# Patient Record
Sex: Female | Born: 1995 | Race: Black or African American | Hispanic: No | Marital: Single | Smoking: Never smoker
Health system: Southern US, Community
[De-identification: ages and names within clinical notes are randomized; demographics above are authoritative.]

## PROBLEM LIST (undated history)

## (undated) ENCOUNTER — Inpatient Hospital Stay (HOSPITAL_COMMUNITY): Payer: Self-pay

## (undated) DIAGNOSIS — A749 Chlamydial infection, unspecified: Secondary | ICD-10-CM

## (undated) DIAGNOSIS — A599 Trichomoniasis, unspecified: Secondary | ICD-10-CM

## (undated) DIAGNOSIS — K661 Hemoperitoneum: Secondary | ICD-10-CM

## (undated) DIAGNOSIS — O1400 Mild to moderate pre-eclampsia, unspecified trimester: Secondary | ICD-10-CM

## (undated) DIAGNOSIS — O008 Other ectopic pregnancy without intrauterine pregnancy: Secondary | ICD-10-CM

## (undated) HISTORY — PX: SALPINGOOPHORECTOMY: SHX82

## (undated) HISTORY — DX: Mild to moderate pre-eclampsia, unspecified trimester: O14.00

## (undated) HISTORY — DX: Chlamydial infection, unspecified: A74.9

## (undated) HISTORY — DX: Other ectopic pregnancy without intrauterine pregnancy: O00.80

## (undated) HISTORY — DX: Trichomoniasis, unspecified: A59.9

---
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
## 2013-02-01 ENCOUNTER — Encounter (HOSPITAL_COMMUNITY): Payer: Self-pay | Admitting: Emergency Medicine

## 2013-02-01 ENCOUNTER — Emergency Department (HOSPITAL_COMMUNITY)
Admission: EM | Admit: 2013-02-01 | Discharge: 2013-02-01 | Disposition: A | Discharge status: Home / Self Care | Payer: Medicaid Other | Attending: Emergency Medicine | Admitting: Emergency Medicine

## 2013-02-01 ENCOUNTER — Emergency Department (HOSPITAL_COMMUNITY): Payer: Medicaid Other

## 2013-02-01 DIAGNOSIS — J069 Acute upper respiratory infection, unspecified: Secondary | ICD-10-CM

## 2013-02-01 DIAGNOSIS — R52 Pain, unspecified: Secondary | ICD-10-CM | POA: Insufficient documentation

## 2013-02-01 DIAGNOSIS — H9209 Otalgia, unspecified ear: Secondary | ICD-10-CM | POA: Insufficient documentation

## 2013-02-01 DIAGNOSIS — R000 Tachycardia, unspecified: Secondary | ICD-10-CM | POA: Insufficient documentation

## 2013-02-01 LAB — RAPID STREP SCREEN (MED CTR MEBANE ONLY): Streptococcus, Group A Screen (Direct): NEGATIVE

## 2013-02-01 MED ORDER — SODIUM CHLORIDE 0.9 % IV BOLUS (SEPSIS)
1000.0000 mL | Freq: Once | INTRAVENOUS | Status: AC
  Administered 2013-02-01: 1000 mL via INTRAVENOUS

## 2013-02-01 MED ORDER — IBUPROFEN 800 MG PO TABS
800.0000 mg | ORAL_TABLET | Freq: Once | ORAL | Status: AC
  Administered 2013-02-01: 800 mg via ORAL
  Filled 2013-02-01: qty 1

## 2013-02-01 NOTE — ED Provider Notes (Signed)
CSN: [id number]     Arrival date & time 02/01/13  1412 History   First MD Initiated Contact with Patient 02/01/13 1428     Chief Complaint  Patient presents with  . Sore Throat  . Otalgia  . Generalized Body Aches  . Chills   (Consider location/radiation/quality/duration/timing/severity/associated sxs/prior Treatment) Patient is a 17 y.o. female presenting with pharyngitis and ear pain. The history is provided by the patient and medical records.  Sore Throat Associated symptoms include chills, congestion, coughing, a fever and a sore throat.  Otalgia Associated symptoms: congestion, cough, fever and sore throat    This is an 17 year old female with no significant past medical history, presenting to the ED for her URI-like symptoms-- sore throat, productive cough, subjective fever, chills, generalized body aches, and bilateral otalgia for the past 3 days. Patient admits to recent sick contacts.  She denies any chest pain, shortness of breath, or palpitations. She denies any abdominal pain, nausea, or vomiting. No urinary symptoms.    Past Medical History  Diagnosis Date  . Pregnancy induced hypertension   . Mild preeclampsia 10/07/2012  . Insufficient prenatal care in third trimester 09/03/2012    Lapsed prenatal care. Records being transferred, got care at [facility], awaiting records Genetic Screen Will await records  Anatomic [place] Normal, HC<3rd%ile  Repeat:  Glucose Screen 79  GBS   Feeding Preference Bottle  Contraception Unsure  Circumcision      . NSVD (normal spontaneous vaginal delivery) 10/06/2012   History reviewed. No pertinent past surgical history. Family History  Problem Relation Age of Onset  . Cancer Maternal Grandmother     lung   History  Substance Use Topics  . Smoking status: Never Smoker   . Smokeless tobacco: Never Used  . Alcohol Use: No   OB History   Grav Para Term Preterm Abortions TAB SAB Ect Mult Living   2 1 1  0 1 0 1 0 0 1     Review of Systems   Constitutional: Positive for fever and chills.  HENT: Positive for congestion, ear pain and sore throat.   Respiratory: Positive for cough.   All other systems reviewed and are negative.    Allergies  Review of patient's allergies indicates no known allergies.  Home Medications  No current outpatient prescriptions on file. BP 123/79  Pulse 144  Temp(Src) 99.7 F (37.6 C) (Oral)  Resp 17  Ht 5\' 4"  (1.626 m)  Wt 90 lb (40.824 kg)  BMI 15.44 kg/m2  SpO2 100%  LMP 01/18/2013  Physical Exam  Nursing note and vitals reviewed. Constitutional: She is oriented to person, place, and time. She appears well-developed and well-nourished. No distress.  HENT:  Head: Normocephalic and atraumatic.  Right Ear: Tympanic membrane and ear canal normal.  Left Ear: Tympanic membrane and ear canal normal.  Nose: Mucosal edema present.  Mouth/Throat: Uvula is midline, oropharynx is clear and moist and mucous membranes are normal. No trismus in the jaw. No uvula swelling. No oropharyngeal exudate, posterior oropharyngeal edema, posterior oropharyngeal erythema or tonsillar abscesses.  Turbinates swollen and erythematous; tonsils 1+ bilaterally without exudate; uvula midline, no peritonsillar abscess; handling secretions appropriately, no difficulty swallowing  Eyes: Conjunctivae and EOM are normal. Pupils are equal, round, and reactive to light.  Neck: Normal range of motion. Neck supple.  Cardiovascular: Regular rhythm and normal heart sounds.  Tachycardia present.   Pulmonary/Chest: Effort normal and breath sounds normal. No respiratory distress. She has no wheezes.  Lungs CTAB  Abdominal: Soft. Bowel sounds are normal. There is no tenderness. There is no guarding.  Musculoskeletal: Normal range of motion.  Neurological: She is alert and oriented to person, place, and time.  Skin: Skin is warm and dry. She is not diaphoretic.  Psychiatric: She has a normal mood and affect.    ED Course   Procedures (including critical care time) Labs Review Labs Reviewed  RAPID STREP SCREEN   Imaging Review Dg Chest 2 View  02/01/2013   CLINICAL DATA:  Sore throat.  Ear pain.  Tachycardia.  EXAM: CHEST  2 VIEW  COMPARISON:  None.  FINDINGS: The heart size and mediastinal contours are within normal limits. The lungs are hyperinflated but clear. The visualized skeletal structures are unremarkable.  IMPRESSION: 1. No pneumonia. 2. Hyperinflation.   Electronically Signed   By: Signa Kell M.D.   On: 02/01/2013 16:25    EKG Interpretation   None       MDM   1. Viral URI with cough    Overall, pt non-toxic appearing.  Febrile and tachycardic on arrival, responsive to fluids and motrin.  Rapid strep negative.  CXR without acute disease.  Pts O2 sats have remained stable on room air.  Constellation of sx likely due to viral URI.  Instructed may use OTC cough suppressants as needed as well as chloraseptic spray for sore throat.  Drink plenty of fluids to keep self hydrated.  Return to the ED for new or worsening symptoms.  Garlon Hatchet, PA-C 02/01/13 1635

## 2013-02-01 NOTE — ED Provider Notes (Signed)
Medical screening examination/treatment/procedure(s) were performed by non-physician practitioner and as supervising physician I was immediately available for consultation/collaboration.  EKG Interpretation   None         Darlys Gales, MD 02/01/13 [phone]

## 2013-02-03 LAB — CULTURE, GROUP A STREP

## 2013-03-07 ENCOUNTER — Ambulatory Visit: Payer: Medicaid Other | Admitting: Pediatrics

## 2013-03-14 ENCOUNTER — Ambulatory Visit: Payer: Medicaid Other | Admitting: Pediatrics

## 2013-03-19 ENCOUNTER — Ambulatory Visit: Payer: Medicaid Other | Admitting: Pediatrics

## 2013-05-30 ENCOUNTER — Ambulatory Visit (INDEPENDENT_AMBULATORY_CARE_PROVIDER_SITE_OTHER): Payer: Medicaid Other | Admitting: Pediatrics

## 2013-05-30 ENCOUNTER — Encounter: Payer: Self-pay | Admitting: Pediatrics

## 2013-05-30 VITALS — BP 116/80 | HR 72 | Temp 98.7°F | Wt 86.4 lb

## 2013-05-30 DIAGNOSIS — A749 Chlamydial infection, unspecified: Secondary | ICD-10-CM

## 2013-05-30 DIAGNOSIS — Z113 Encounter for screening for infections with a predominantly sexual mode of transmission: Secondary | ICD-10-CM

## 2013-05-30 DIAGNOSIS — Z309 Encounter for contraceptive management, unspecified: Secondary | ICD-10-CM

## 2013-05-30 DIAGNOSIS — Z3202 Encounter for pregnancy test, result negative: Secondary | ICD-10-CM

## 2013-05-30 LAB — POCT URINE PREGNANCY: Preg Test, Ur: NEGATIVE

## 2013-05-30 MED ORDER — LEVONORGESTREL 0.75 MG PO TABS: ORAL_TABLET | ORAL | Status: DC

## 2013-05-30 NOTE — Progress Notes (Signed)
Subjective:     Patient ID: Brittney Holmes, female   DOB: May 13, 1995, 18 y.o.   MRN: [id number]  Possible Pregnancy This is a new problem. The current episode started in the past 7 days. Associated symptoms include a rash (arms, pubic area, and legs). Pertinent negatives include no abdominal pain, chills, coughing, fever or sore throat.   [name] [facility] She is worried that she may be pregnant and may have another STI.   Last sexual encounter was less than 1 week ago. Used protection, but condom broke. Not on birth control and has an infant. Does not want to be pregnant.   Has had gonorrhea and chlamydia in the last 6 months. Was treated in clinic. Did not have a test of cure.   Denies abnormal discharge and dyspareunia.   Last menses was 3-4 weeks ago.  Review of Systems  Constitutional: Negative for fever and chills.  HENT: Negative for sore throat.   Respiratory: Negative for cough.   Gastrointestinal: Negative for abdominal pain.  Genitourinary: Positive for genital sores. Negative for dysuria, frequency, hematuria, decreased urine volume, vaginal bleeding, vaginal discharge, difficulty urinating, vaginal pain, menstrual problem, pelvic pain and dyspareunia.  Skin: Positive for rash (arms, pubic area, and legs).  All other systems reviewed and are negative.       Objective:   Physical Exam  Nursing note and vitals reviewed. Constitutional: She is oriented to person, place, and time. She appears well-developed and well-nourished. No distress.  Exam performed with clinic volunteer as chaperone  HENT:  Head: Atraumatic.  Nose: Nose normal.  Mouth/Throat: No oropharyngeal exudate.  Eyes: Conjunctivae and EOM are normal.  Neck: Normal range of motion. Neck supple.  Cardiovascular: Normal rate and regular rhythm.   No murmur heard. Pulmonary/Chest: Effort normal and breath sounds normal. No respiratory distress.  Abdominal: Soft. Bowel sounds are  normal. She exhibits no distension.  Genitourinary: Vagina normal. No vaginal discharge found.  Shaved pubic hairs. Skin is dry. There are scattered flesh colored papules consistent with folliculitis, no obvious lesions, no odors  Musculoskeletal: Normal range of motion.  Lymphadenopathy:    She has no cervical adenopathy.  Neurological: She is alert and oriented to person, place, and time. No cranial nerve deficit. She exhibits normal muscle tone.  Skin: Skin is warm. Rash (there are few (<5) scattered papules on her thighs and several on her wrist) noted. No erythema.      Assessment and Plan:      1. Routine screening for STI (sexually transmitted infection) - HIV Antibody ( Reflex) - POCT urine pregnancy - negative - levonorgestrel (PLAN B) 0.75 MG tablet; Take two 0.75mg  tablets once after unprotected sex.  Dispense: 2 tablet; Refill: 1 - GC/chlamydia probe amp, urine - positive for chlamydia  2. Chlamydia infection - azithromycin 1g x 1 - please see telephone note for further details - [facility] and safer sex practices  3. Contraceptive management - scheduled follow up with Dr. Delorse LekMartha Perry for long-term birth control. Discussed options such as Nexplanon and provided with information - she would like Nexplanon.   Renne CriglerJalan W Jaemarie Hochberg MD, MPH, PGY-3

## 2013-05-30 NOTE — Patient Instructions (Signed)
You were seen for possible pregnancy. Your pregnancy test is negative. We are getting an HIV test and a urine gonorrhea and chlamydia test and will call you if your results are positive.   Please make sure to use a condom every time you have sex. I am prescribing Plan B - a pill that prevents pregnancy after you have sex. Take the pills as soon as possible after unprotected sex.   The small bumps you have in your pubic area are called folliculitis - it's best that folks with curly hair only trim but do not shave their pubic hair.   Folliculitis  Folliculitis is redness, soreness, and swelling (inflammation) of the hair follicles. This condition can occur anywhere on the body. People with weakened immune systems, diabetes, or obesity have a greater risk of getting folliculitis. CAUSES  Bacterial infection. This is the most common cause.  Fungal infection.  Viral infection.  Contact with certain chemicals, especially oils and tars. Long-term folliculitis can result from bacteria that live in the nostrils. The bacteria may trigger multiple outbreaks of folliculitis over time. SYMPTOMS Folliculitis most commonly occurs on the scalp, thighs, legs, back, buttocks, and areas where hair is shaved frequently. An early sign of folliculitis is a small, white or yellow, pus-filled, itchy lesion (pustule). These lesions appear on a red, inflamed follicle. They are usually less than 0.2 inches (5 mm) wide. When there is an infection of the follicle that goes deeper, it becomes a boil or furuncle. A group of closely packed boils creates a larger lesion (carbuncle). Carbuncles tend to occur in hairy, sweaty areas of the body. DIAGNOSIS  Your caregiver can usually tell what is wrong by doing a physical exam. A sample may be taken from one of the lesions and tested in a lab. This can help determine what is causing your folliculitis. TREATMENT  Treatment may include:  Applying warm compresses to the affected  areas.  Taking antibiotic medicines orally or applying them to the skin.  Draining the lesions if they contain a large amount of pus or fluid.  Laser hair removal for cases of long-lasting folliculitis. This helps to prevent regrowth of the hair. HOME CARE INSTRUCTIONS  Apply warm compresses to the affected areas as directed by your caregiver.  If antibiotics are prescribed, take them as directed. Finish them even if you start to feel better.  You may take over-the-counter medicines to relieve itching.  Do not shave irritated skin.  Follow up with your caregiver as directed. SEEK IMMEDIATE MEDICAL CARE IF:   You have increasing redness, swelling, or pain in the affected area.  You have a fever. MAKE SURE YOU:  Understand these instructions.  Will watch your condition.  Will get help right away if you are not doing well or get worse. Document Released: 04/25/2001 Document Revised: 08/16/2011 Document Reviewed: 05/17/2011 [facility] Patient Information 2014 [employer] [place].   Please see Dr. Delorse LekMartha Perry as soon as possible to have your Nexplanon inserted - it lasts for 3 years.

## 2013-05-31 ENCOUNTER — Encounter: Payer: Self-pay | Admitting: Pediatrics

## 2013-05-31 ENCOUNTER — Telehealth: Payer: Self-pay | Admitting: Pediatrics

## 2013-05-31 DIAGNOSIS — A749 Chlamydial infection, unspecified: Secondary | ICD-10-CM | POA: Insufficient documentation

## 2013-05-31 HISTORY — DX: Chlamydial infection, unspecified: A74.9

## 2013-05-31 LAB — GC/CHLAMYDIA PROBE AMP, URINE
CHLAMYDIA, SWAB/URINE, PCR: POSITIVE — AB
GC PROBE AMP, URINE: NEGATIVE

## 2013-05-31 MED ORDER — AZITHROMYCIN 200 MG/5ML PO SUSR: 1000.0000 mg | Freq: Once | ORAL | Status: DC

## 2013-05-31 MED ORDER — ONDANSETRON 8 MG PO TBDP: 8.0000 mg | ORAL_TABLET | Freq: Once | ORAL | Status: DC

## 2013-05-31 NOTE — Telephone Encounter (Signed)
I called Brittney Holmes about her positive chlamydia results. Her pregnancy test yesterday was negative.   She is feeling okay and opted to have home treatment. She states she cannot take pills and prefers oral solution.   1. Chlamydia - azithromycin (ZITHROMAX) 200 MG/5ML suspension; Take 25 mLs (1,000 mg total) by mouth once.  Dispense: 30 mL; Refill: 0 - ondansetron (ZOFRAN ODT) 8 MG disintegrating tablet; Take 1 tablet (8 mg total) by mouth once. Take prior to azithromycin.  Dispense: 1 tablet; Refill: 0 - sexual partner is 23yo. She will inform him of the need to be tested/ treated.  - no unprotected sex until partner is treated  She will practice taking pills. I encouraged her to start with M&Ms and icecream to help with pill-swallowing practice.   She has 4/24 follow up with Brittney Holmes.   Brittney CriglerJalan W Christohper Dube MD, MPH, PGY-3

## 2013-06-01 NOTE — Progress Notes (Signed)
I reviewed with the resident the medical history and the resident's findings on physical examination. I discussed with the resident the patient's diagnosis and agree with the treatment plan as documented in the resident's note.  Armanda Forand R, MD  

## 2013-06-21 ENCOUNTER — Encounter: Payer: Self-pay | Admitting: Pediatrics

## 2013-06-21 ENCOUNTER — Ambulatory Visit (INDEPENDENT_AMBULATORY_CARE_PROVIDER_SITE_OTHER): Payer: Medicaid Other | Admitting: Pediatrics

## 2013-06-21 VITALS — BP 110/78 | Ht 62.5 in | Wt 86.4 lb

## 2013-06-21 DIAGNOSIS — A749 Chlamydial infection, unspecified: Secondary | ICD-10-CM

## 2013-06-21 DIAGNOSIS — Z30017 Encounter for initial prescription of implantable subdermal contraceptive: Secondary | ICD-10-CM

## 2013-06-21 DIAGNOSIS — Z113 Encounter for screening for infections with a predominantly sexual mode of transmission: Secondary | ICD-10-CM

## 2013-06-21 NOTE — Patient Instructions (Signed)
Follow-up with Dr. Turon Kilmer in 1 month. Schedule this appointment before you leave clinic today.  Congratulations on getting your Nexplanon placement!  Below is some important information about Nexplanon.  First remember that Nexplanon does not prevent sexually transmitted infections.  Condoms will help prevent sexually transmitted infections. The Nexplanon starts working 7 days after it was inserted.  There is a risk of getting pregnant if you have unprotected sex in those first 7 days after placement of the Nexplanon.  The Nexplanon lasts for 3 years but can be removed at any time.  You can become pregnant as early as 1 week after removal.  You can have a new Nexplanon put in after the old one is removed if you like.  It is not known whether Nexplanon is as effective in women who are very overweight because the studies did not include many overweight women.  Nexplanon interacts with some medications, including barbiturates, bosentan, carbamazepine, felbamate, griseofulvin, oxcarbazepine, phenytoin, rifampin, St. John's wort, topiramate, HIV medicines.  Please alert your doctor if you are on any of these medicines.  Always tell other healthcare providers that you have a Nexplanon in your arm.  The Nexplanon was placed just under the skin.  Leave the outside bandage on for 24 hours.  Leave the smaller bandage on for 3-5 days or until it falls off on its own.  Keep the area clean and dry for 3-5 days. There is usually bruising or swelling at the insertion site for a few days to a week after placement.  If you see redness or pus draining from the insertion site, call us immediately.  Keep your user card with the date the implant was placed and the date the implant is to be removed.  The most common side effect is a change in your menstrual bleeding pattern.   This bleeding is generally not harmful to you but can be annoying.  Call or come in to see us if you have any concerns about the bleeding or if  you have any side effects or questions.    We will call you in 1 week to check in and we would like you to return to the clinic for a follow-up visit in 1 month.  You can call [facility] 24 hours a day with any questions or concerns.  There is always a nurse or doctor available to take your call.  Call 9-1-1 if you have a life-threatening emergency.  For anything else, please call us at [phone] before heading to the ER.  

## 2013-06-21 NOTE — Progress Notes (Signed)
Adolescent Medicine Consultation Follow-Up Visit Brittney Holmes  is a 18 y.o. female here today for follow-up of contraceptive management.   PCP Confirmed?  yes  Laurelle Skiver, [name], MD   History was provided by the patient.  Chart review:  Last seen by Dr. Marina GoodellPerry on 11/14/12.  Treatment plan at last visit included treatment for GC/CT, start nuvaring for birth control, f/u for clinic soon to encourage LARC use.   Patient's last menstrual period was 05/21/2013.  Last STI screen:  Component     Latest Ref Rng 05/30/2013  Chlamydia, Swab/Urine, PCR     NEGATIVE POSITIVE (A)  GC Probe Amp, Urine     NEGATIVE NEGATIVE   HPI:  Pt reports no concerns.  Reviewed positive chlamydia test 05/30/13, pt received treatment.  Pt denies any symptoms.  Pt interested in LARC placement today.  After discussion regarding options, pt will have Nexplanon placed.  Last unprotected sex:  1 month ago  ROS not indicated  Current Outpatient Prescriptions on File Prior to Visit  Medication Sig Dispense Refill  . azithromycin (ZITHROMAX) 200 MG/5ML suspension Take 25 mLs (1,000 mg total) by mouth once.  30 mL  0  . levonorgestrel (PLAN B) 0.75 MG tablet Take two 0.75mg  tablets once after unprotected sex.  2 tablet  1  . ondansetron (ZOFRAN ODT) 8 MG disintegrating tablet Take 1 tablet (8 mg total) by mouth once. Take prior to azithromycin.  1 tablet  0   No current facility-administered medications on file prior to visit.    No Known Allergies  Patient Active Problem List   Diagnosis Date Noted  . Chlamydia infection 05/31/2013  . Chlamydia 05/30/2013  . Contraceptive management 11/02/2012  . Anemia 10/08/2012   Physical Exam:  Filed Vitals:   06/21/13 1649  BP: 110/78  Height: 5' 2.5" (1.588 m)  Weight: 86 lb 6.4 oz (39.191 kg)   BP 110/78  Ht 5' 2.5" (1.588 m)  Wt 86 lb 6.4 oz (39.191 kg)  BMI 15.54 kg/m2  LMP 05/21/2013 Body mass index: body mass index is 15.54 kg/(m^2). 56.4% systolic and  91.1% diastolic of BP percentile by age, sex, and height. 126/81 is approximately the 95th BP percentile reading.  Nexplanon Insertion  No contraindications for placement.  No liver disease, no unexplained vaginal bleeding, no h/o breast cancer, no h/o blood clots.  Patient's last menstrual period was 05/21/2013.  UHCG: NEG  Risks & benefits of Nexplanon discussed The nexplanon device [facility]. Packaging instructions supplied to patient Consent form signed  The patient denies any allergies to anesthetics or antiseptics.  Procedure: Pt was placed in supine position. Left arm was flexed at the elbow and externally rotated so that her wrist was parallel to her ear The medial epicondyle of the left arm was identified The insertions site was marked 8 cm proximal to the medial epicondyle The insertion site was cleaned with Betadine The area surrounding the insertion site was covered with a sterile drape 1% lidocaine was injected just under the skin at the insertion site extending 4 cm proximally. The sterile preloaded disposable Nexaplanon applicator was removed from the sterile packaging The applicator needle was inserted at a 30 degree angle at 8 cm proximal to the medial epicondyle as marked The applicator was lowered to a horizontal position and advanced just under the skin for the full length of the needle The slider on the applicator was retracted fully while the applicator remained in the same position,  then the applicator was removed. The implant was confirmed via palpation as being in position The implant position was demonstrated to the patient Pressure dressing was applied to the patient.  The patient was instructed to removed the pressure dressing in 24 hrs.  The patient was advised to move slowly from a supine to an upright position  The patient denied any concerns or complaints  The patient was instructed to schedule a follow-up appt in 1  month and to call sooner if any concerns.  The patient acknowledged agreement and understanding of the plan.

## 2013-06-22 LAB — GC/CHLAMYDIA PROBE AMP, URINE
Chlamydia, Swab/Urine, PCR: POSITIVE — AB
GC Probe Amp, Urine: NEGATIVE

## 2013-06-24 ENCOUNTER — Telehealth: Payer: Self-pay | Admitting: Pediatrics

## 2013-06-24 NOTE — Telephone Encounter (Signed)
Sherri with [facility] need to speak to you or Dr.Perry regarding an order placed for this patient. She can be reached at [phone] option #2

## 2013-06-25 LAB — HIV ANTIBODY (ROUTINE TESTING W REFLEX)

## 2013-06-25 LAB — RPR

## 2013-06-25 NOTE — Telephone Encounter (Signed)
Patient did not go to the lab to have the HIV/RPR drawn but order was sent.  I advised them to cancel them and we will re-order upon re-check.

## 2013-06-28 ENCOUNTER — Ambulatory Visit: Payer: Medicaid Other | Admitting: Pediatrics

## 2013-07-09 NOTE — Progress Notes (Signed)
Quick Note:  Pt has not shown yet for treatment of positive STD. Please call and remind her of need to come for treatment. We can call the prescription in if she cannot come in. She must not have any sexual contact for 1 week after treatment to ensure she does not get re-infected and does not infect someone else. Her partner should be treated as well. We can add on medication for her partner to her prescription if needed. ______

## 2013-07-10 ENCOUNTER — Telehealth: Payer: Self-pay | Admitting: *Deleted

## 2013-07-10 NOTE — Telephone Encounter (Signed)
Message copied by Coralee RudKITTRELL, Kiran Lapine N on Wed Jul 10, 2013 12:06 PM ------      Message from: Delorse LekPERRY, MARTHA F      Created: Tue Jul 09, 2013  9:51 AM       Pt has not shown yet for treatment of positive STD.  Please call and remind her of need to come for treatment.  We can call the prescription in if she cannot come in.  She must not have any sexual contact for 1 week after treatment to ensure she does not get re-infected and does not infect someone else.  Her partner should be treated as well.  We can add on medication for her partner to her prescription if needed. ------

## 2013-07-10 NOTE — Telephone Encounter (Signed)
Left message for pt to call back to discuss treatment.

## 2013-07-16 NOTE — Progress Notes (Signed)
Pt. Has f/u appt scheduled for 07/23/13. Have called patient and LVM asking patient to return call to office.

## 2013-07-23 ENCOUNTER — Ambulatory Visit: Payer: Self-pay | Admitting: Pediatrics

## 2013-07-25 ENCOUNTER — Telehealth: Payer: Self-pay | Admitting: Pediatrics

## 2013-07-25 NOTE — Telephone Encounter (Signed)
Angie from the [facility] called yesterday and wanted to know the lab results of the sti, and also wanted to know if she was treated for the issue?? Can you please cakl her and let her know please... Thanks [phone]

## 2013-07-25 NOTE — Telephone Encounter (Signed)
Left message that patient has not been treated.  She has failed to show for 2 appointments made by her to obtain treatment and has not responded to any of our phone calls.   Advised to call back with further questions.

## 2013-08-17 NOTE — Telephone Encounter (Signed)
Please call patient and call health department to verify that patient has been treated.  We have not heard back any confirmation about treatment.

## 2013-08-19 NOTE — Telephone Encounter (Signed)
[facility] showed for treatment.

## 2013-10-16 ENCOUNTER — Ambulatory Visit (INDEPENDENT_AMBULATORY_CARE_PROVIDER_SITE_OTHER): Payer: Medicaid Other | Admitting: Pediatrics

## 2013-10-16 DIAGNOSIS — A749 Chlamydial infection, unspecified: Secondary | ICD-10-CM

## 2013-10-16 DIAGNOSIS — Z975 Presence of (intrauterine) contraceptive device: Secondary | ICD-10-CM

## 2013-10-16 MED ORDER — AZITHROMYCIN 250 MG PO TABS: 1000.0000 mg | ORAL_TABLET | Freq: Once | ORAL | Status: DC

## 2013-10-16 NOTE — Progress Notes (Signed)
Adolescent Medicine Consultation Follow-Up Visit Brittney Holmes  is a 18 y.o. female here today for follow-up of positive chlamydia test.   PCP Confirmed?  yes  Brittney Holmes, Brittney ClosMARTHA FAIRBANKS, MD   History was provided by the patient.  Chart review:  Last seen by Dr. Marina Holmes on 06/21/13.  Treatment plan at last visit included insertion of implant.  She had tested positive previously for chlamydia and had been treated.  She was retested for chlamydia on 06/21/13 and the retest was positive.  We were unable to contact the patient to get treated.  Today she presented for an appt for her child and was directed to us by Dr. Renae Holmes.  Last STI screen:  Component     Latest Ref Rng 06/21/2013  Chlamydia, Swab/Urine, PCR     NEGATIVE POSITIVE (A)  GC Probe Amp, Urine     NEGATIVE NEGATIVE   HPI:  Pt reports no concerns.  She reports she was unaware of the positive chlamydia test.  She denies dysuria or pelvic pain.  She reports last sexual contact was 1 month ago with a condom.  She does not have contact with that partner any more.  Discussed partner notification.  No LMP recorded.  ROS per HPI  The following portions of the patient's history were reviewed and updated as appropriate: allergies, current medications and problem list.  No Known Allergies  Physical Exam:  There were no vitals filed for this visit. There were no vitals taken for this visit. Body mass index: body mass index is unknown because there is no weight on file. No blood pressure reading on file for this encounter.  Physical Exam  Constitutional: No distress.  Abdominal: Soft. She exhibits no distension and no mass. There is no tenderness.  Musculoskeletal:       Arms:   Assessment/Plan: 1. Chlamydia infection Retreated today after positive test from several months ago.  Reviewed importance of more consistent medical care.  Pt has transportation issues.  Discussed Medicaid transportation options. - azithromycin (ZITHROMAX) tablet  1,000 mg; Take 4 tablets (1,000 mg total) by mouth once.  2. Presence of subdermal contraceptive implant No side effects.  Well healed.  Reviewed importance of condoms.   Follow-up:  3 months for retest of chlamydia  Medical decision-making:  > 15 minutes spent, more than 50% of appointment was spent discussing diagnosis and management of symptoms

## 2013-12-30 ENCOUNTER — Encounter: Payer: Self-pay | Admitting: Pediatrics

## 2014-01-14 ENCOUNTER — Encounter: Payer: Self-pay | Admitting: Pediatrics

## 2014-01-14 NOTE — Progress Notes (Signed)
Pre-Visit Planning  Previous Psych Screenings:  None Psych Screenings Due: None  Review of previous notes:  Last seen by Dr. Marina GoodellPerry on 10/16/13.  Treatment plan at last visit included continue nexplanon, retreatment of positive chlamydia test.   Last CPE: None  Last STI screen:  Component     Latest Ref Rng 06/21/2013  Chlamydia, Swab/Urine, PCR     NEGATIVE POSITIVE (A)  GC Probe Amp, Urine     NEGATIVE NEGATIVE   Pertinent Labs: None  Immunizations Due: FLU, HPV#1, VZV#2  To Do at visit:   - Schedule CPE - Check GC/CT - HIV, RPR - Assess nexplanon side effects

## 2014-01-15 ENCOUNTER — Ambulatory Visit: Payer: Medicaid Other | Admitting: Pediatrics

## 2014-02-11 ENCOUNTER — Ambulatory Visit (INDEPENDENT_AMBULATORY_CARE_PROVIDER_SITE_OTHER): Payer: Medicaid Other | Admitting: Pediatrics

## 2014-02-11 ENCOUNTER — Telehealth: Payer: Self-pay

## 2014-02-11 ENCOUNTER — Encounter: Payer: Self-pay | Admitting: Pediatrics

## 2014-02-11 VITALS — BP 96/68 | Wt 88.6 lb

## 2014-02-11 DIAGNOSIS — A749 Chlamydial infection, unspecified: Secondary | ICD-10-CM

## 2014-02-11 DIAGNOSIS — A599 Trichomoniasis, unspecified: Secondary | ICD-10-CM

## 2014-02-11 DIAGNOSIS — Z224 Carrier of infections with a predominantly sexual mode of transmission: Secondary | ICD-10-CM

## 2014-02-11 DIAGNOSIS — N898 Other specified noninflammatory disorders of vagina: Secondary | ICD-10-CM

## 2014-02-11 LAB — POCT URINE PREGNANCY: Preg Test, Ur: NEGATIVE

## 2014-02-11 MED ORDER — AZITHROMYCIN 250 MG PO TABS: 1000.0000 mg | ORAL_TABLET | Freq: Once | ORAL | Status: DC

## 2014-02-11 MED ORDER — AZITHROMYCIN 200 MG/5ML PO SUSR
1000.0000 mg | Freq: Once | ORAL | Status: AC
  Administered 2014-02-11: 1000 mg via ORAL

## 2014-02-11 MED ORDER — CEFTRIAXONE SODIUM 1 G IJ SOLR
250.0000 mg | Freq: Once | INTRAMUSCULAR | Status: AC
  Administered 2014-02-11: 250 mg via INTRAMUSCULAR

## 2014-02-11 NOTE — Progress Notes (Signed)
Patient ID: Brittney Holmes, female   DOB: 1995-06-10, 18 y.o.   MRN: [id number]  Subjective:    Brittney Holmes is a 18 y.o. old female with a history of previous chlamydia infection here for Vaginal Itching  HPI  Symptoms started period 2 days prior.  She began her menstrual cycle, wore a pad, started "irritating".  She noted a dry/red rash more toward the posterior side of the vaginal, associated with pruritis.  Green discharge started yesterday, smells bad.  She reports that these symptoms are similar to those that she had when she was diagnosed with chlamydia earlier this year.  No abdominal pain, fevers, dysuria, CVA pain.  No discomfort when wiping.  Applied vaseline, improved itching.  She is currently sexually active, uses nexplanon for birth control.   She was diagnosed with chlamydia by urine NAA in April 2015, was contacted but remained unaware of the positive test.  She was again seen in August 2015, at which time she again tested positive for chlamydia.  She was treated with 1 time dose of azithromycin 1000 mg.    Review of Systems  History and Problem List: Brittney Holmes has Anemia; Presence of subdermal contraceptive implant; and Chlamydia infection on her problem list.  Brittney Holmes  has a past medical history of Pregnancy induced hypertension; Mild preeclampsia (10/07/2012); Insufficient prenatal care in third trimester (09/03/2012); NSVD (normal spontaneous vaginal delivery) (10/06/2012); Vaginal discharge (11/02/2012); Maternal iron deficiency anemia complicating pregnancy in third trimester (10/05/2012); and Social problem (08/12/2012).   Meds: none  Allergies: none   Immunizations needed: none     Objective:     BP 96/68 mmHg  Wt 40.2 kg (88 lb 10 oz)  LMP 02/09/2014 Physical Exam GEN: awake and alert, no acute distress  HEENT:  Normocephalic, atraumatic. Sclera clear. PERRLA. EOMI. Nares clear. Oropharynx non erythematous without lesions or exudates. Moist mucous membranes.  SKIN: No rashes or  jaundice.  PULM:  Unlabored respirations.  Clear to auscultation bilaterally with no wheezes or crackles.  No accessory muscle use. CARDIO:  Regular rate and rhythm.  No murmurs.  2+ radial pulses GI:  Soft, non tender, non distended.  Normoactive bowel sounds.  No masses.  No hepatosplenomegaly.   GU: normal external genital exam, no frank discharge noted, no tenderness with vaginal swab EXT: Warm and well perfused. No cyanosis or edema.  NEURO: Alert and oriented. No obvious focal deficits.     Assessment and Plan:     Brittney Holmes was seen today for vaginal discharge and itching.  She has previously been diagnosed with chlamydia in April 2015.  Practitioners were unable to make contact with the patient again until August 2015, at which time she again tested positive for chlamydia and received treatment with Azithromycin.  She now presents with foul smelling vaginal discharge and itching, similar to her previous presentation with chlamydia.  She has no abdominal or pelvic pain. Given that she was previously lost to follow up for 4 months with untreated chlamydia, I will proceed with treating for gonorrhea and chlamydia in the clinic today.  Differential diagnosis also includes candidal infection, bacterial vaginosis, trichomonas.  Patient also needs screening for HIV and RPR (not obtained in the last year).  - Urine pregnancy, urine gonorrhea/chlamydia, wet prep genital swab - Serum RPR, HIV antibody - Will contact patient to arrange partner treatment once results are obtained  - Will transition to family practice, [facility] and placed her on waiting list for new patients  Eusebio MeSara Millisa Giarrusso, MD [facility], PGY-1  02/11/14        I saw and evaluated the patient, performing the key elements of the service. I developed the management plan that is described in the resident's note, and I agree with the content.   [facility] Center LLCNAGAPPAN,SURESH                  02/12/2014,  4:02 PM

## 2014-02-11 NOTE — Telephone Encounter (Signed)
A user error has taken place: encounter opened in error, closed for administrative reasons.

## 2014-02-11 NOTE — Patient Instructions (Signed)
We will contact you if any of our other testing is positive.  Your partners will need to be treated if testing for gonorrhea or chlamydia is positive.  Your partner can either go to the health department or we can call in treatment.    Please come back to clinic if you develop severe abdominal pain, fevers, back pain.   Chlamydia Chlamydia is an infection. It is spread through sexual contact. Chlamydia can be in different areas of the body. These areas include the cervix, urethra, throat, or rectum. You may not know you have chlamydia because many people never develop the symptoms. Chlamydia is not difficult to treat once you know you have it. However, if it is left untreated, chlamydia can lead to more serious health problems.  CAUSES  Chlamydia is caused by bacteria. It is a sexually transmitted disease. It is passed from an infected partner during intimate contact. This contact could be with the genitals, mouth, or rectal area. Chlamydia can also be passed from mothers to babies during birth. SIGNS AND SYMPTOMS  There may not be any symptoms. This is often the case early in the infection. If symptoms develop, they may include:  Mild pain and discomfort when urinating.  Redness, soreness, and swelling (inflammation) of the rectum.  Vaginal discharge.  Painful intercourse.  Abdominal pain.  Bleeding between menstrual periods. DIAGNOSIS  To diagnose this infection, your health care provider will do a pelvic exam. Cultures will be taken of the vagina, cervix, urine, and possibly the rectum to verify the diagnosis.  TREATMENT You will be given antibiotic medicines. If you are pregnant, certain types of antibiotics will need to be avoided. Any sexual partners should also be treated, even if they do not show symptoms.  HOME CARE INSTRUCTIONS   Take your antibiotic medicine as directed by your health care provider. Finish the antibiotic even if you start to feel better.  Take medicines only  as directed by your health care provider.  Inform any sexual partners about the infection. They should also be treated.  Do not have sexual contact until your health care provider tells you it is okay.  Get plenty of rest.  Eat a well-balanced diet.  Drink enough fluids to keep your urine clear or pale yellow.  Keep all follow-up visits as directed by your health care provider. SEEK MEDICAL CARE IF:  You have painful urination.  You have abdominal pain.  You have vaginal discharge.  You have painful sexual intercourse.  You have bleeding between periods and after sex.  You have a fever. SEEK IMMEDIATE MEDICAL CARE IF:   You experience nausea or vomiting.  You experience excessive sweating (diaphoresis).  You have difficulty swallowing. MAKE SURE YOU:   Understand these instructions.  Will watch your condition.  Will get help right away if you are not doing well or get worse. Document Released: 11/24/2004 Document Revised: 07/01/2013 Document Reviewed: 10/22/2012 [facility] Patient Information 2015 [place], [place]. This information is not intended to replace advice given to you by your health care provider. Make sure you discuss any questions you have with your health care provider.

## 2014-02-12 LAB — RPR

## 2014-02-12 LAB — HIV ANTIBODY (ROUTINE TESTING W REFLEX): HIV 1&2 Ab, 4th Generation: NONREACTIVE

## 2014-02-12 LAB — GC/CHLAMYDIA PROBE AMP, URINE
Chlamydia, Swab/Urine, PCR: POSITIVE — AB
GC Probe Amp, Urine: NEGATIVE

## 2014-02-13 ENCOUNTER — Telehealth: Payer: Self-pay | Admitting: Pediatrics

## 2014-02-13 LAB — WET PREP BY MOLECULAR PROBE
Candida species: NEGATIVE
Gardnerella vaginalis: NEGATIVE
Trichomonas vaginosis: POSITIVE — AB

## 2014-02-13 NOTE — Telephone Encounter (Signed)
Wet prep results show + trichomonas  Also, urine GC/chlamydia is positive for chlamydia  Patient was treated in clinic (directly observed) on 12/15 with 1 gram of azithromycin and 250 mg CTX which is sufficient for chlamydia/GC  She needs treatment for trichomonas. Attempted to call preferred home # with no answer. Will try to call again.

## 2014-02-14 ENCOUNTER — Encounter: Payer: Self-pay | Admitting: Pediatrics

## 2014-02-14 ENCOUNTER — Encounter: Payer: Self-pay | Admitting: Pediatric Endocrinology

## 2014-02-14 ENCOUNTER — Telehealth: Payer: Self-pay | Admitting: Pediatric Endocrinology

## 2014-02-14 DIAGNOSIS — A599 Trichomoniasis, unspecified: Secondary | ICD-10-CM | POA: Insufficient documentation

## 2014-02-14 HISTORY — DX: Trichomoniasis, unspecified: A59.9

## 2014-02-14 MED ORDER — METRONIDAZOLE 500 MG PO TABS: 2000.0000 mg | ORAL_TABLET | Freq: Once | ORAL | Status: DC

## 2014-02-14 NOTE — Addendum Note (Signed)
Addended byHenrietta Hoover: Shemiah Rosch on: 02/14/2014 03:00 PM   Modules accepted: Orders

## 2014-02-14 NOTE — Addendum Note (Signed)
Addended byHenrietta Hoover: Assad Harbeson on: 02/14/2014 02:11 PM   Modules accepted: Orders

## 2014-05-23 ENCOUNTER — Encounter (HOSPITAL_COMMUNITY): Payer: Self-pay | Admitting: *Deleted

## 2014-05-23 DIAGNOSIS — Z3202 Encounter for pregnancy test, result negative: Secondary | ICD-10-CM | POA: Diagnosis not present

## 2014-05-23 DIAGNOSIS — R1030 Lower abdominal pain, unspecified: Secondary | ICD-10-CM | POA: Diagnosis present

## 2014-05-23 DIAGNOSIS — N12 Tubulo-interstitial nephritis, not specified as acute or chronic: Secondary | ICD-10-CM | POA: Insufficient documentation

## 2014-05-23 DIAGNOSIS — Z8742 Personal history of other diseases of the female genital tract: Secondary | ICD-10-CM | POA: Diagnosis not present

## 2014-05-23 LAB — COMPREHENSIVE METABOLIC PANEL
ALK PHOS: 82 U/L (ref 39–117)
ALT: 9 U/L (ref 0–35)
AST: 21 U/L (ref 0–37)
Albumin: 4.5 g/dL (ref 3.5–5.2)
Anion gap: 8 (ref 5–15)
BILIRUBIN TOTAL: 2.2 mg/dL — AB (ref 0.3–1.2)
BUN: 9 mg/dL (ref 6–23)
CHLORIDE: 105 mmol/L (ref 96–112)
CO2: 24 mmol/L (ref 19–32)
Calcium: 10.2 mg/dL (ref 8.4–10.5)
Creatinine, Ser: 0.71 mg/dL (ref 0.50–1.10)
GFR calc Af Amer: >90 mL/min (ref >90)
GFR calc non Af Amer: >90 mL/min (ref >90)
Glucose, Bld: 94 mg/dL (ref 70–99)
Potassium: 3.9 mmol/L (ref 3.5–5.1)
SODIUM: 137 mmol/L (ref 135–145)
Total Protein: 8.4 g/dL — ABNORMAL HIGH (ref 6.0–8.3)

## 2014-05-23 LAB — CBC WITH DIFFERENTIAL/PLATELET
Basophils Absolute: 0.1 10*3/uL (ref 0.0–0.1)
Basophils Relative: 1 % (ref 0–1)
EOS ABS: 0.4 10*3/uL (ref 0.0–0.7)
EOS PCT: 3 % (ref 0–5)
HCT: 41.8 % (ref 36.0–46.0)
Hemoglobin: 13.6 g/dL (ref 12.0–15.0)
LYMPHS ABS: 2.1 10*3/uL (ref 0.7–4.0)
LYMPHS PCT: 15 % (ref 12–46)
MCH: 27.7 pg (ref 26.0–34.0)
MCHC: 32.5 g/dL (ref 30.0–36.0)
MCV: 85.1 fL (ref 78.0–100.0)
MONO ABS: 0.8 10*3/uL (ref 0.1–1.0)
Monocytes Relative: 6 % (ref 3–12)
NEUTROS PCT: 75 % (ref 43–77)
Neutro Abs: 10.4 10*3/uL — ABNORMAL HIGH (ref 1.7–7.7)
PLATELETS: 432 10*3/uL — AB (ref 150–400)
RBC: 4.91 MIL/uL (ref 3.87–5.11)
RDW: 12.6 % (ref 11.5–15.5)
WBC: 13.6 10*3/uL — ABNORMAL HIGH (ref 4.0–10.5)

## 2014-05-23 LAB — URINE MICROSCOPIC-ADD ON

## 2014-05-23 LAB — URINALYSIS, ROUTINE W REFLEX MICROSCOPIC
BILIRUBIN URINE: NEGATIVE
Glucose, UA: NEGATIVE mg/dL
Ketones, ur: 15 mg/dL — AB
NITRITE: POSITIVE — AB
Protein, ur: >300 mg/dL — AB
SPECIFIC GRAVITY, URINE: 1.023 (ref 1.005–1.030)
Urobilinogen, UA: 1 mg/dL (ref 0.0–1.0)
pH: 6 (ref 5.0–8.0)

## 2014-05-23 LAB — PREGNANCY, URINE: Preg Test, Ur: NEGATIVE

## 2014-05-23 LAB — LIPASE, BLOOD: LIPASE: 19 U/L (ref 11–59)

## 2014-05-23 NOTE — ED Notes (Signed)
The pt is c/o rt flank pain and lower abd pain for 2 days with painful urination no n v lmp none implant

## 2014-05-24 ENCOUNTER — Emergency Department (HOSPITAL_COMMUNITY)
Admission: EM | Admit: 2014-05-24 | Discharge: 2014-05-24 | Disposition: A | Discharge status: Home / Self Care | Payer: Medicaid Other | Attending: Emergency Medicine | Admitting: Emergency Medicine

## 2014-05-24 DIAGNOSIS — N12 Tubulo-interstitial nephritis, not specified as acute or chronic: Secondary | ICD-10-CM

## 2014-05-24 MED ORDER — CEFTRIAXONE SODIUM 1 G IJ SOLR
1.0000 g | Freq: Once | INTRAMUSCULAR | Status: AC
  Administered 2014-05-24: 1 g via INTRAVENOUS
  Filled 2014-05-24: qty 10

## 2014-05-24 MED ORDER — ONDANSETRON HCL 4 MG/2ML IJ SOLN
4.0000 mg | Freq: Once | INTRAMUSCULAR | Status: AC
  Administered 2014-05-24: 4 mg via INTRAVENOUS
  Filled 2014-05-24: qty 2

## 2014-05-24 MED ORDER — SODIUM CHLORIDE 0.9 % IV BOLUS (SEPSIS)
1000.0000 mL | Freq: Once | INTRAVENOUS | Status: AC
  Administered 2014-05-24: 1000 mL via INTRAVENOUS

## 2014-05-24 MED ORDER — MORPHINE SULFATE 4 MG/ML IJ SOLN
4.0000 mg | Freq: Once | INTRAMUSCULAR | Status: AC
  Administered 2014-05-24: 4 mg via INTRAVENOUS
  Filled 2014-05-24: qty 1

## 2014-05-24 MED ORDER — CEPHALEXIN 250 MG/5ML PO SUSR: 500.0000 mg | Freq: Two times a day (BID) | ORAL | Status: AC

## 2014-05-24 MED ORDER — ACETAMINOPHEN-CODEINE 120-12 MG/5ML PO SUSP: 10.0000 mL | Freq: Four times a day (QID) | ORAL | Status: DC | PRN

## 2014-05-24 MED ORDER — ONDANSETRON 4 MG PO TBDP: ORAL_TABLET | ORAL | Status: DC

## 2014-05-24 NOTE — ED Provider Notes (Signed)
CSN: [id number]     Arrival date & time 05/23/14  2143 History  This chart was scribed for Brittney Holmes Anvi Mangal, MD by Tanda RockersMargaux Venter, ED Scribe. This patient was seen in room B17C/B17C and the patient's care was started at 12:46 AM.    Chief Complaint  Patient presents with  . Abdominal Pain   The history is provided by the patient. No language interpreter was used.     HPI Comments: Brittney Holmes is a 19 y.o. female who presents to the Emergency Department complaining of gradual onset lower abdominal pain that began 2 days ago. Pt reports that the pain is radiating to the right flank. She also complains of hematuria that began 2 days ago as well. Pt also complains of dysuria and frequency. She denies nausea, vomiting, diarrhea, fever, chills, or any other symptoms. No vaginal symptoms   Past Medical History  Diagnosis Date  . Pregnancy induced hypertension   . Mild preeclampsia 10/07/2012  . Insufficient prenatal care in third trimester 09/03/2012    Lapsed prenatal care. Records being transferred, [facility], awaiting records Genetic Screen Will await records  Anatomic US Normal, HC<3rd%ile  Repeat:  Glucose Screen 79  GBS   Feeding Preference Bottle  Contraception Unsure  Circumcision      . NSVD (normal spontaneous vaginal delivery) 10/06/2012  . Vaginal discharge 11/02/2012  . Maternal iron deficiency anemia complicating pregnancy in third trimester 10/05/2012  . Social problem 08/12/2012    Homeless, she and mother temporarily living with FOB    History reviewed. No pertinent past surgical history. Family History  Problem Relation Age of Onset  . Cancer Maternal Grandmother     lung   History  Substance Use Topics  . Smoking status: Never Smoker   . Smokeless tobacco: Never Used  . Alcohol Use: No   OB History    Gravida Para Term Preterm AB TAB SAB Ectopic Multiple Living   2 1 1  0 1 0 1 0 0 1     Review of Systems  Constitutional: Negative for fever and chills.   Respiratory: Negative for cough and shortness of breath.   Cardiovascular: Negative for chest pain and leg swelling.  Gastrointestinal: Positive for abdominal pain. Negative for nausea, vomiting, diarrhea, constipation, blood in stool and anal bleeding.  Genitourinary: Positive for dysuria, frequency, hematuria and flank pain (Right flank). Negative for vaginal bleeding, vaginal discharge and pelvic pain.  Musculoskeletal: Positive for back pain. Negative for myalgias, neck pain and neck stiffness.  Skin: Negative for rash and wound.  Neurological: Negative for dizziness, weakness, light-headedness, numbness and headaches.  All other systems reviewed and are negative.     Allergies  Review of patient's allergies indicates no known allergies.  Home Medications   Prior to Admission medications   Medication Sig Start Date End Date Taking? Authorizing Provider  acetaminophen-codeine 120-12 MG/5ML suspension Take 10 mLs by mouth every 6 (six) hours as needed for pain. 05/24/14   Brittney Holmes Mikayla Chiusano, MD  cephALEXin (KEFLEX) 250 MG/5ML suspension Take 10 mLs (500 mg total) by mouth 2 (two) times daily. 05/24/14 06/10/14  Brittney Holmes Tahjae Durr, MD  metroNIDAZOLE (FLAGYL) 500 MG tablet Take 4 tablets (2,000 mg total) by mouth once. Patient not taking: Reported on 05/24/2014 02/14/14   Henrietta HooverSuresh Nagappan, MD  ondansetron [facility](ZOFRAN ODT) 4 MG disintegrating tablet 4mg  ODT q4 hours prn nausea/vomit 05/24/14   Brittney Holmes Amanada Philbrick, MD   Triage Vitals: BP 122/95 mmHg  Pulse 101  Temp(Src) 97.4 F (  36.3 C) (Oral)  Resp 18  Ht  (1.6 m)  Wt 83 lb 4.8 oz (37.785 kg)  BMI 14.76 kg/m2  SpO2 99%  LMP 05/23/2014   Physical Exam  Constitutional: She is oriented to person, place, and time. She appears well-developed and well-nourished. No distress.  HENT:  Head: Normocephalic and atraumatic.  Mouth/Throat: Oropharynx is clear and moist.  Eyes: EOM are normal. Pupils are equal, round, and reactive to light.  Neck:  Normal range of motion. Neck supple.  Cardiovascular: Normal rate and regular rhythm.   Pulmonary/Chest: Effort normal and breath sounds normal. No respiratory distress. She has no wheezes. She has no rales. She exhibits no tenderness.  Abdominal: Soft. Bowel sounds are normal. She exhibits no distension and no mass. There is tenderness (mild suprapubic tenderness with palpation.). There is no rebound and no guarding.  Musculoskeletal: Normal range of motion. She exhibits tenderness (right-sided CVA tenderness with percussion). She exhibits no edema.  Neurological: She is alert and oriented to person, place, and time.  Skin: Skin is warm and dry. No rash noted. No erythema.  Psychiatric: She has a normal mood and affect. Her behavior is normal.  Nursing note and vitals reviewed.   ED Course  Procedures (including critical care time)  DIAGNOSTIC STUDIES: Oxygen Saturation is 99% on RA, normal by my interpretation.    COORDINATION OF CARE: 12:49 AM-Discussed treatment plan which includes UA, CBC, CMP, Lipase, Urine pregnancy, UA, and prescription for antibiotics with pt at bedside and pt agreed to plan.   Labs Review Labs Reviewed  CBC WITH DIFFERENTIAL/PLATELET - Abnormal; Notable for the following:    WBC 13.6 (*)    Platelets 432 (*)    Neutro Abs 10.4 (*)    All other components within normal limits  COMPREHENSIVE METABOLIC PANEL - Abnormal; Notable for the following:    Total Protein 8.4 (*)    Total Bilirubin 2.2 (*)    All other components within normal limits  URINALYSIS, ROUTINE W REFLEX MICROSCOPIC - Abnormal; Notable for the following:    Color, Urine AMBER (*)    APPearance TURBID (*)    Hgb urine dipstick LARGE (*)    Ketones, ur 15 (*)    Protein, ur >300 (*)    Nitrite POSITIVE (*)    Leukocytes, UA MODERATE (*)    All other components within normal limits  URINE MICROSCOPIC-ADD ON - Abnormal; Notable for the following:    Bacteria, UA MANY (*)    All other  components within normal limits  LIPASE, BLOOD  PREGNANCY, URINE    Imaging Review No results found.   EKG Interpretation None      MDM   Final diagnoses:  Pyelonephritis    I personally performed the services described in this documentation, which was scribed in my presence. The recorded information has been reviewed and is accurate.  Currently the patient has a pyelonephritis. She is well-appearing with normal vital signs. We'll give IV Rocephin and IV fluids in the emergency department. Anticipate discharge home with PO antibiotics.     Brittney Racer, MD 05/24/14 [phone]

## 2014-05-24 NOTE — Discharge Instructions (Signed)
Pyelonephritis, Adult °Pyelonephritis is a kidney infection. In general, there are 2 main types of pyelonephritis: °· Infections that come on quickly without any warning (acute pyelonephritis). °· Infections that persist for a long period of time (chronic pyelonephritis). °CAUSES  °Two main causes of pyelonephritis are: °· Bacteria traveling from the bladder to the kidney. This is a problem especially in pregnant women. The urine in the bladder can become filled with bacteria from multiple causes, including: °¨ Inflammation of the prostate gland (prostatitis). °¨ Sexual intercourse in females. °¨ Bladder infection (cystitis). °· Bacteria traveling from the bloodstream to the tissue part of the kidney. °Problems that may increase your risk of getting a kidney infection include: °· Diabetes. °· Kidney stones or bladder stones. °· Cancer. °· Catheters placed in the bladder. °· Other abnormalities of the kidney or ureter. °SYMPTOMS  °· Abdominal pain. °· Pain in the side or flank area. °· Fever. °· Chills. °· Upset stomach. °· Blood in the urine (dark urine). °· Frequent urination. °· Strong or persistent urge to urinate. °· Burning or stinging when urinating. °DIAGNOSIS  °Your caregiver may diagnose your kidney infection based on your symptoms. A urine sample may also be taken. °TREATMENT  °In general, treatment depends on how severe the infection is.  °· If the infection is mild and caught early, your caregiver may treat you with oral antibiotics and send you home. °· If the infection is more severe, the bacteria may have gotten into the bloodstream. This will require intravenous (IV) antibiotics and a hospital stay. Symptoms may include: °¨ High fever. °¨ Severe flank pain. °¨ Shaking chills. °· Even after a hospital stay, your caregiver may require you to be on oral antibiotics for a period of time. °· Other treatments may be required depending upon the cause of the infection. °HOME CARE INSTRUCTIONS  °· Take your  antibiotics as directed. Finish them even if you start to feel better. °· Make an appointment to have your urine checked to make sure the infection is gone. °· Drink enough fluids to keep your urine clear or pale yellow. °· Take medicines for the bladder if you have urgency and frequency of urination as directed by your caregiver. °SEEK IMMEDIATE MEDICAL CARE IF:  °· You have a fever or persistent symptoms for more than 2-3 days. °· You have a fever and your symptoms suddenly get worse. °· You are unable to take your antibiotics or fluids. °· You develop shaking chills. °· You experience extreme weakness or fainting. °· There is no improvement after 2 days of treatment. °MAKE SURE YOU: °· Understand these instructions. °· Will watch your condition. °· Will get help right away if you are not doing well or get worse. °Document Released: 02/14/2005 Document Revised: 08/16/2011 Document Reviewed: 07/21/2010 °[employer]® Patient Information ©2015 [employer]. This information is not intended to replace advice given to you by your health care provider. Make sure you discuss any questions you have with your health care provider. ° °

## 2014-07-22 ENCOUNTER — Ambulatory Visit: Payer: Medicaid Other | Admitting: Pediatrics

## 2014-08-21 ENCOUNTER — Ambulatory Visit: Payer: Medicaid Other | Admitting: Pediatrics

## 2014-09-30 ENCOUNTER — Ambulatory Visit: Payer: Medicaid Other | Admitting: Pediatrics

## 2015-01-09 NOTE — Telephone Encounter (Signed)
Entered in error

## 2016-02-16 ENCOUNTER — Ambulatory Visit: Payer: Medicaid Other | Admitting: Obstetrics and Gynecology

## 2016-03-15 ENCOUNTER — Encounter: Payer: Self-pay | Admitting: Student in an Organized Health Care Education/Training Program

## 2016-03-15 ENCOUNTER — Other Ambulatory Visit (HOSPITAL_COMMUNITY)
Admission: RE | Admit: 2016-03-15 | Discharge: 2016-03-15 | Disposition: A | Discharge status: Home / Self Care | Payer: Medicaid Other | Source: Ambulatory Visit | Attending: Family Medicine | Admitting: Family Medicine

## 2016-03-15 ENCOUNTER — Ambulatory Visit (INDEPENDENT_AMBULATORY_CARE_PROVIDER_SITE_OTHER): Payer: Medicaid Other | Admitting: Student in an Organized Health Care Education/Training Program

## 2016-03-15 VITALS — BP 100/80 | HR 96 | Temp 98.2°F | Ht 63.0 in | Wt 93.0 lb

## 2016-03-15 DIAGNOSIS — B9689 Other specified bacterial agents as the cause of diseases classified elsewhere: Secondary | ICD-10-CM

## 2016-03-15 DIAGNOSIS — Z113 Encounter for screening for infections with a predominantly sexual mode of transmission: Secondary | ICD-10-CM | POA: Diagnosis not present

## 2016-03-15 DIAGNOSIS — Z124 Encounter for screening for malignant neoplasm of cervix: Secondary | ICD-10-CM | POA: Diagnosis not present

## 2016-03-15 DIAGNOSIS — Z0000 Encounter for general adult medical examination without abnormal findings: Secondary | ICD-10-CM

## 2016-03-15 DIAGNOSIS — N760 Acute vaginitis: Secondary | ICD-10-CM | POA: Diagnosis not present

## 2016-03-15 LAB — POCT WET PREP (WET MOUNT)
CLUE CELLS WET PREP WHIFF POC: NEGATIVE
TRICHOMONAS WET PREP HPF POC: ABSENT

## 2016-03-15 MED ORDER — METRONIDAZOLE 500 MG PO TABS: 500.0000 mg | ORAL_TABLET | Freq: Two times a day (BID) | ORAL | 0 refills | Status: DC

## 2016-03-15 MED ORDER — METRONIDAZOLE 0.75 % VA GEL
1.0000 | Freq: Two times a day (BID) | VAGINAL | 0 refills | Status: AC
Start: 2016-03-15 — End: 2016-03-20

## 2016-03-15 NOTE — Assessment & Plan Note (Signed)
-   Due for PAP, erformed today - Patient would like Nexplanon removed, will have her schedule for procedures clinic - Recommend inserting new nexplanon at that time - patient will discuss with her mom and decide.

## 2016-03-15 NOTE — Assessment & Plan Note (Signed)
Patient currently asymptomatic, sexually active with one female partner and does not use barrier protection - Will do HIV, RPR, wet prep, GC/Chla today - Contraception counseling, particular use of condoms discussed at today's visit

## 2016-03-15 NOTE — Patient Instructions (Addendum)
It was a pleasure meeting you today in our clinic. Today we discussed birth control and STI screening. Here is the treatment plan we have discussed and agreed upon together:  - Please schedule an appointment in our procedures clinic on your way out of the office today for Nexplanon removal.  I would recommend placing another nexplanon at the time that it is removed. - We will test for STI's today, and I will call you with the results of these tests.  Bacterial Vaginosis - Please take the antibiotic twice daily for the next seven days.  Our clinic's number is [phone]. Please call with questions or concerns about what we discussed today.  Be well, Dr. Mosetta PuttFeng

## 2016-03-15 NOTE — Progress Notes (Signed)
   CC: STD screening  HPI: Brittney Holmes is a 21 y.o. female who [facility], asking for STI screening and Nexplanon removal because it is 21 years old. She would like her nexplanon removed because it is 21 years old.  STD check - one female partner - nexplanon for birth control - does not use condoms - no pruritis, no burning, +foul smelling discharge - no dysuria, no hematuria, no changes in urination  Foul Smelling Vaginal Discharge - no associated pruritis or burning as noted above - noticed x1 week - no fevers, no N/V/D/C  Health maintenance: Due for PAP, flu vaccine  Review of Symptoms:  See HPI for ROS.    Social History: Lives with her mother and 23 year old daughter  CC, SH/smoking status, and VS noted.  Objective: BP 100/80 (BP Location: Right Arm, Patient Position: Sitting, Cuff Size: Small)   Pulse 96   Temp 98.2 F (36.8 C) (Oral)   Ht 5\' 3"  (1.6 m)   Wt 93 lb (42.2 kg)   LMP 01/14/2016 (Approximate)   SpO2 97%   BMI 16.47 kg/m  GEN: NAD, alert, cooperative, and pleasant. EYE: no conjunctival injection, pupils equally round and reactive to light ENMT: normal tympanic light reflex, no nasal polyps,no rhinorrhea, no pharyngeal erythema or exudates NECK: full ROM, no thyromegaly RESPIRATORY: clear to auscultation bilaterally with no wheezes, rhonchi or rales, good effort CV: RRR, no m/r/g, no peripheral edema GI: soft, non-tender, non-distended, normoactive bowel sounds, no hepatosplenomegaly GU: external vagina without rash or lesion, cervix normal-appearing with small amt of white discharge SKIN: numerous tattoos. warm and dry, no rashes or lesions NEURO: II-XII grossly intact PSYCH: AAOx3, appropriate affect  Flu Vaccine: Refuses  Wet prep: Moderate bacteria, few clue cells, no trich, neg whiff  Assessment and plan:  Routine screening for STI (sexually transmitted infection) Patient currently asymptomatic, sexually active with  one female partner and does not use barrier protection - Will do HIV, RPR, wet prep, GC/Chla today - Contraception counseling, particular use of condoms discussed at today's visit  Healthcare maintenance - Due for PAP, erformed today - Patient would like Nexplanon removed, will have her schedule for procedures clinic - Recommend inserting new nexplanon at that time - patient will discuss with her mom and decide.  BV (bacterial vaginosis) Diagnosed with wet prep in office.  - metronidazole intravaginal application BID for 5 days (patient refuses to take pills) - return precautions provided   Orders Placed This Encounter  Procedures  . HIV antibody  . RPR  . POCT Wet Prep [facility] [place])     Howard PouchLauren Marti Acebo, MD,MS,  PGY1 03/15/2016 5:17 PM

## 2016-03-15 NOTE — Assessment & Plan Note (Signed)
Diagnosed with wet prep in office.  - metronidazole intravaginal application BID for 5 days (patient refuses to take pills) - return precautions provided

## 2016-03-16 LAB — RPR

## 2016-03-16 LAB — HIV ANTIBODY (ROUTINE TESTING W REFLEX): HIV 1&2 Ab, 4th Generation: NONREACTIVE

## 2016-03-18 ENCOUNTER — Telehealth: Payer: Self-pay | Admitting: Student in an Organized Health Care Education/Training Program

## 2016-03-18 ENCOUNTER — Ambulatory Visit: Payer: Medicaid Other | Admitting: Student in an Organized Health Care Education/Training Program

## 2016-03-18 ENCOUNTER — Other Ambulatory Visit (HOSPITAL_COMMUNITY)
Admission: RE | Admit: 2016-03-18 | Discharge: 2016-03-18 | Disposition: A | Discharge status: Home / Self Care | Payer: Medicaid Other | Source: Ambulatory Visit | Attending: Family Medicine | Admitting: Family Medicine

## 2016-03-18 DIAGNOSIS — Z01419 Encounter for gynecological examination (general) (routine) without abnormal findings: Secondary | ICD-10-CM | POA: Insufficient documentation

## 2016-03-18 DIAGNOSIS — Z1151 Encounter for screening for human papillomavirus (HPV): Secondary | ICD-10-CM | POA: Diagnosis present

## 2016-03-18 NOTE — Addendum Note (Signed)
Addended by: Lamonte SakaiZIMMERMAN RUMPLE, Pema Thomure D on: 03/18/2016 04:33 PM   Modules accepted: Orders

## 2016-03-18 NOTE — Telephone Encounter (Signed)
Spoke with patient and rescheduled her appointment for procedures clinic.  Shanda BumpsJessica will cancel her appt for this morning.

## 2016-03-21 LAB — CERVICOVAGINAL ANCILLARY ONLY
Chlamydia: NEGATIVE
Neisseria Gonorrhea: NEGATIVE

## 2016-03-21 LAB — CYTOLOGY - PAP
Adequacy: ABSENT
Diagnosis: NEGATIVE
HPV (WINDOPATH): DETECTED — AB

## 2016-03-23 ENCOUNTER — Telehealth: Payer: Self-pay | Admitting: Student in an Organized Health Care Education/Training Program

## 2016-03-23 NOTE — Telephone Encounter (Signed)
Called and spoke with patient regarding STI screening results (all normal) and her Pap results.  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  Normally HPV would not be tested in her age group and the positive HPV result puts her outside the ACOG algorithm.  - I discussed this case with our attending, Dr. Jennette KettleNeal who suggested that we may continue routine screening given her age group and likelihood that she will clear the HPV virus normally, so we would do her next PAP in 3 years.   - I explained all of this to the patient and let her know we can either screen again in one year, or proceed with normal routine screening and do her next Pap in 3 years.  Patient is agreeable to doing her next Pap in 3 years.

## 2016-03-24 ENCOUNTER — Encounter (HOSPITAL_COMMUNITY): Payer: Self-pay

## 2016-03-24 ENCOUNTER — Emergency Department (HOSPITAL_COMMUNITY): Payer: Medicaid Other

## 2016-03-24 ENCOUNTER — Emergency Department (HOSPITAL_COMMUNITY)
Admission: EM | Admit: 2016-03-24 | Discharge: 2016-03-24 | Disposition: A | Discharge status: Home / Self Care | Payer: Medicaid Other | Attending: Emergency Medicine | Admitting: Emergency Medicine

## 2016-03-24 DIAGNOSIS — R0600 Dyspnea, unspecified: Secondary | ICD-10-CM

## 2016-03-24 DIAGNOSIS — R000 Tachycardia, unspecified: Secondary | ICD-10-CM | POA: Insufficient documentation

## 2016-03-24 DIAGNOSIS — R0602 Shortness of breath: Secondary | ICD-10-CM | POA: Insufficient documentation

## 2016-03-24 LAB — CBC
HEMATOCRIT: 35.7 % — AB (ref 36.0–46.0)
Hemoglobin: 11.8 g/dL — ABNORMAL LOW (ref 12.0–15.0)
MCH: 28.2 pg (ref 26.0–34.0)
MCHC: 33.1 g/dL (ref 30.0–36.0)
MCV: 85.4 fL (ref 78.0–100.0)
Platelets: 429 10*3/uL — ABNORMAL HIGH (ref 150–400)
RBC: 4.18 MIL/uL (ref 3.87–5.11)
RDW: 12.8 % (ref 11.5–15.5)
WBC: 11.1 10*3/uL — ABNORMAL HIGH (ref 4.0–10.5)

## 2016-03-24 LAB — BASIC METABOLIC PANEL
Anion gap: 12 (ref 5–15)
BUN: 6 mg/dL (ref 6–20)
CO2: 18 mmol/L — ABNORMAL LOW (ref 22–32)
CREATININE: 0.65 mg/dL (ref 0.44–1.00)
Calcium: 9.5 mg/dL (ref 8.9–10.3)
Chloride: 105 mmol/L (ref 101–111)
GFR calc Af Amer: >60 mL/min (ref >60)
GFR calc non Af Amer: >60 mL/min (ref >60)
GLUCOSE: 122 mg/dL — AB (ref 65–99)
POTASSIUM: 3.3 mmol/L — AB (ref 3.5–5.1)
Sodium: 135 mmol/L (ref 135–145)

## 2016-03-24 LAB — D-DIMER, QUANTITATIVE: D-Dimer, Quant: <0.27 ug/mL-FEU (ref 0.00–0.50)

## 2016-03-24 LAB — I-STAT TROPONIN, ED: Troponin i, poc: 0 ng/mL (ref 0.00–0.08)

## 2016-03-24 MED ORDER — METOPROLOL TARTRATE 5 MG/5ML IV SOLN
2.5000 mg | Freq: Once | INTRAVENOUS | Status: AC
  Administered 2016-03-24: 2.5 mg via INTRAVENOUS
  Filled 2016-03-24: qty 5

## 2016-03-24 MED ORDER — METOPROLOL TARTRATE 25 MG PO TABS: 25.0000 mg | ORAL_TABLET | Freq: Once | ORAL | Status: DC

## 2016-03-24 NOTE — ED Triage Notes (Signed)
Sob and resp distress per ems, sts had runs of  Rapid heart rate, anxiety noted.

## 2016-03-24 NOTE — Discharge Instructions (Signed)
Your evaluation did not show any problems with your lungs or heart. The exact cause for why you were feeling short of breath is not clear, but it is not anything serious.

## 2016-03-24 NOTE — ED Triage Notes (Signed)
Pt reporst stress and started smoking weed a week ago and then stopped 3 days ago. sts feeling dizzy when steanding up. Ems placed 18 g iv

## 2016-03-24 NOTE — ED Provider Notes (Signed)
[facility]-EM[facility] DEPT Provider Note   CSN: [id number] Arrival date & time: 03/24/16  0121     History   Chief Complaint Chief Complaint  Patient presents with  . Shortness of Breath    HPI Brittney Holmes is a 21 y.o. female.  She states that she has been using marijuana for the last week. During this time, she has had episodic difficulty breathing. She gets very anxious when this occurs. She denies chest pain, heaviness, tightness, pressure. There is no cough, fever, chills, sweats. She denies palpitations. Nothing makes symptoms better or worse She denies any recent surgery or travel, but she does have a contraceptive implant.   The history is provided by the patient.  Shortness of Breath     Past Medical History:  Diagnosis Date  . Chlamydia infection 05/31/2013  . Insufficient prenatal care in third trimester 09/03/2012   Lapsed prenatal care. Records being transferred, got care at [facility] country, awaiting records Genetic Screen Will await records  Anatomic [place] Normal, HC<3rd%ile  Repeat:  Glucose Screen 79  GBS   Feeding Preference Bottle  Contraception Unsure  Circumcision      . Maternal iron deficiency anemia complicating pregnancy in third trimester 10/05/2012  . Mild preeclampsia 10/07/2012  . NSVD (normal spontaneous vaginal delivery) 10/06/2012  . Pregnancy induced hypertension   . Social problem 08/12/2012   Homeless, she and mother temporarily living with FOB   . Trichomonas infection 02/14/2014  . Vaginal discharge 11/02/2012    Patient Active Problem List   Diagnosis Date Noted  . Routine screening for STI (sexually transmitted infection) 03/15/2016  . Healthcare maintenance 03/15/2016  . BV (bacterial vaginosis) 03/15/2016  . Presence of subdermal contraceptive implant 11/02/2012  . Anemia 10/08/2012    History reviewed. No pertinent surgical history.  OB History    Gravida Para Term Preterm AB Living   2 1 1  0 1 1   SAB TAB Ectopic Multiple Live Births   1 0 0  0 1       Home Medications    Prior to Admission medications   Not on File    Family History Family History  Problem Relation Age of Onset  . Cancer Maternal Grandmother     lung    Social History Social History  Substance Use Topics  . Smoking status: Never Smoker  . Smokeless tobacco: Never Used  . Alcohol use No     Allergies   Patient has no known allergies.   Review of Systems Review of Systems  Respiratory: Positive for shortness of breath.   All other systems reviewed and are negative.    Physical Exam Updated Vital Signs BP 109/83   Pulse 85   Temp 97.8 F (36.6 C) (Oral)   Resp 16   SpO2 99%   Physical Exam  Nursing note and vitals reviewed.  21 year old female, resting comfortably and in no acute distress. Vital signs are normal, but she is demonstrating significant tachycardia while I am in the room with her-heart rate up to 145. Oxygen saturation is 99%, which is normal. Head is normocephalic and atraumatic. PERRLA, EOMI. Oropharynx is clear. Neck is nontender and supple without adenopathy or JVD. Back is nontender and there is no CVA tenderness. Lungs are clear without rales, wheezes, or rhonchi. Chest is nontender. Heart is tachycardic without murmur. Abdomen is soft, flat, nontender without masses or hepatosplenomegaly and peristalsis is normoactive. Extremities have no cyanosis or edema, full range of motion is present.  Skin is warm and dry without rash. Neurologic: Mental status is normal, cranial nerves are intact, there are no motor or sensory deficits.  ED Treatments / Results  Labs (all labs ordered are listed, but only abnormal results are displayed) Labs Reviewed  BASIC METABOLIC PANEL - Abnormal; Notable for the following:       Result Value   Potassium 3.3 (*)    CO2 18 (*)    Glucose, Bld 122 (*)    All other components within normal limits  CBC - Abnormal; Notable for the following:    WBC 11.1 (*)    Hemoglobin 11.8  (*)    HCT 35.7 (*)    Platelets 429 (*)    All other components within normal limits  D-DIMER, QUANTITATIVE (NOT AT [facility])  Rosezena SensorI-STAT TROPOININ, ED    EKG Henderson BaltimoreLEWIS, Rashunda JX:[id number] 24-Mar-2016 01:27:12 [facility] ROUTINE RECORD Sinus tachycardia Possible Left atrial enlargement Septal infarct , age undetermined Abnormal ECG 2325mm/s 1210mm/mV 100Hz  9.0.4 12SL 241 HD CID: 46 Unconfirmed Vent. rate 109 BPM PR interval 140 ms QRS duration 78 ms QT/QTc 306/412 ms P-R-T axes 82 88 58 10-Feb-1996 (20 yr) Female Black Room: Loc:11 Technician: [id number]  Radiology Dg Chest 2 View  Result Date: 03/24/2016 CLINICAL DATA:  Initial evaluation for acute shortness of breath. EXAM: CHEST  2 VIEW COMPARISON:  Prior radiograph from 02/01/2013. FINDINGS: The cardiac and mediastinal silhouettes are stable in size and contour, and remain within normal limits. The lungs are mildly hyperinflated. No airspace consolidation, pleural effusion, or pulmonary edema is identified. There is no pneumothorax. No acute osseous abnormality identified. IMPRESSION: Mild hyperinflation. No other active cardiopulmonary disease identified. Electronically Signed   By: Rise MuBenjamin  McClintock M.D.   On: 03/24/2016 01:48    Procedures Procedures (including critical care time)  Medications Ordered in ED Medications  metoprolol (LOPRESSOR) injection 2.5 mg (2.5 mg Intravenous Given 03/24/16 0509)     Initial Impression / Assessment and Plan / ED Course  I have reviewed the triage vital signs and the nursing notes.  Pertinent labs & imaging results that were available during my care of the patient were reviewed by me and considered in my medical decision making (see chart for details).  Subjective dyspnea with normal respiratory exam, normal chest x-ray, normal pulse oximetry. Significant tachycardia-consider possibility of pulmonary embolism. Only risk factor she has is exogenous estrogen. Will screen with  d-dimer. Because of episodes of tachycardia, and she will be given a low dose of metoprolol. Old records are reviewed, and she has no relevant past visits.  D-dimer is normal. Heart rate has come down with low dose of metoprolol. She is feeling well. No indication for ongoing treatment with beta blockers. Patient is reassured of the negative workup and is advised to stop using marijuana. She is given outpatient mental health resources.  Final Clinical Impressions(s) / ED Diagnoses   Final diagnoses:  Dyspnea, unspecified type  Sinus tachycardia    New Prescriptions New Prescriptions   No medications on file     Dione Boozeavid Fabiha Rougeau, MD 03/24/16 [phone]

## 2016-03-30 ENCOUNTER — Ambulatory Visit (INDEPENDENT_AMBULATORY_CARE_PROVIDER_SITE_OTHER): Payer: Medicaid Other | Admitting: Family Medicine

## 2016-03-30 VITALS — BP 100/60 | HR 102 | Temp 98.2°F | Ht 63.0 in | Wt 90.0 lb

## 2016-03-30 DIAGNOSIS — Z3046 Encounter for surveillance of implantable subdermal contraceptive: Secondary | ICD-10-CM | POA: Diagnosis present

## 2016-03-30 NOTE — Progress Notes (Signed)
Patient ID: Brittney Holmes, female   DOB: [birth year], 21 y.o.   MRN: [id number] Progestin Implant Removal Note (PATIENT NAME) is here for removal of her etonogestrel rod implant (Implanon/Nexplanon). She would like it removed because of: it is pass due for removal.  An informed consent was taken prior to removal and is to be scanned into the Electronic Health Record.  Risks of the procedure include: bleeding, infection, difficulty with removal, scarring and nerve damage. There may be bruising at the site of incision and down the arm.  Procedure Note: Time out taken: 3:23pm  Team: Drs Lum BabeEniola and Mosetta PuttFeng  Response bolded in red below.  (PATIENT NAME) (PATIENT DOB) confirmed (YES/NO)  Procedure: Progestin Implant Removal  Procedure confirmed by patient and team (YES/NO)  Side: (RIGHT/LEFT)  Position correct for procedure (YES/NO)  Equipment for procedure available (YES/NO)  The patient is place in the supine position. Aseptic conditions are maintained. The rod is located by palpation. The area is cleaned with antiseptic. 4 cc of 2% lidocaine with epinephrine is injected just underneath the end of the implant closest to the elbow. After firmly pressing down on the end of the implant closer to the axilla a 2-3 mm incision is made with a scalpel. The rod is pushed to the incision site and grasped with a mosquito forceps and gently removed. Blunt dissection (WAS/WAS NOT) needed. The patient (DID/DID NOT) tolerate the procedure well. The rod was removed in its entirety. The incision was dressed with a small adhesive bandage closure and a pressure dressing was applied. An alternate plan for contraception was discussed. The patient would like to use Mirena for her contraception. [facility]. In the mean time she is instructed to use condom for contraception.  Brittney PaganKehinde Caldonia Leap, MD, MPH

## 2016-03-30 NOTE — Progress Notes (Signed)
   CC: nexplanon removal   HPI: Brittney Holmes is a 21 y.o. female who presents to procedures clinic for nexplanon removal and insertion of new nexplanon. She reports tolerating the nexplanon well previously.

## 2016-03-30 NOTE — Patient Instructions (Signed)
It was a pleasure seeing you today in our clinic. Today we removed the nexplanon from your arm. - Please keep the area clean and dry for the next few days - It is ok to shower, but if the bandage becomes wet you should change the bandage or put a new bandaid over the area if you do shower   Please schedule an appointment for gynecology procedures clinic as you leave today to have mirena placed.  (The intrauterine device for contraception).  Our clinic's number is [phone]. Please call with questions or concerns about what we discussed today.  Be well, Dr. Mosetta PuttFeng

## 2016-04-28 ENCOUNTER — Ambulatory Visit: Payer: Medicaid Other

## 2016-05-09 ENCOUNTER — Ambulatory Visit: Payer: Medicaid Other | Admitting: Family Medicine

## 2016-05-16 ENCOUNTER — Ambulatory Visit: Payer: Medicaid Other

## 2016-05-17 ENCOUNTER — Ambulatory Visit (INDEPENDENT_AMBULATORY_CARE_PROVIDER_SITE_OTHER): Payer: Medicaid Other | Admitting: Family Medicine

## 2016-05-17 ENCOUNTER — Encounter: Payer: Self-pay | Admitting: Family Medicine

## 2016-05-17 VITALS — BP 118/78 | HR 113 | Temp 97.5°F | Ht 63.0 in | Wt 91.0 lb

## 2016-05-17 DIAGNOSIS — Z30013 Encounter for initial prescription of injectable contraceptive: Secondary | ICD-10-CM | POA: Diagnosis not present

## 2016-05-17 LAB — POCT URINE PREGNANCY: PREG TEST UR: NEGATIVE

## 2016-05-17 MED ORDER — MEDROXYPROGESTERONE ACETATE 150 MG/ML IM SUSP
150.0000 mg | Freq: Once | INTRAMUSCULAR | Status: AC
  Administered 2016-05-17: 150 mg via INTRAMUSCULAR

## 2016-05-17 NOTE — Patient Instructions (Signed)
We will initiate Depo shots today. Please return in 3months for your next shot.  Dr. Caroleen Hammanumley

## 2016-05-18 NOTE — Progress Notes (Signed)
Subjective:     Patient ID: Brittney Holmes, female   DOB: 01-22-1996, 21 y.o.   MRN: [id number]  HPI Mrs. Melvyn NethLewis is a 21yo female presenting today to discuss birth control. Presenting today to initiated Depo. States she has been wanting to gain weight and is hopeful that Depo will not only provide birth control but may also help her to gain a few pounds. Denies personal or family history of blood clots. Denies history of migraines. History of Nexplanon removal in 02/2016. Unsure if pregnant. Has not restarted period following Nexplanon removal.  Review of Systems Per HPI    Objective:   Physical Exam  Constitutional: She appears well-developed and well-nourished. No distress.  HENT:  Head: Normocephalic and atraumatic.  Cardiovascular: Normal rate and regular rhythm.   No murmur heard. Pulmonary/Chest: Effort normal. No respiratory distress. She has no wheezes.      Assessment and Plan:     1. Encounter for initial prescription of injectable contraceptive Pregnancy test negative. Birth control methods discussed. Will initiate Depo Provera today. Return in 3months for next injection.

## 2016-06-02 ENCOUNTER — Encounter (HOSPITAL_COMMUNITY): Payer: Self-pay

## 2016-06-02 ENCOUNTER — Emergency Department (HOSPITAL_COMMUNITY): Payer: Medicaid Other

## 2016-06-02 ENCOUNTER — Emergency Department (HOSPITAL_COMMUNITY)
Admission: EM | Admit: 2016-06-02 | Discharge: 2016-06-02 | Disposition: A | Discharge status: Home / Self Care | Payer: Medicaid Other | Attending: Emergency Medicine | Admitting: Emergency Medicine

## 2016-06-02 DIAGNOSIS — Y999 Unspecified external cause status: Secondary | ICD-10-CM | POA: Insufficient documentation

## 2016-06-02 DIAGNOSIS — W228XXA Striking against or struck by other objects, initial encounter: Secondary | ICD-10-CM | POA: Insufficient documentation

## 2016-06-02 DIAGNOSIS — Y939 Activity, unspecified: Secondary | ICD-10-CM | POA: Insufficient documentation

## 2016-06-02 DIAGNOSIS — S0181XA Laceration without foreign body of other part of head, initial encounter: Secondary | ICD-10-CM

## 2016-06-02 DIAGNOSIS — Y929 Unspecified place or not applicable: Secondary | ICD-10-CM | POA: Diagnosis not present

## 2016-06-02 DIAGNOSIS — Z23 Encounter for immunization: Secondary | ICD-10-CM | POA: Diagnosis not present

## 2016-06-02 DIAGNOSIS — S01112A Laceration without foreign body of left eyelid and periocular area, initial encounter: Secondary | ICD-10-CM | POA: Diagnosis not present

## 2016-06-02 MED ORDER — HYDROCODONE-ACETAMINOPHEN 5-325 MG PO TABS: 1.0000 | ORAL_TABLET | Freq: Once | ORAL | Status: DC

## 2016-06-02 MED ORDER — TETANUS-DIPHTH-ACELL PERTUSSIS 5-2.5-18.5 LF-MCG/0.5 IM SUSP
0.5000 mL | Freq: Once | INTRAMUSCULAR | Status: AC
  Administered 2016-06-02: 0.5 mL via INTRAMUSCULAR
  Filled 2016-06-02: qty 0.5

## 2016-06-02 MED ORDER — OXYCODONE HCL 5 MG PO TABS
5.0000 mg | ORAL_TABLET | Freq: Once | ORAL | Status: AC
  Administered 2016-06-02: 5 mg via ORAL
  Filled 2016-06-02: qty 1

## 2016-06-02 MED ORDER — LIDOCAINE-EPINEPHRINE (PF) 2 %-1:200000 IJ SOLN
10.0000 mL | Freq: Once | INTRAMUSCULAR | Status: AC
  Administered 2016-06-02: 10 mL
  Filled 2016-06-02: qty 20

## 2016-06-02 MED ORDER — ACETAMINOPHEN 160 MG/5ML PO SOLN
650.0000 mg | Freq: Once | ORAL | Status: AC
  Administered 2016-06-02: 650 mg via ORAL
  Filled 2016-06-02: qty 20.3
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
## 2016-06-02 NOTE — ED Notes (Signed)
Bed: [facility] Expected date:  Expected time:  Means of arrival:  Comments: 

## 2016-06-02 NOTE — Discharge Instructions (Signed)

## 2016-06-18 ENCOUNTER — Inpatient Hospital Stay (HOSPITAL_COMMUNITY)
Admission: EM | Admit: 2016-06-18 | Discharge: 2016-06-20 | DRG: 777 | Disposition: A | Discharge status: Home / Self Care | Payer: Medicaid Other | Attending: Obstetrics and Gynecology | Admitting: Obstetrics and Gynecology

## 2016-06-18 ENCOUNTER — Encounter (HOSPITAL_COMMUNITY)
Admission: EM | Disposition: A | Discharge status: Home / Self Care | Payer: Self-pay | Source: Home / Self Care | Attending: Obstetrics and Gynecology

## 2016-06-18 ENCOUNTER — Encounter (HOSPITAL_COMMUNITY): Payer: Self-pay

## 2016-06-18 ENCOUNTER — Inpatient Hospital Stay (HOSPITAL_COMMUNITY): Payer: Medicaid Other | Admitting: Anesthesiology

## 2016-06-18 ENCOUNTER — Inpatient Hospital Stay (HOSPITAL_COMMUNITY): Payer: Medicaid Other

## 2016-06-18 DIAGNOSIS — I959 Hypotension, unspecified: Secondary | ICD-10-CM | POA: Diagnosis not present

## 2016-06-18 DIAGNOSIS — O008 Other ectopic pregnancy without intrauterine pregnancy: Secondary | ICD-10-CM | POA: Diagnosis present

## 2016-06-18 DIAGNOSIS — O00101 Right tubal pregnancy without intrauterine pregnancy: Secondary | ICD-10-CM | POA: Diagnosis not present

## 2016-06-18 DIAGNOSIS — D649 Anemia, unspecified: Secondary | ICD-10-CM | POA: Diagnosis not present

## 2016-06-18 DIAGNOSIS — O26891 Other specified pregnancy related conditions, first trimester: Secondary | ICD-10-CM

## 2016-06-18 DIAGNOSIS — K661 Hemoperitoneum: Secondary | ICD-10-CM

## 2016-06-18 DIAGNOSIS — R58 Hemorrhage, not elsewhere classified: Secondary | ICD-10-CM

## 2016-06-18 DIAGNOSIS — O009 Unspecified ectopic pregnancy without intrauterine pregnancy: Secondary | ICD-10-CM

## 2016-06-18 DIAGNOSIS — R109 Unspecified abdominal pain: Secondary | ICD-10-CM

## 2016-06-18 HISTORY — PX: LAPAROTOMY: SHX154

## 2016-06-18 HISTORY — DX: Other ectopic pregnancy without intrauterine pregnancy: O00.80

## 2016-06-18 HISTORY — DX: Hemoperitoneum: K66.1

## 2016-06-18 HISTORY — DX: Right tubal pregnancy without intrauterine pregnancy: O00.101

## 2016-06-18 LAB — CBC WITH DIFFERENTIAL/PLATELET
Basophils Absolute: 0.1 10*3/uL (ref 0.0–0.1)
Basophils Relative: 1 %
Eosinophils Absolute: 1 10*3/uL — ABNORMAL HIGH (ref 0.0–0.7)
Eosinophils Relative: 7 %
HCT: 23.2 % — ABNORMAL LOW (ref 36.0–46.0)
HEMOGLOBIN: 7.4 g/dL — AB (ref 12.0–15.0)
LYMPHS ABS: 5.7 10*3/uL — AB (ref 0.7–4.0)
LYMPHS PCT: 39 %
MCH: 27.6 pg (ref 26.0–34.0)
MCHC: 31.9 g/dL (ref 30.0–36.0)
MCV: 86.6 fL (ref 78.0–100.0)
Monocytes Absolute: 0.8 10*3/uL (ref 0.1–1.0)
Monocytes Relative: 6 %
NEUTROS PCT: 47 %
Neutro Abs: 7 10*3/uL (ref 1.7–7.7)
Platelets: 402 10*3/uL — ABNORMAL HIGH (ref 150–400)
RBC: 2.68 MIL/uL — AB (ref 3.87–5.11)
RDW: 12.9 % (ref 11.5–15.5)
WBC: 14.5 10*3/uL — AB (ref 4.0–10.5)

## 2016-06-18 LAB — COMPREHENSIVE METABOLIC PANEL
ALBUMIN: 2.9 g/dL — AB (ref 3.5–5.0)
ALK PHOS: 47 U/L (ref 38–126)
ALT: 8 U/L — ABNORMAL LOW (ref 14–54)
ANION GAP: 8 (ref 5–15)
AST: 21 U/L (ref 15–41)
BUN: 5 mg/dL — ABNORMAL LOW (ref 6–20)
CALCIUM: 7.8 mg/dL — AB (ref 8.9–10.3)
CO2: 19 mmol/L — AB (ref 22–32)
Chloride: 109 mmol/L (ref 101–111)
Creatinine, Ser: 0.76 mg/dL (ref 0.44–1.00)
GFR calc Af Amer: >60 mL/min (ref >60)
GFR calc non Af Amer: >60 mL/min (ref >60)
GLUCOSE: 136 mg/dL — AB (ref 65–99)
POTASSIUM: 2.8 mmol/L — AB (ref 3.5–5.1)
SODIUM: 136 mmol/L (ref 135–145)
Total Bilirubin: 0.5 mg/dL (ref 0.3–1.2)
Total Protein: 5.1 g/dL — ABNORMAL LOW (ref 6.5–8.1)

## 2016-06-18 LAB — PREPARE RBC (CROSSMATCH)

## 2016-06-18 LAB — I-STAT BETA HCG BLOOD, ED (MC, WL, AP ONLY): I-stat hCG, quantitative: >2000 m[IU]/mL — ABNORMAL HIGH (ref <5)

## 2016-06-18 LAB — ABO/RH: ABO/RH(D): O POS

## 2016-06-18 LAB — LIPASE, BLOOD: Lipase: 19 U/L (ref 11–51)

## 2016-06-18 SURGERY — LAPAROTOMY, EXPLORATORY
Anesthesia: General | Site: Abdomen

## 2016-06-18 MED ORDER — PROPOFOL 10 MG/ML IV BOLUS
INTRAVENOUS | Status: DC | PRN
  Administered 2016-06-18: 100 mg via INTRAVENOUS

## 2016-06-18 MED ORDER — SODIUM CHLORIDE 0.9 % IV BOLUS (SEPSIS)
1000.0000 mL | Freq: Once | INTRAVENOUS | Status: AC
  Administered 2016-06-18: 1000 mL via INTRAVENOUS

## 2016-06-18 MED ORDER — MIDAZOLAM HCL 2 MG/2ML IJ SOLN
INTRAMUSCULAR | Status: AC
  Filled 2016-06-18: qty 2

## 2016-06-18 MED ORDER — SODIUM CHLORIDE 0.9 % IV SOLN: INTRAVENOUS | Status: DC

## 2016-06-18 MED ORDER — FAMOTIDINE IN NACL 20-0.9 MG/50ML-% IV SOLN
INTRAVENOUS | Status: AC
  Filled 2016-06-18: qty 50

## 2016-06-18 MED ORDER — ONDANSETRON HCL 4 MG/2ML IJ SOLN
INTRAMUSCULAR | Status: DC | PRN
  Administered 2016-06-18: 4 mg via INTRAVENOUS

## 2016-06-18 MED ORDER — FENTANYL CITRATE (PF) 100 MCG/2ML IJ SOLN
INTRAMUSCULAR | Status: DC | PRN
  Administered 2016-06-18: 100 ug via INTRAVENOUS
  Administered 2016-06-18 (×2): 50 ug via INTRAVENOUS

## 2016-06-18 MED ORDER — ONDANSETRON HCL 4 MG/2ML IJ SOLN: 4.0000 mg | Freq: Once | INTRAMUSCULAR | Status: DC | PRN

## 2016-06-18 MED ORDER — FAMOTIDINE IN NACL 20-0.9 MG/50ML-% IV SOLN
20.0000 mg | Freq: Once | INTRAVENOUS | Status: DC
  Filled 2016-06-18: qty 50

## 2016-06-18 MED ORDER — MEPERIDINE HCL 25 MG/ML IJ SOLN: 6.2500 mg | INTRAMUSCULAR | Status: DC | PRN

## 2016-06-18 MED ORDER — SODIUM CHLORIDE 0.9 % IV SOLN: Freq: Once | INTRAVENOUS | Status: AC

## 2016-06-18 MED ORDER — FENTANYL CITRATE (PF) 250 MCG/5ML IJ SOLN
INTRAMUSCULAR | Status: AC
  Filled 2016-06-18: qty 5

## 2016-06-18 MED ORDER — LACTATED RINGERS IV SOLN
INTRAVENOUS | Status: DC | PRN
  Administered 2016-06-18: 23:00:00 via INTRAVENOUS

## 2016-06-18 MED ORDER — MIDAZOLAM HCL 2 MG/2ML IJ SOLN
INTRAMUSCULAR | Status: DC | PRN
  Administered 2016-06-18: 2 mg via INTRAVENOUS

## 2016-06-18 MED ORDER — 0.9 % SODIUM CHLORIDE (POUR BTL) OPTIME
TOPICAL | Status: DC | PRN
  Administered 2016-06-18: 1000 mL

## 2016-06-18 MED ORDER — BUPIVACAINE HCL (PF) 0.5 % IJ SOLN
INTRAMUSCULAR | Status: DC | PRN
  Administered 2016-06-18: 20 mL

## 2016-06-18 MED ORDER — SOD CITRATE-CITRIC ACID 500-334 MG/5ML PO SOLN
ORAL | Status: AC
  Administered 2016-06-18: 30 mL via ORAL
  Filled 2016-06-18: qty 15

## 2016-06-18 MED ORDER — SOD CITRATE-CITRIC ACID 500-334 MG/5ML PO SOLN
30.0000 mL | Freq: Once | ORAL | Status: AC
  Administered 2016-06-18: 30 mL via ORAL

## 2016-06-18 MED ORDER — CEFAZOLIN SODIUM-DEXTROSE 2-4 GM/100ML-% IV SOLN
2.0000 g | Freq: Once | INTRAVENOUS | Status: AC
  Administered 2016-06-18: 2 g via INTRAVENOUS
  Filled 2016-06-18 (×2): qty 100

## 2016-06-18 MED ORDER — FENTANYL CITRATE (PF) 100 MCG/2ML IJ SOLN
25.0000 ug | INTRAMUSCULAR | Status: DC | PRN
Start: 2016-06-18 — End: 2016-06-19
  Administered 2016-06-19: 50 ug via INTRAVENOUS

## 2016-06-18 MED ORDER — SODIUM CHLORIDE 0.9 % IV SOLN
INTRAVENOUS | Status: DC | PRN
  Administered 2016-06-18 (×3): via INTRAVENOUS

## 2016-06-18 MED ORDER — SODIUM CHLORIDE 0.9 % IV SOLN
Freq: Once | INTRAVENOUS | Status: AC
  Administered 2016-06-18: 22:00:00 via INTRAVENOUS

## 2016-06-18 MED ORDER — SUCCINYLCHOLINE CHLORIDE 20 MG/ML IJ SOLN
INTRAMUSCULAR | Status: DC | PRN
  Administered 2016-06-18: 100 mg via INTRAVENOUS

## 2016-06-18 SURGICAL SUPPLY — 42 items
ADH SKN CLS APL DERMABOND .7 (GAUZE/BANDAGES/DRESSINGS) ×2
BAG SPEC RTRVL LRG 6X4 10 (ENDOMECHANICALS)
BLADE 10 SAFETY STRL DISP (BLADE) ×6 IMPLANT
CABLE HIGH FREQUENCY MONO STRZ (ELECTRODE) IMPLANT
CATH ROBINSON RED A/P 16FR (CATHETERS) ×4 IMPLANT
CLOTH BEACON ORANGE TIMEOUT ST (SAFETY) ×4 IMPLANT
DERMABOND ADVANCED (GAUZE/BANDAGES/DRESSINGS) ×2
DERMABOND ADVANCED .7 DNX12 (GAUZE/BANDAGES/DRESSINGS) ×2 IMPLANT
DRAPE CESAREAN BIRTH W POUCH (DRAPES) ×3 IMPLANT
DURAPREP 26ML APPLICATOR (WOUND CARE) ×4 IMPLANT
GLOVE BIO SURGEON STRL SZ 6.5 (GLOVE) ×3 IMPLANT
GLOVE BIO SURGEONS STRL SZ 6.5 (GLOVE) ×1
GLOVE BIOGEL PI IND STRL 7.0 (GLOVE) ×6 IMPLANT
GLOVE BIOGEL PI INDICATOR 7.0 (GLOVE) ×6
GLOVE ECLIPSE 7.0 STRL STRAW (GLOVE) ×4 IMPLANT
GOWN STRL REUS W/TWL LRG LVL3 (GOWN DISPOSABLE) ×8 IMPLANT
NS IRRIG 1000ML POUR BTL (IV SOLUTION) ×4 IMPLANT
PACK LAPAROSCOPY BASIN (CUSTOM PROCEDURE TRAY) ×4 IMPLANT
PACK TRENDGUARD 450 HYBRID PRO (MISCELLANEOUS) IMPLANT
PACK TRENDGUARD 600 HYBRD PROC (MISCELLANEOUS) IMPLANT
POUCH SPECIMEN RETRIEVAL 10MM (ENDOMECHANICALS) IMPLANT
PROTECTOR NERVE ULNAR (MISCELLANEOUS) ×8 IMPLANT
SET IRRIG TUBING LAPAROSCOPIC (IRRIGATION / IRRIGATOR) IMPLANT
SHEARS HARMONIC ACE PLUS 36CM (ENDOMECHANICALS) IMPLANT
SLEEVE XCEL OPT CAN 5 100 (ENDOMECHANICALS) ×4 IMPLANT
SPONGE LAP 18X18 X RAY DECT (DISPOSABLE) ×16 IMPLANT
SUT MNCRL AB 4-0 PS2 18 (SUTURE) ×4 IMPLANT
SUT VIC AB 0 CT1 27 (SUTURE) ×24
SUT VIC AB 0 CT1 27XBRD ANBCTR (SUTURE) ×6 IMPLANT
SUT VIC AB 2-0 CT1 27 (SUTURE) ×4
SUT VIC AB 2-0 CT1 TAPERPNT 27 (SUTURE) ×2 IMPLANT
SUT VIC AB 4-0 SH 27 (SUTURE) ×4
SUT VIC AB 4-0 SH 27XANBCTRL (SUTURE) ×1 IMPLANT
SUT VICRYL 0 UR6 27IN ABS (SUTURE) IMPLANT
TOWEL OR 17X24 6PK STRL BLUE (TOWEL DISPOSABLE) ×8 IMPLANT
TRAY FOLEY CATH SILVER 14FR (SET/KITS/TRAYS/PACK) IMPLANT
TRENDGUARD 450 HYBRID PRO PACK (MISCELLANEOUS)
TRENDGUARD 600 HYBRID PROC PK (MISCELLANEOUS)
TROCAR XCEL NON-BLD 11X100MML (ENDOMECHANICALS) IMPLANT
TROCAR XCEL NON-BLD 5MMX100MML (ENDOMECHANICALS) ×4 IMPLANT
TUBING SUCTION BULK 100 FT (MISCELLANEOUS) ×3 IMPLANT
YANKAUER SUCT BULB TIP NO VENT (SUCTIONS) ×3 IMPLANT

## 2016-06-18 NOTE — ED Notes (Signed)
Dr Dalene Seltzer requested that a second unit of blood be administered to the pt before she transfers to womens.

## 2016-06-18 NOTE — ED Notes (Signed)
This RN called [facility] to give an update on pts status.

## 2016-06-18 NOTE — ED Provider Notes (Signed)
[facility]-EMER[facility]ENCY DEPT Provider Note   CSN: [id number] Arrival date & time: 06/18/16  1926     History   Chief Complaint Chief Complaint  Patient presents with  . Hypotension  . Abdominal Pain    HPI Brittney Holmes is a 21 y.o. female.  HPI   21 year old female presents with concern for sudden onset of lower abdominal cramping beginning at 5 PM while she was walking around the store today. Reports she was in normal state of health prior to this. Reports she's had no fevers, no nausea vomiting, no diarrhea, no black or bloody stools. Reports that she's been on Depakote, and her LMP is unclear. She reports she had a pregnancy test prior to getting her depo shot about 1 month ago which was negative. Suddenly, she developed severe abdominal cramping, located below the umbilicus, with associated lightheadedness and near-syncope. On EMS arrival, her blood pressures were in the 60s systolic, and she received 400 mL of normal saline. Patient reports that she has had unprotected intercourse, however because she is on Depo has low suspicion for pregnancy. Denies any vaginal discharge.  Past Medical History:  Diagnosis Date  . Chlamydia infection 05/31/2013  . Insufficient prenatal care in third trimester 09/03/2012   Lapsed prenatal care. Records being transferred, got care at [facility], awaiting records Genetic Screen Will await records  Anatomic [place] Normal, HC<3rd%ile  Repeat:  Glucose Screen 79  GBS   Feeding Preference Bottle  Contraception Unsure  Circumcision      . Maternal iron deficiency anemia complicating pregnancy in third trimester 10/05/2012  . Mild preeclampsia 10/07/2012  . NSVD (normal spontaneous vaginal delivery) 10/06/2012  . Pregnancy induced hypertension   . Social problem 08/12/2012   Homeless, she and mother temporarily living with FOB   . Trichomonas infection 02/14/2014  . Vaginal discharge 11/02/2012    Patient Active Problem List   Diagnosis Date Noted  . Routine  screening for STI (sexually transmitted infection) 03/15/2016  . Healthcare maintenance 03/15/2016  . BV (bacterial vaginosis) 03/15/2016  . Presence of subdermal contraceptive implant 11/02/2012  . Anemia 10/08/2012    History reviewed. No pertinent surgical history.  OB History    Gravida Para Term Preterm AB Living   0 1 1   SAB TAB Ectopic Multiple Live Births   1 0 0 0 1       Home Medications    Prior to Admission medications   Not on File    Family History Family History  Problem Relation Age of Onset  . Cancer Maternal Grandmother     lung    Social History Social History  Substance Use Topics  . Smoking status: Never Smoker  . Smokeless tobacco: Never Used  . Alcohol use No     Allergies   Patient has no known allergies.   Review of Systems Review of Systems  Constitutional: Positive for fatigue. Negative for appetite change and fever.  HENT: Negative for sore throat.   Eyes: Negative for visual disturbance.  Respiratory: Negative for cough and shortness of breath.   Cardiovascular: Negative for chest pain.  Gastrointestinal: Positive for abdominal pain. Negative for constipation, diarrhea, nausea and vomiting.  Genitourinary: Negative for difficulty urinating, dysuria, vaginal bleeding and vaginal discharge.  Musculoskeletal: Negative for back pain and neck pain.  Skin: Negative for rash.  Neurological: Positive for light-headedness. Negative for syncope and headaches.     Physical Exam Updated Vital Signs BP (!) 89/49   Pulse Marland Kitchen)  115   Temp (!) 95.6 F (35.3 C)   Resp (!) 23   Wt 93 lb (42.2 kg)   SpO2 100%   BMI 16.47 kg/m   Physical Exam  Constitutional: She is oriented to person, place, and time. She appears well-developed and well-nourished. She appears toxic. She has a sickly appearance. She appears ill.  HENT:  Head: Normocephalic and atraumatic.  Eyes: Conjunctivae and EOM are normal.  Neck: Normal range of motion.    Cardiovascular: Normal rate, regular rhythm, normal heart sounds and intact distal pulses.  Exam reveals no gallop and no friction rub.   No murmur heard. Pulmonary/Chest: Effort normal and breath sounds normal. No respiratory distress. She has no wheezes. She has no rales.  Abdominal: Soft. She exhibits no distension. There is generalized tenderness. There is rigidity and guarding.  Musculoskeletal: She exhibits no edema or tenderness.  Neurological: She is alert and oriented to person, place, and time.  Skin: Skin is warm and dry. No rash noted. She is not diaphoretic. No erythema. There is pallor.  Nursing note and vitals reviewed.    ED Treatments / Results  Labs (all labs ordered are listed, but only abnormal results are displayed) Labs Reviewed  COMPREHENSIVE METABOLIC PANEL - Abnormal; Notable for the following:       Result Value   Potassium 2.8 (*)    CO2 19 (*)    Glucose, Bld 136 (*)    BUN 5 (*)    Calcium 7.8 (*)    Total Protein 5.1 (*)    Albumin 2.9 (*)    ALT 8 (*)    All other components within normal limits  CBC WITH DIFFERENTIAL/PLATELET - Abnormal; Notable for the following:    WBC 14.5 (*)    RBC 2.68 (*)    Hemoglobin 7.4 (*)    HCT 23.2 (*)    Platelets 402 (*)    Lymphs Abs 5.7 (*)    Eosinophils Absolute 1.0 (*)    All other components within normal limits  I-STAT BETA HCG BLOOD, ED ([facility], [facility], [facility] ONLY) - Abnormal; Notable for the following:    I-stat hCG, quantitative >2,000.0 (*)    All other components within normal limits  CULTURE, BLOOD (ROUTINE X 2)  CULTURE, BLOOD (ROUTINE X 2)  LIPASE, BLOOD  URINALYSIS, ROUTINE W REFLEX MICROSCOPIC  I-STAT CG4 LACTIC ACID, ED  TYPE AND SCREEN  ABO/RH  PREPARE RBC (CROSSMATCH)    EKG  EKG Interpretation None       Radiology No results found.  Procedures Procedures (including critical care time)  Medications Ordered in ED Medications  0.9 %  sodium chloride infusion (not administered)   sodium chloride 0.9 % bolus 1,000 mL (1,000 mLs Intravenous New Bag/Given 06/18/16 2000)  sodium chloride 0.9 % bolus 1,000 mL (0 mLs Intravenous Stopped 06/18/16 2042)    CRITICAL CARE: concern for ruptured ectopic pregnancy  Performed by: Rhae Lerner   Total critical care time: 45 minutes  Critical care time was exclusive of separately billable procedures and treating other patients.  Critical care was necessary to treat or prevent imminent or life-threatening deterioration.  Critical care was time spent personally by me on the following activities: development of treatment plan with patient and/or surrogate as well as nursing, discussions with consultants, evaluation of patient's response to treatment, examination of patient, obtaining history from patient or surrogate, ordering and performing treatments and interventions, ordering and review of laboratory studies, ordering and review of radiographic  studies, pulse oximetry and re-evaluation of patient's condition.  Initial Impression / Assessment and Plan / ED Course  I have reviewed the triage vital signs and the nursing notes.  Pertinent labs & imaging results that were available during my care of the patient were reviewed by me and considered in my medical decision making (see chart for details).    21 year old female with prior history of chlamydia, preeclampsia, presents with concern for sudden onset of abdominal pain and hypotension. Patient with blood pressure in the 70s on arrival to the emergency department. 2 large-bore IVs were established, normal saline was given with improvement in blood pressures. Differential diagnosis initially included ectopic pregnancy with rupture, other perforated viscus or sepsis.  Bedside ultrasound shows free fluid, anechoic material in the uterus, without clear signs of an intrauterine pregnancy. Pregnancy test returned positive with an hCG of greater than 2000. Patient also with areas  cystic abnormalities on ultrasound. High suspicion at this time is for a ruptured ectopic pregnancy. I have lower suspicion for pregnancy plus other etiologies (ruptured appy/sepsis) given sudden onset of pain in a patient who is otherwise asymptomatic.  Do not feel patient is stable enough to undergo formal ultrasound, and given high clinical suspicion for ruptured ectopic pregnancy given symptoms, positive hcg, concern on bedside US, discussed with Dr. Jolayne Panther OBGYN who accepts patient for transfer.  Blood pressure is initially improved to 100 systolic, and subsequently dropped to 80s. Hemoglobin returned at 7.4 down from 11.8 previously. Given concern for ongoing bleeding, patient's hypoension, ordered 2 units of emergency release blood to be administered to patient in transport.  Updated Dr. Jolayne Panther regarding transfusion. Patient with normal mental status, BP improved with blood and saline running, now 90s, stable for emergent transfer.    Final Clinical Impressions(s) / ED Diagnoses   Final diagnoses:  Ruptured ectopic pregnancy, concern for  Hypotension, unspecified hypotension type  Hemorrhage    New Prescriptions New Prescriptions   No medications on file     Alvira Monday, MD 06/18/16 2119

## 2016-06-18 NOTE — Op Note (Signed)
PROCEDURE DATE:06/18/2016  PREOPERATIVE DIAGNOSIS:  Ruptured tubal ectopic pregnancy causing hemoperitoneum, hemodynamically unstable POSTOPERATIVE DIAGNOSIS:  Ruptured right cornual ectopic pregnancy causing hemoperitoneum, hemodynamically unstable  SURGEONS:   Jaynie Collins, MD and Catalina Antigua, MD ANESTHESIOLOGIST: Dr. Kyung Rudd Oddono OPERATION:  Exploratory laparotomy,  right cornual wedge resection and salpingectomy ANESTHESIA:  General endotracheal.  INDICATIONS: The patient is a 21 y.o. G4P0030 with  ruptured tubal ectopic pregnancy causing hemoperitoneum and hemodynamic instability who needed urgent definitive surgical management. The risks of surgery were discussed with the patient including but not limited to: bleeding which may require transfusion or reoperation; infection which may require antibiotics; injury to bowel, bladder, ureters or other surrounding organs; need for additional procedures; thromboembolic phenomenon, incisional problems and other postoperative/anesthesia complications. Written informed consent was obtained.    OPERATIVE FINDINGS:  Large amount of hemoperitoneum estimated to be about 2 L of blood and clots.  Ruptured right cornual region noted with active bleeding.  Small uterus with normal left fallopian tube and normal bilateral ovaries.   No other anomalies noted in pelvis.  ESTIMATED BLOOD LOSS: Minimal intraoperative blood loss; 2 L hemoperitoneum FLUIDS:  2 L of Lactated Ringers and 2 units of pRBCs URINE OUTPUT:  150 ml of clear yellow urine. SPECIMENS:  Right cornual section and  fallopian tube sent to pathology COMPLICATIONS:  None immediate.  DESCRIPTION OF PROCEDURE:  The patient received intravenous antibiotics and had sequential compression devices applied to her lower extremities while in MAU.   She was taken to the operating room and placed under general anesthesia without difficulty.The abdomen and perineum were prepped and draped in a sterile  manner, and she was placed in a dorsal supine position.  A Foley catheter was inserted into the bladder and attached to constant drainage. After an adequate timeout was performed, a small Pfannensteil skin incision was made with the scalpel.  This incision was taken down to the fascia and the fascia was incised in the midline and the fascial incision was then extended bilaterally without difficulty. The fascia was then dissected off the underlying rectus muscles using blunt and sharp dissection. The rectus muscles were split bluntly in the midline and the peritoneum entered bluntly without complication. This peritoneal incision was then extended superiorly and inferiorly with care given to prevent bowel or bladder injury.  There was a large amount of hemoperitoneum, a pool suction instrument was used to clear most of it for visualization.  The bowel was then packed away with moist laparotomy sponges.  The ruptured cornual section was noted and clamps were placed around this area; it was then wedge resected.  The pedicles were doubly suture ligated with 0 Vicryl. The right fallopian tube was clamped, cut and excised and the pedicle was secured in a similar fashion.   The pelvis was irrigated and hemostasis was reconfirmed.   All laparotomy sponges and instruments were removed from the abdomen. The peritoneum was reapproximated with 2-0 Vicryl running stitch. The fascia was also closed in a running fashion with 0 Vicryl suture.  The skin was closed with a 4-0 Vicryl subcuticular stitch. Sponge, lap, needle, and instrument counts were correct times two. The patient was taken to the recovery area awake, extubated and in stable condition.   Jaynie Collins, MD, FACOG Attending Obstetrician & Gynecologist Faculty Practice, [facility]

## 2016-06-18 NOTE — Anesthesia Procedure Notes (Signed)
Procedure Name: Intubation Date/Time: 06/18/2016 10:47 PM Performed by: Bethena Midget Pre-anesthesia Checklist: Timeout performed and Patient identified Patient Re-evaluated:Patient Re-evaluated prior to inductionPreoxygenation: Pre-oxygenation with 100% oxygen Intubation Type: IV induction, Rapid sequence and Cricoid Pressure applied Laryngoscope Size: Hyacinth Meeker and 3

## 2016-06-18 NOTE — ED Triage Notes (Signed)
Pt from home with ems c/o syncopal episode, pt fell while out in the yard. Pt also c/o generalized abd pain all over 10/10. Pt has had diarrhea for the past 2 days with no vomiting. Pt pale and hypotensive. 63/35 with EMS. 18G in LAC with 400cc saline administered. Other VSS. Pt anxious upon arrival.

## 2016-06-18 NOTE — H&P (Signed)
Faculty Practice Attending History and Physical  Brittney Holmes is a 21 y.o. G2P1011 transferred from [facility] ER with presumed ruptured ectopic pregnancy and hypotension; needing urgent surgical management. She was hemodynamically unstable there with marked hypotension and arrived here with 2 units of pRBCs concurrently transfusing. On arrival, her condition was noted to be slightly improved but still concerning. She reported a lot of abdominal pain and nausea.  Holmes other preoperative concerns.  Proposed surgery: Laparoscopy, possible laparotomy, unilateral salpingectomy  Past Medical History:  Diagnosis Date  . Chlamydia infection 05/31/2013  . Mild preeclampsia 10/07/2012  . Trichomonas infection 02/14/2014   History reviewed. Holmes pertinent surgical history.   OB History  Gravida Para Term Preterm AB Living  0 1 1  SAB TAB Ectopic Multiple Live Births  1 0 0 0 1    # Outcome Date GA Lbr Len/2nd Weight Sex Delivery Anes PTL Lv  2 Term 10/06/12 [redacted]w[redacted]d 03:42 / 01:18 4 lb 15.8 oz (2.262 kg) F Vag-Spont EPI  LIV  1 SAB             Patient denies any other pertinent gynecologic issues.   Holmes current facility-administered medications on file prior to encounter.    Holmes current outpatient prescriptions on file prior to encounter.   Holmes Known Allergies  Social History:   reports that she has never smoked. She has never used smokeless tobacco. She reports that she does not drink alcohol or use drugs.  Family History  Problem Relation Age of Onset  . Cancer Maternal Grandmother     lung    Review of Systems: Noncontributory  PHYSICAL EXAM: Blood pressure (!) 90/50, pulse (!) 121, temperature (!) 95.6 F (35.3 C), resp. rate 14, weight 93 lb (42.2 kg), SpO2 100 %. CONSTITUTIONAL: Well-developed, well-nourished female in some distress due to pain.  HENT:  Normocephalic, atraumatic, External right and left ear normal. Oropharynx is clear and dry. EYES: Conjunctivae and EOM are normal. Pupils  are equal, round, and reactive to light. Holmes scleral icterus.  NECK: Normal range of motion, supple, Holmes masses SKIN: Skin is warm and dry. Holmes rash noted. Not diaphoretic. Holmes erythema. Some pallor noted. NEUROLOGIC: Alert and oriented to person, place, and time. Normal reflexes, muscle tone coordination. Holmes cranial nerve deficit noted. PSYCHIATRIC: Normal mood and affect. Normal behavior. Normal judgment and thought content. CARDIOVASCULAR: Elevated heart rate noted, regular rhythm RESPIRATORY: Effort and breath sounds normal, Holmes problems with respiration noted ABDOMEN: Soft, very distended, very tender, +rebound, +guarding PELVIC: Deferred MUSCULOSKELETAL: Normal range of motion. Holmes edema and Holmes tenderness. 2+ distal pulses.  Labs: Results for orders placed or performed during the hospital encounter of 06/18/16 (from the past 336 hour(s))  Lipase, blood   Collection Time: 06/18/16  7:52 PM  Result Value Ref Range   Lipase 19 11 - 51 U/L  Comprehensive metabolic panel   Collection Time: 06/18/16  7:52 PM  Result Value Ref Range   Sodium 136 135 - 145 mmol/L   Potassium 2.8 (L) 3.5 - 5.1 mmol/L   Chloride 109 101 - 111 mmol/L   CO2 19 (L) 22 - 32 mmol/L   Glucose, Bld 136 (H) 65 - 99 mg/dL   BUN 5 (L) 6 - 20 mg/dL   Creatinine, Ser 7.82 0.44 - 1.00 mg/dL   Calcium 7.8 (L) 8.9 - 10.3 mg/dL   Total Protein 5.1 (L) 6.5 - 8.1 g/dL   Albumin 2.9 (L) 3.5 - 5.0 g/dL  AST 21 15 - 41 U/L   ALT 8 (L) 14 - 54 U/L   Alkaline Phosphatase 47 38 - 126 U/L   Total Bilirubin 0.5 0.3 - 1.2 mg/dL   GFR calc non Af Amer >60 >60 mL/min   GFR calc Af Amer >60 >60 mL/min   Anion gap 8 5 - 15  CBC with Differential   Collection Time: 06/18/16  7:52 PM  Result Value Ref Range   WBC 14.5 (H) 4.0 - 10.5 K/uL   RBC 2.68 (L) 3.87 - 5.11 MIL/uL   Hemoglobin 7.4 (L) 12.0 - 15.0 g/dL   HCT 95.6 (L) 21.3 - 08.6 %   MCV 86.6 78.0 - 100.0 fL   MCH 27.6 26.0 - 34.0 pg   MCHC 31.9 30.0 - 36.0 g/dL   RDW 57.8  46.9 - 62.9 %   Platelets 402 (H) 150 - 400 K/uL   Neutrophils Relative % 47 %   Neutro Abs 7.0 1.7 - 7.7 K/uL   Lymphocytes Relative 39 %   Lymphs Abs 5.7 (H) 0.7 - 4.0 K/uL   Monocytes Relative 6 %   Monocytes Absolute 0.8 0.1 - 1.0 K/uL   Eosinophils Relative 7 %   Eosinophils Absolute 1.0 (H) 0.0 - 0.7 K/uL   Basophils Relative 1 %   Basophils Absolute 0.1 0.0 - 0.1 K/uL  I-Stat Beta hCG blood, ED ([facility], [facility], [facility] only)   Collection Time: 06/18/16  7:59 PM  Result Value Ref Range   I-stat hCG, quantitative >2,000.0 (H) <5 mIU/mL   Comment 3          Type and screen [facility]   Collection Time: 06/18/16  8:09 PM  Result Value Ref Range   ABO/RH(D) O POS    Antibody Screen PENDING    Sample Expiration 06/21/2016    Unit Number [id number]    Blood Component Type RED CELLS,LR    Unit division 00    Status of Unit ISSUED    Unit tag comment VERBAL ORDERS PER DR [facility]    Transfusion Status OK TO TRANSFUSE    Crossmatch Result PENDING    Unit Number [id number]    Blood Component Type RED CELLS,LR    Unit division 00    Status of Unit ISSUED    Unit tag comment VERBAL ORDERS PER DR [facility]    Transfusion Status OK TO TRANSFUSE    Crossmatch Result PENDING   ABO/Rh   Collection Time: 06/18/16  8:09 PM  Result Value Ref Range   ABO/RH(D) O POS   BPAM RBC   Collection Time: 06/18/16  8:09 PM  Result Value Ref Range   ISSUE DATE / TIME [id number]    Blood Product Unit Number [id number]    PRODUCT CODE E0336V00    Unit Type and Rh 9500    Blood Product Expiration Date [id number]    ISSUE DATE / TIME [id number]    Blood Product Unit Number [id number]    PRODUCT CODE H6073X10    Unit Type and Rh 9500    Blood Product Expiration Date [id number]     Imaging Studies: 06/18/16  Ultrasound preliminary read:  Large amount of hemoperitoneum. Unable to visualize ectopic pregnancy or any lesion due to amount of blood.  MDM:  Around 2202,  patient became very hypotensive after initial counseling for surgery, BP 41/15. Taken urgently to OR for laparotomy and salpingectomy.   Patient Vitals for the past 24 hrs:  BP Temp Temp src  Pulse Resp SpO2 Weight  06/18/16 2208 - - - (!) 111 - 98 % -  06/18/16 2207 - - - (!) 137 - 100 % -  06/18/16 2203 - - - (!) 118 - - -  06/18/16 2202 (!) 41/15 - - (!) 42 - 100 % -  06/18/16 2201 - - - (!) 115 - 99 % -  06/18/16 2200 (!) 84/71 - - (!) 114 - 100 % -  06/18/16 2159 - - - (!) 110 - - -  06/18/16 2155 - - - (!) 127 - 100 % -  06/18/16 2154 - - - (!) 130 - 100 % -  06/18/16 2153 - - - (!) 117 - 100 % -  06/18/16 2148 - - - (!) 121 - 100 % -  06/18/16 2144 (!) 90/50 - - (!) 115 - - -  06/18/16 2143 (!) 90/50 - - (!) 115 14 - -  06/18/16 2115 (!) 89/49 (!) 95.6 F (35.3 C) - (!) 115 (!) 23 100 % -  06/18/16 2101 (!) 93/48 - - (!) 106 (!) 30 100 % -  06/18/16 2045 (!) 82/49 - - (!) 102 (!) 30 100 % -  06/18/16 2030 (!) 72/32 - - 96 (!) 47 100 % -  06/18/16 2000 91/79 - - - (!) 22 - -  06/18/16 1958 (!) 79/45 97.8 F (36.6 C) Rectal (!) 110 (!) 22 98 % -  06/18/16 1933 - - - - - - 93 lb (42.2 kg)  06/18/16 1926 - - - - - 98 % -    Assessment: Patient Active Problem List   Diagnosis Date Noted  . Hemoperitoneum due to rupture of presumed tubal ectopic pregnancy 06/18/2016    Plan: Patient will undergo surgical management with laparotomy, unilateral salpingectomy. She is not hemodynamically stable and is likely still having ongoing bleeding so this surgery is urgent.  Risks of surgery including bleeding which may require transfusion or reoperation, infection, injury to bowel or other surrounding organs, need for additional procedures, thromboembolic phenomenon, surgical site problems and other postoperative/anesthesia complications. Likelihood of success in alleviating the patient's condition was discussed. Routine postoperative instructions will be reviewed with the patient and her  family in detail after surgery.  The patient concurred with the proposed plan, giving informed written consent for the surgery.  Anesthesia and OR aware.  Preoperative prophylactic antibiotics and SCDs ordered on call to the OR.  To OR now  for this urgent surgery.   Jaynie Collins, M.D. 06/18/2016 10:06 PM

## 2016-06-18 NOTE — Anesthesia Preprocedure Evaluation (Addendum)
Anesthesia Evaluation  Patient identified by MRN, date of birth, ID band Patient awake    Reviewed: Allergy & Precautions, H&P , NPO status , Patient's Chart, lab work & pertinent test results  Airway Mallampati: I  TM Distance: >3 FB Neck ROM: full    Dental no notable dental hx. (+) Teeth Intact   Pulmonary neg pulmonary ROS,    Pulmonary exam normal breath sounds clear to auscultation       Cardiovascular hypertension, Normal cardiovascular exam Rhythm:Regular Rate:Normal     Neuro/Psych negative neurological ROS  negative psych ROS   GI/Hepatic negative GI ROS, Neg liver ROS,   Endo/Other  negative endocrine ROS  Renal/GU negative Renal ROS  negative genitourinary   Musculoskeletal negative musculoskeletal ROS (+)   Abdominal   Peds negative pediatric ROS (+)  Hematology  (+) anemia ,   Anesthesia Other Findings   Reproductive/Obstetrics (+) Pregnancy ectopic                            Anesthesia Physical Anesthesia Plan  ASA: II and emergent  Anesthesia Plan: General ETT   Post-op Pain Management:    Induction: Intravenous, Rapid sequence and Cricoid pressure planned  Airway Management Planned: Oral ETT  Additional Equipment:   Intra-op Plan:   Post-operative Plan: Extubation in OR  Informed Consent: I have reviewed the patients History and Physical, chart, labs and discussed the procedure including the risks, benefits and alternatives for the proposed anesthesia with the patient or authorized representative who has indicated his/her understanding and acceptance.   Dental advisory given  Plan Discussed with: Anesthesiologist and CRNA  Anesthesia Plan Comments:        Anesthesia Quick Evaluation

## 2016-06-18 NOTE — ED Notes (Signed)
ED Provider at bedside to update family 

## 2016-06-18 NOTE — ED Notes (Signed)
Pt rectal temperature 95.6 prior to transfer to [facility]. Warm blankets provided to the pt after placing pt on stretcher.

## 2016-06-18 NOTE — Progress Notes (Signed)
Notified Dr Macon Large of BP of 41/15. Patient in and out of consciousness. Dr Macon Large changed urgent case to STAT. Transferred to OR with patient.

## 2016-06-19 DIAGNOSIS — I959 Hypotension, unspecified: Secondary | ICD-10-CM | POA: Diagnosis present

## 2016-06-19 DIAGNOSIS — K661 Hemoperitoneum: Secondary | ICD-10-CM | POA: Diagnosis present

## 2016-06-19 DIAGNOSIS — O00101 Right tubal pregnancy without intrauterine pregnancy: Secondary | ICD-10-CM | POA: Diagnosis present

## 2016-06-19 DIAGNOSIS — D649 Anemia, unspecified: Secondary | ICD-10-CM | POA: Diagnosis not present

## 2016-06-19 LAB — BPAM RBC
BLOOD PRODUCT EXPIRATION DATE: 201805102359
Blood Product Expiration Date: 201805102359
ISSUE DATE / TIME: 201804212304
ISSUE DATE / TIME: 201804212304
UNIT TYPE AND RH: 5100
Unit Type and Rh: 5100

## 2016-06-19 LAB — CBC
HCT: 29.7 % — ABNORMAL LOW (ref 36.0–46.0)
HEMOGLOBIN: 10.9 g/dL — AB (ref 12.0–15.0)
MCH: 30.6 pg (ref 26.0–34.0)
MCHC: 36.7 g/dL — ABNORMAL HIGH (ref 30.0–36.0)
MCV: 83.4 fL (ref 78.0–100.0)
PLATELETS: 107 10*3/uL — AB (ref 150–400)
RBC: 3.56 MIL/uL — AB (ref 3.87–5.11)
RDW: 13.8 % (ref 11.5–15.5)
WBC: 15.3 10*3/uL — AB (ref 4.0–10.5)

## 2016-06-19 LAB — TYPE AND SCREEN
ABO/RH(D): O POS
ANTIBODY SCREEN: NEGATIVE
Unit division: 0
Unit division: 0

## 2016-06-19 MED ORDER — SUCCINYLCHOLINE CHLORIDE 200 MG/10ML IV SOSY
PREFILLED_SYRINGE | INTRAVENOUS | Status: AC
  Filled 2016-06-19: qty 10

## 2016-06-19 MED ORDER — ONDANSETRON HCL 4 MG/2ML IJ SOLN
INTRAMUSCULAR | Status: AC
  Filled 2016-06-19: qty 2

## 2016-06-19 MED ORDER — CEFAZOLIN SODIUM-DEXTROSE 2-4 GM/100ML-% IV SOLN
INTRAVENOUS | Status: AC
  Filled 2016-06-19: qty 100

## 2016-06-19 MED ORDER — DEXTROSE IN LACTATED RINGERS 5 % IV SOLN
INTRAVENOUS | Status: DC
Start: 2016-06-19 — End: 2016-06-19
  Administered 2016-06-19: 02:00:00 via INTRAVENOUS

## 2016-06-19 MED ORDER — HYDROMORPHONE HCL 1 MG/ML IJ SOLN
1.0000 mg | INTRAMUSCULAR | Status: DC | PRN
  Administered 2016-06-19: 1 mg via INTRAVENOUS
  Filled 2016-06-19: qty 1

## 2016-06-19 MED ORDER — ACETAMINOPHEN 160 MG/5ML PO SOLN
325.0000 mg | ORAL | Status: DC | PRN
  Administered 2016-06-20: 325 mg via ORAL
  Filled 2016-06-19: qty 20.3

## 2016-06-19 MED ORDER — FENTANYL CITRATE (PF) 100 MCG/2ML IJ SOLN
INTRAMUSCULAR | Status: AC
  Filled 2016-06-19: qty 2

## 2016-06-19 MED ORDER — OXYCODONE-ACETAMINOPHEN 5-325 MG PO TABS
1.0000 | ORAL_TABLET | Freq: Four times a day (QID) | ORAL | Status: DC | PRN
  Filled 2016-06-19: qty 1

## 2016-06-19 MED ORDER — SODIUM CHLORIDE 0.9% FLUSH: 3.0000 mL | INTRAVENOUS | Status: DC | PRN

## 2016-06-19 MED ORDER — OXYCODONE HCL 5 MG/5ML PO SOLN
10.0000 mg | ORAL | Status: DC | PRN
  Administered 2016-06-20: 10 mg via ORAL
  Filled 2016-06-19: qty 10

## 2016-06-19 MED ORDER — OXYCODONE HCL 5 MG/5ML PO SOLN
5.0000 mg | ORAL | Status: DC | PRN
  Administered 2016-06-19 (×3): 5 mg via ORAL
  Filled 2016-06-19 (×3): qty 5

## 2016-06-19 MED ORDER — PROPOFOL 10 MG/ML IV BOLUS
INTRAVENOUS | Status: AC
  Filled 2016-06-19: qty 20

## 2016-06-19 MED ORDER — ACETAMINOPHEN 160 MG/5ML PO SOLN
325.0000 mg | ORAL | Status: DC | PRN
  Administered 2016-06-19 (×2): 325 mg via ORAL
  Filled 2016-06-19 (×3): qty 20.3

## 2016-06-19 NOTE — Anesthesia Postprocedure Evaluation (Signed)
Anesthesia Post Note  Patient: [name]  Procedure(s) Performed: Procedure(s): EXPLORATORY LAPAROTOMY, RIGHT CORNEAL WEDGE RESECTION,RIGHT SALPINGECTOMY  Patient location during evaluation: PACU Anesthesia Type: General Level of consciousness: awake and alert and oriented Pain management: pain level controlled Vital Signs Assessment: post-procedure vital signs reviewed and stable Respiratory status: spontaneous breathing, nonlabored ventilation, respiratory function stable and patient connected to nasal cannula oxygen Cardiovascular status: blood pressure returned to baseline and stable Postop Assessment: no signs of nausea or vomiting Anesthetic complications: no        Last Vitals:  Vitals:   06/19/16 0310 06/19/16 0640  BP: 101/73 106/76  Pulse: (!) 104 96  Resp:  17  Temp: 36.9 C 36.4 C    Last Pain:  Vitals:   06/19/16 0640  TempSrc: Oral  PainSc:    Pain Goal: Patients Stated Pain Goal: 2 (06/19/16 0258)               Brodee Mauritz

## 2016-06-19 NOTE — Transfer of Care (Signed)
Immediate Anesthesia Transfer of Care Note  Patient: Brittney Holmes  Procedure(s) Performed: Procedure(s): EXPLORATORY LAPAROTOMY, RIGHT CORNEAL WEDGE RESECTION,RIGHT SALPINGECTOMY  Patient Location: PACU  Anesthesia Type:General  Level of Consciousness: awake  Airway & Oxygen Therapy: Patient connected to nasal cannula oxygen  Post-op Assessment: BP 96/72  Post vital signs: Reviewed and stable  Last Vitals:  Vitals:   06/18/16 2208 06/18/16 2345  BP:    Pulse: (!) 111 97  Resp:  (!) 22  Temp:  36.7 C    Last Pain:  Vitals:   06/18/16 2143  TempSrc:   PainSc: 10-Worst pain ever         Complications: No apparent anesthesia complications

## 2016-06-19 NOTE — Addendum Note (Signed)
Addendum  created 06/19/16 0837 by Angela Adam, CRNA   Sign clinical note

## 2016-06-19 NOTE — Progress Notes (Signed)
Honey dressing changed per MD order.

## 2016-06-19 NOTE — Progress Notes (Signed)
Faculty Practice OB/GYN Attending Note  Talked to patient and her family about surgical details given the ruptured cornual ectopic pregnancy.  Talked about the wedge resection, and need for cesarean sections for subsequent deliveries that will be performed at 36-37 weeks. This is because of increased risk of uterine rupture during labor after the cornual resection.  They verbalized understanding of recommendations.  Patient is doing well.  She is being transitioned to oral analgesia and encouraged to ambulate. Probable discharge to home tomorrow.   Bricelyn Freestone, MD, FAJaynie Collinsg Obstetrician & Gynecologist Faculty Practice, [facility]

## 2016-06-19 NOTE — Anesthesia Postprocedure Evaluation (Signed)
Anesthesia Post Note  Patient: Brittney Holmes  Procedure(s) Performed: Procedure(s): EXPLORATORY LAPAROTOMY, RIGHT CORNEAL WEDGE RESECTION,RIGHT SALPINGECTOMY  Patient location during evaluation: [facility] Anesthesia Type: General Level of consciousness: awake and alert Pain management: pain level controlled Vital Signs Assessment: post-procedure vital signs reviewed and stable Respiratory status: spontaneous breathing Postop Assessment: no headache and no signs of nausea or vomiting Anesthetic complications: no Comments: Pt smiling and pleasant throughout interview.  Reports pain score of 6.  Encouraged to call bedside RN and request pain meds.        Last Vitals:  Vitals:   06/19/16 0310 06/19/16 0640  BP: 101/73 106/76  Pulse: (!) 104 96  Resp:  17  Temp: 36.9 C 36.4 C    Last Pain:  Vitals:   06/19/16 0640  TempSrc: Oral  PainSc:    Pain Goal: Patients Stated Pain Goal: 2 (06/19/16 0258)               Merrilyn Puma

## 2016-06-19 NOTE — Progress Notes (Signed)
1 Day Post-Op Procedure(s): EXPLORATORY LAPAROTOMY, RIGHT CORNEAL WEDGE RESECTION,RIGHT SALPINGECTOMY  Subjective: Patient reports abdominal pain well controlled with IV dilaudid. She tolerated a clear liquid diet and denies nausea and emesis. She desires to eat a regular diet.     Objective: I have reviewed patient's vital signs and intake and output.  General: alert, cooperative and no distress Resp: clear to auscultation bilaterally Cardio: regular rate and rhythm GI: soft, appropriately tender, non distended. Honey comb dressing saturated Extremities: extremities normal, atraumatic, no cyanosis or edema and Homans sign is negative, no sign of DVT  Assessment: s/p Procedure(s): EXPLORATORY LAPAROTOMY, RIGHT CORNEAL WEDGE RESECTION,RIGHT SALPINGECTOMY: stable and progressing well  Plan: Advance diet Encourage ambulation  Will discontinue foley  LOS: 0 days    Brittney Holmes 06/19/2016, 7:36 AM

## 2016-06-20 ENCOUNTER — Encounter (HOSPITAL_COMMUNITY): Payer: Self-pay | Admitting: *Deleted

## 2016-06-20 LAB — BPAM RBC
BLOOD PRODUCT EXPIRATION DATE: 201804282359
BLOOD PRODUCT EXPIRATION DATE: 201804282359
BLOOD PRODUCT EXPIRATION DATE: 201805182359
ISSUE DATE / TIME: 201804212053
ISSUE DATE / TIME: 201804212053
UNIT TYPE AND RH: 9500
UNIT TYPE AND RH: 9500
Unit Type and Rh: 5100

## 2016-06-20 LAB — TYPE AND SCREEN
ABO/RH(D): O POS
ANTIBODY SCREEN: NEGATIVE
UNIT DIVISION: 0
Unit division: 0
Unit division: 0

## 2016-06-20 LAB — CBC
HCT: 24.4 % — ABNORMAL LOW (ref 36.0–46.0)
Hemoglobin: 8.9 g/dL — ABNORMAL LOW (ref 12.0–15.0)
MCH: 30.3 pg (ref 26.0–34.0)
MCHC: 36.5 g/dL — AB (ref 30.0–36.0)
MCV: 83 fL (ref 78.0–100.0)
PLATELETS: 128 10*3/uL — AB (ref 150–400)
RBC: 2.94 MIL/uL — ABNORMAL LOW (ref 3.87–5.11)
RDW: 14.3 % (ref 11.5–15.5)
WBC: 9.6 10*3/uL (ref 4.0–10.5)

## 2016-06-20 MED ORDER — DOCUSATE SODIUM 50 MG/5ML PO LIQD: 100.0000 mg | Freq: Two times a day (BID) | ORAL | 2 refills | Status: DC | PRN

## 2016-06-20 MED ORDER — OXYCODONE HCL 5 MG/5ML PO SOLN: 10.0000 mg | Freq: Four times a day (QID) | ORAL | 0 refills | Status: DC | PRN

## 2016-06-20 MED ORDER — FERROUS SULFATE 300 (60 FE) MG/5ML PO SYRP: 300.0000 mg | ORAL_SOLUTION | Freq: Two times a day (BID) | ORAL | 3 refills | Status: DC

## 2016-06-20 MED ORDER — IBUPROFEN 100 MG/5ML PO SUSP: 600.0000 mg | Freq: Four times a day (QID) | ORAL | 3 refills | Status: DC | PRN

## 2016-06-20 NOTE — Discharge Summary (Signed)
Gynecology Physician Postoperative Discharge Summary  Patient ID: Brittney Holmes MRN: [id number] DOB/AGE: May 15, 1995 20 y.o.  Admit Date: 06/18/2016 Discharge Date: 06/20/2016  Preoperative Diagnoses: Hemoperitoneum due to ruptured ectopic pregnancy, hemodynamic instability  Procedures: Procedure(s): EXPLORATORY LAPAROTOMY, RIGHT CORNUAL WEDGE RESECTION,RIGHT SALPINGECTOMY  Hospital Course:  Brittney Holmes is a 21 y.o. Brittney Holmes transferred from [facility] ER for urgent surgical management of hemoperitoneum due to ruptured ectopic pregnancy, hemodynamic instability.  She underwent the procedures as mentioned above, her operation was remarkable for the discovery of a ruptured right cornual ectopic and over 2 L of hemoperitoneum. For further details about surgery, please refer to the operative report. Patient received a total of 4 units of pRBCs.   She had an uncomplicated postoperative course. By time of discharge on POD#2, her pain was controlled on oral pain medications; she was ambulating, voiding without difficulty, tolerating regular diet and passing flatus. She was started on oral iron therapy for postoperative anemia. She was deemed stable for discharge to home.   Significant Labs: CBC Latest Ref Rng & Units 06/20/2016 06/19/2016 06/18/2016  WBC 4.0 - 10.5 K/uL 9.6 15.3(H) 14.5(H)  Hemoglobin 12.0 - 15.0 g/dL 1.4(N) 10.9(L) 7.4(L)  Hematocrit 36.0 - 46.0 % 24.4(L) 29.7(L) 23.2(L)  Platelets 150 - 400 K/uL 128(L) 107(L) 402(H)    Discharge Exam: Blood pressure (!) 105/56, pulse (!) 106, temperature 98.4 F (36.9 C), temperature source Oral, resp. rate 18, height  (1.626 m), weight 93 lb (42.2 kg), last menstrual period 04/17/2016, SpO2 100 %. General appearance: alert and no distress  Resp: clear to auscultation bilaterally  Cardio: regular rate and rhythm  GI: soft, non-tender; bowel sounds normal; no masses, no organomegaly.  Incision: C/D/I, no erythema, old bloody drainage  noted Pelvic: scant blood on pad  Extremities: extremities normal, atraumatic, no cyanosis or edema and Homans sign is negative, no sign of DVT  Discharged Condition: Stable  Disposition: 01-Home or Self Care   Allergies as of 06/20/2016   No Known Allergies     Medication List    TAKE these medications   docusate 50 MG/5ML liquid Commonly known as:  COLACE Take 10 mLs (100 mg total) by mouth 2 (two) times daily as needed for mild constipation.   ferrous sulfate 300 (60 Fe) MG/5ML syrup Take 5 mLs (300 mg total) by mouth 2 (two) times daily with a meal.   ibuprofen 100 MG/5ML suspension Commonly known as:  ADVIL,MOTRIN Take 30 mLs (600 mg total) by mouth every 6 (six) hours as needed.   oxyCODONE 5 MG/5ML solution Commonly known as:  ROXICODONE Take 10 mLs (10 mg total) by mouth every 6 (six) hours as needed for severe pain.      Follow-up Information    [facility] Follow up on 06/27/2016.   Why:  9am  Lab draw: BHCG check only Contact information: [address] [place] [address] [phone]       Jaynie Collins, MD Follow up on 07/06/2016.   Specialty:  Obstetrics and Gynecology Why:  10:40 am for Postoperative appointment and repeat HCG check Contact information: [address] [place] [address] [phone]           Signed:  Jaynie Collins, MD, FACOG Attending Obstetrician & Gynecologist Faculty Practice, [facility]

## 2016-06-20 NOTE — Progress Notes (Signed)
Discharge teaching complete. Pt understood all information and did not have any questions. Pt pushed via wheelchair and discharged home to family. 

## 2016-06-20 NOTE — Discharge Instructions (Signed)
Ruptured Ectopic Pregnancy An ectopic pregnancy is when the fertilized egg attaches (implants) outside the uterus. Most ectopic pregnancies occur in the fallopian tube. Rarely do ectopic pregnancies occur on the ovary, intestine, pelvis, or cervix. An ectopic pregnancy does not have the ability to develop into a normal, healthy baby. A ruptured ectopic pregnancy is one in which the fallopian tube gets torn or bursts and results in internal bleeding. Often there is intense abdominal pain, and sometimes, vaginal bleeding. Having an ectopic pregnancy can be a life-threatening experience. If left untreated, this dangerous condition can lead to a blood transfusion, abdominal surgery, or even death. What are the causes? Damage to the fallopian tubes is the suspected cause in most ectopic pregnancies. What increases the risk? Depending on your circumstances, the amount of risk of having an ectopic pregnancy will vary. There are 3 categories that may help you identify whether you are potentially at risk. High Risk  You have gone through infertility treatment.  You have had a previous ectopic pregnancy.  You have had previous tubal surgery.  You have had previous surgery to have the fallopian tubes tied (tubal ligation).  You have tubal problems or diseases.  You have been exposed to DES. DES is a medicine that was used until 1971 and had effects on babies whose mothers took the medicine.  You become pregnant while using an intrauterine device (IUD) for birth control. Moderate Risk  You have a history of infertility.  You have a history of a sexually transmitted infection (STI).  You have a history of pelvic inflammatory disease (PID).  You have scarring from endometriosis.  You have multiple sexual partners.  You smoke. Low Risk  You have had previous pelvic surgery.  You use vaginal douching.  You became sexually active before 23 years of age. What are the signs or symptoms? An  ectopic pregnancy should be suspected in anyone who has missed a period and has abdominal pain or bleeding.  You may experience normal pregnancy symptoms, such as:  Nausea.  Tiredness.  Breast tenderness.  Symptoms that are not normal include:  Pain with intercourse.  Irregular vaginal bleeding or spotting.  Cramping or pain on one side, or in the lower abdomen.  Fast heartbeat.  Passing out while having a bowel movement.  Symptoms of a ruptured ectopic pregnancy and internal bleeding may include:  Sudden, severe pain in the abdomen and pelvis.  Dizziness or fainting.  Pain in the shoulder area. How is this diagnosed? Tests that may be performed include:  A pregnancy test.  An ultrasound.  Testing the specific level of pregnancy hormone in the bloodstream.  Taking a sample of uterus tissue (dilation and curettage, D&C).  Surgery to perform a visual exam of the inside of the abdomen using a lighted tube (laparoscopy). How is this treated? Laparoscopic surgery or abdominal surgery is recommended for a ruptured ectopic pregnancy.  The whole fallopian tube may need to be removed (salpingectomy).  If the tube is not too damaged, the tube may be saved, and the pregnancy will be surgically removed. Intime, the tube may still function.  If you have lost a lot of blood, you may need a blood transfusion.  You may receive a Rho (D) immune globulin shot if you are Rh negative and the father is Rh positive, or if you do not know the Rh type of the father. This is to prevent problems with any future pregnancy. Get help right away if: You have any symptoms  In time, the tube may still function.  · If you have lost a lot of blood, you may need a blood transfusion.  · You may receive a Rho (D) immune globulin shot if you are Rh negative and the father is Rh positive, or if you do not know the Rh type of the father. This is to prevent problems with any future pregnancy.    Get help right away if:  You have any symptoms of an ectopic or ruptured ectopic pregnancy. This is a medical emergency.  This information is not intended to replace advice given to you by your health care provider. Make sure you discuss any questions you have with your health care provider.  Document Released: 02/12/2000 Document Revised: 09/04/2015  Document Reviewed: 11/26/2012  Elsevier Interactive Patient Education © 2017 Elsevier Inc.

## 2016-06-20 NOTE — Addendum Note (Signed)
Addendum  created 06/20/16 1305 by Algis Greenhouse, CRNA   Charge Capture section accepted, Visit diagnoses modified

## 2016-06-21 ENCOUNTER — Encounter (HOSPITAL_COMMUNITY): Payer: Self-pay | Admitting: Obstetrics & Gynecology

## 2016-06-22 ENCOUNTER — Encounter (HOSPITAL_COMMUNITY): Payer: Self-pay

## 2016-06-22 ENCOUNTER — Inpatient Hospital Stay (HOSPITAL_COMMUNITY)
Admission: AD | Admit: 2016-06-22 | Discharge: 2016-06-22 | Disposition: A | Discharge status: Home / Self Care | Payer: Medicaid Other | Source: Ambulatory Visit | Attending: Family Medicine | Admitting: Family Medicine

## 2016-06-22 DIAGNOSIS — Z87898 Personal history of other specified conditions: Secondary | ICD-10-CM | POA: Insufficient documentation

## 2016-06-22 DIAGNOSIS — O00101 Right tubal pregnancy without intrauterine pregnancy: Secondary | ICD-10-CM

## 2016-06-22 DIAGNOSIS — O008 Other ectopic pregnancy without intrauterine pregnancy: Secondary | ICD-10-CM

## 2016-06-22 DIAGNOSIS — Z9889 Other specified postprocedural states: Secondary | ICD-10-CM | POA: Diagnosis not present

## 2016-06-22 DIAGNOSIS — N99820 Postprocedural hemorrhage and hematoma of a genitourinary system organ or structure following a genitourinary system procedure: Secondary | ICD-10-CM

## 2016-06-22 DIAGNOSIS — K661 Hemoperitoneum: Secondary | ICD-10-CM

## 2016-06-22 DIAGNOSIS — N939 Abnormal uterine and vaginal bleeding, unspecified: Secondary | ICD-10-CM | POA: Insufficient documentation

## 2016-06-22 LAB — CBC
HEMATOCRIT: 26.3 % — AB (ref 36.0–46.0)
Hemoglobin: 9.5 g/dL — ABNORMAL LOW (ref 12.0–15.0)
MCH: 30.4 pg (ref 26.0–34.0)
MCHC: 36.1 g/dL — AB (ref 30.0–36.0)
MCV: 84.3 fL (ref 78.0–100.0)
PLATELETS: 189 10*3/uL (ref 150–400)
RBC: 3.12 MIL/uL — ABNORMAL LOW (ref 3.87–5.11)
RDW: 13.8 % (ref 11.5–15.5)
WBC: 9.8 10*3/uL (ref 4.0–10.5)

## 2016-06-22 LAB — TYPE AND SCREEN
ABO/RH(D): O POS
Antibody Screen: NEGATIVE

## 2016-06-22 NOTE — MAU Note (Signed)
Pt here with post op vaginal bleeding; has had some small clots, lightheadedness when standing.

## 2016-06-22 NOTE — MAU Provider Note (Signed)
Chief Complaint: Vaginal Bleeding   First Provider Initiated Contact with Patient 06/22/16 0414     SUBJECTIVE HPI: Brittney Holmes is a 21 y.o. G3P1011 at 4 days post-op Ex-lap for ruptured cornual ectopic in 06/18/16 who presents to Maternity Admissions reporting increased vaginal bleeding while walking around [employer].   Severity: As heavy as a period Duration: 1 hour Context: post-op Associated signs and symptoms: Neg for abd pain, fever, chills, dizziness.  Past Medical History:  Diagnosis Date  . Chlamydia infection 05/31/2013  . Mild preeclampsia 10/07/2012  . Trichomonas infection 02/14/2014   OB History  Gravida Para Term Preterm AB Living  0 1 1  SAB TAB Ectopic Multiple Live Births  1 0 0 0 1    # Outcome Date GA Lbr Len/2nd Weight Sex Delivery Anes PTL Lv  3 Gravida           2 Term 10/06/12 [redacted]w[redacted]d 03:42 / 01:18 4 lb 15.8 oz (2.262 kg) F Vag-Spont EPI  LIV  1 SAB              Past Surgical History:  Procedure Laterality Date  . LAPAROTOMY  06/18/2016   Procedure: EXPLORATORY LAPAROTOMY, RIGHT CORNEAL WEDGE RESECTION,RIGHT SALPINGECTOMY;  Surgeon: Tereso Newcomer, MD;  Location: [facility];  Service: Gynecology;;   Social History   Social History  . Marital status: Single    Spouse name: N/A  . Number of children: N/A  . Years of education: N/A   Occupational History  . Not on file.   Social History Main Topics  . Smoking status: Never Smoker  . Smokeless tobacco: Never Used  . Alcohol use No  . Drug use: No  . Sexual activity: No    Birth control/ protection: None   Other Topics Concern  . Not on file   Social History Narrative  . No narrative on file   Family History  Problem Relation Age of Onset  . Cancer Maternal Grandmother     lung   No current facility-administered medications on file prior to encounter.    Current Outpatient Prescriptions on File Prior to Encounter  Medication Sig Dispense Refill  . oxyCODONE (ROXICODONE) 5  MG/5ML solution Take 10 mLs (10 mg total) by mouth every 6 (six) hours as needed for severe pain. 300 mL 0  . docusate (COLACE) 50 MG/5ML liquid Take 10 mLs (100 mg total) by mouth 2 (two) times daily as needed for mild constipation. 100 mL 2  . ferrous sulfate 300 (60 Fe) MG/5ML syrup Take 5 mLs (300 mg total) by mouth 2 (two) times daily with a meal. 150 mL 3  . ibuprofen (ADVIL,MOTRIN) 100 MG/5ML suspension Take 30 mLs (600 mg total) by mouth every 6 (six) hours as needed. 473 mL 3   No Known Allergies  I have reviewed patient's Past Medical Hx, Surgical Hx, Family Hx, Social Hx, medications and allergies.   Review of Systems  Constitutional: Negative for chills and fever.  Gastrointestinal: Negative for abdominal distention and abdominal pain.  Genitourinary: Positive for vaginal bleeding. Negative for vaginal discharge and vaginal pain.  Skin: Negative for pallor.  Neurological: Negative for dizziness.    OBJECTIVE Patient Vitals for the past 24 hrs:  BP Temp Temp src Pulse Resp SpO2 Height Weight  06/22/16 0353 128/90 98.1 F (36.7 C) Oral 79 18 99 %  (1.626 m) 99 lb (44.9 kg)   Constitutional: Well-developed, well-nourished female in no acute distress.  Skin:  No pallor Cardiovascular: normal rate Respiratory: normal rate and effort.  GI: Abd soft, non-tender, Honeycomb dressing 1/2 saturated w/ old blood and falling off. Removed--incision dry, intact, steri-strips in place. No erythema or swelling.  MS: Extremities nontender, no edema, normal ROM Neurologic: Alert and oriented x 4.  GU:  SPECULUM EXAM: NEFG, small amount of bright red blood noted, cervix clean, closed  LAB RESULTS Results for orders placed or performed during the hospital encounter of 06/22/16 (from the past 24 hour(s))  CBC     Status: Abnormal   Collection Time: 06/22/16  4:35 AM  Result Value Ref Range   WBC 9.8 4.0 - 10.5 K/uL   RBC 3.12 (L) 3.87 - 5.11 MIL/uL   Hemoglobin 9.5 (L) 12.0 - 15.0  g/dL   HCT 16.1 (L) 09.6 - 04.5 %   MCV 84.3 78.0 - 100.0 fL   MCH 30.4 26.0 - 34.0 pg   MCHC 36.1 (H) 30.0 - 36.0 g/dL   RDW 40.9 81.1 - 91.4 %   Platelets 189 150 - 400 K/uL    IMAGING NA MAU COURSE Orders Placed This Encounter  Procedures  . CBC  . Type and screen   No orders of the defined types were placed in this encounter.   MDM - Increased vaginal bleeding likely due to overexertion. Small amount of active bleeding, hemodynamically stable. No evidence of emergent condition.   ASSESSMENT  PLAN Discharge home in stable condition. Bleeding precautions   Follow-up Information    [facility] Follow up on 06/27/2016.   Specialty:  Obstetrics and Gynecology Why:  bloodwork Contact information: [address] [place] [address] [phone]       [facility] [place] [facility]ERN[facility] ADMISSIONS Follow up.   Why:  in Ob/Gyn emergencies Contact information: [address] [id number] mc [place] [address] [phone]         Allergies as of 06/22/2016   No Known Allergies     Medication List    TAKE these medications   docusate 50 MG/5ML liquid Commonly known as:  COLACE Take 10 mLs (100 mg total) by mouth 2 (two) times daily as needed for mild constipation.   ferrous sulfate 300 (60 Fe) MG/5ML syrup Take 5 mLs (300 mg total) by mouth 2 (two) times daily with a meal.   ibuprofen 100 MG/5ML suspension Commonly known as:  ADVIL,MOTRIN Take 30 mLs (600 mg total) by mouth every 6 (six) hours as needed.   oxyCODONE 5 MG/5ML solution Commonly known as:  ROXICODONE Take 10 mLs (10 mg total) by mouth every 6 (six) hours as needed for severe pain.      Dorathy Kinsman, CNM 06/22/2016  5:00 AM

## 2016-06-22 NOTE — Discharge Instructions (Signed)
Outpatient Surgery, Adult, Care After These instructions provide you with information about caring for yourself after your procedure. Your health care provider may also give you more specific instructions. Your treatment has been planned according to current medical practices, but problems sometimes occur. Call your health care provider if you have any problems or questions after your procedure. What can I expect after the procedure? After the procedure, it is common to have:  Tenderness and numbness at the surgical site.  Swelling and bruising around the surgical site.  Nausea. Follow these instructions at home: For at least 24 hours after the procedure:    Do not:  Participate in activities where you could fall or become injured.  Drive.  Use heavy machinery.  Drink alcohol.  Take sleeping pills or medicines that cause drowsiness.  Make important decisions or sign legal documents.  Take care of children on your own.  Rest. Activity   Return to your normal activities as told by your health care provider. Ask your health care provider what activities are safe for you.  Do not lift anything that is heavier than 10 lb (4.5 kg), or the limit that your health care provider tells you, until your health care provider says it is okay. Do not play contact sports until your health care provider says it is okay. (6 weeks)  Incision care   Follow instructions from your health care provider about how to take care of an incision, if you have one. Make sure you:  Wash your hands with soap and water before you change your bandage (dressing). If soap and water are not available, use hand sanitizer.  Change your dressing as told by your health care provider.  Leave stitches (sutures), skin glue, or adhesive strips in place. These skin closures may need to stay in place for 1 week or longer. If adhesive strip edges start to loosen and curl up, you may trim the loose edges. Do not remove  adhesive strips completely unless your health care provider tells you to do that (1 week).  Check your incision area every day for signs of infection. Check for:  More redness, swelling, or pain.  More fluid or blood.  Warmth.  Pus or a bad smell. Medicines   Take over-the-counter and prescription medicines only as told by your health care provider.  Do not drive or use heavy machinery while taking prescription pain medicines. Eating and drinking   If you vomit:  Drink water, juice, or soup when you can drink without vomiting.  Make sure you have little or no nausea before eating solid foods.  Follow the diet recommended by your health care provider. General instructions    Do not use any tobacco products, such as cigarettes, chewing tobacco, and e-cigarettes, for as long as possible.  If you smoke, do not smoke without supervision.  Keep all follow-up visits as told by your health care provider. This is important. Contact a health care provider if:  You have more redness, swelling, or pain around your incision.  You have more fluid or blood coming from your incision.  Your incision feels warm to the touch.  You have pus or a bad smell coming from your incision.  You have a fever.  You feel light-headed or you faint.  You develop a rash.  You keep feeling nauseous or keep vomiting.  You have very bad pain, even after taking the medicines your health care provider has prescribed or recommended.  You have constipation. Get  help right away if:  You have trouble breathing. This information is not intended to replace advice given to you by your health care provider. Make sure you discuss any questions you have with your health care provider. Document Released: 06/07/2015 Document Revised: 10/07/2015 Document Reviewed: 06/07/2015 Elsevier Interactive Patient Education  2017 [employer].

## 2016-06-23 LAB — CULTURE, BLOOD (ROUTINE X 2)
CULTURE: NO GROWTH
SPECIAL REQUESTS: ADEQUATE

## 2016-06-24 ENCOUNTER — Inpatient Hospital Stay (HOSPITAL_COMMUNITY)
Admission: AD | Admit: 2016-06-24 | Discharge: 2016-06-24 | Disposition: A | Discharge status: Home / Self Care | Payer: Medicaid Other | Source: Ambulatory Visit | Attending: Family Medicine | Admitting: Family Medicine

## 2016-06-24 ENCOUNTER — Encounter (HOSPITAL_COMMUNITY): Payer: Self-pay | Admitting: *Deleted

## 2016-06-24 DIAGNOSIS — Z9889 Other specified postprocedural states: Secondary | ICD-10-CM | POA: Diagnosis not present

## 2016-06-24 DIAGNOSIS — N939 Abnormal uterine and vaginal bleeding, unspecified: Secondary | ICD-10-CM | POA: Diagnosis not present

## 2016-06-24 DIAGNOSIS — Z9079 Acquired absence of other genital organ(s): Secondary | ICD-10-CM | POA: Insufficient documentation

## 2016-06-24 LAB — URINALYSIS, ROUTINE W REFLEX MICROSCOPIC
BILIRUBIN URINE: NEGATIVE
Bacteria, UA: NONE SEEN
GLUCOSE, UA: NEGATIVE mg/dL
KETONES UR: NEGATIVE mg/dL
Nitrite: NEGATIVE
PROTEIN: NEGATIVE mg/dL
Specific Gravity, Urine: 1.015 (ref 1.005–1.030)
pH: 8 (ref 5.0–8.0)

## 2016-06-24 LAB — CBC
HCT: 27.5 % — ABNORMAL LOW (ref 36.0–46.0)
HEMOGLOBIN: 9.7 g/dL — AB (ref 12.0–15.0)
MCH: 30.1 pg (ref 26.0–34.0)
MCHC: 35.3 g/dL (ref 30.0–36.0)
MCV: 85.4 fL (ref 78.0–100.0)
PLATELETS: 308 10*3/uL (ref 150–400)
RBC: 3.22 MIL/uL — ABNORMAL LOW (ref 3.87–5.11)
RDW: 13.6 % (ref 11.5–15.5)
WBC: 10.2 10*3/uL (ref 4.0–10.5)

## 2016-06-24 MED ORDER — ACETAMINOPHEN 160 MG/5ML PO SOLN
650.0000 mg | Freq: Once | ORAL | Status: AC
  Administered 2016-06-24: 650 mg via ORAL
  Filled 2016-06-24: qty 20.3

## 2016-06-24 MED ORDER — OXYCODONE HCL 5 MG/5ML PO SOLN
5.0000 mg | ORAL | Status: DC | PRN
  Administered 2016-06-24: 5 mg via ORAL
  Filled 2016-06-24 (×2): qty 5

## 2016-06-24 NOTE — MAU Provider Note (Signed)
History     CSN: [id number]  Arrival date and time: 06/24/16 4098   First Provider Initiated Contact with Patient 06/24/16 0158      Chief Complaint  Patient presents with  . Vaginal Bleeding    passing clots after ectopic pregnancy removal    Brittney Holmes is a 21 y.o. [id number] at who is S/P laparotomy for right corunal ectopic on 06/18/16. This her second MAU visit since being DC home. She reports that she has passed 2 blood clots today. She reports that one of the clots was about the size of a 50 cent piece and one was about the size of a quarter. Patient expresses concern that these blood clots could indicate that she had a blood clot in her bloodstream. She also was not expecting any vaginal bleeding post operatively.    Vaginal Bleeding  The patient's primary symptoms include pelvic pain and vaginal bleeding. This is a new problem. The current episode started yesterday. The problem occurs intermittently. The problem has been unchanged. Pain severity now: 10/10  Pregnant now: S/P ectopic pregnancy removal.  Associated symptoms include dysuria ("my stiches feel funny when I have to pee"). Pertinent negatives include no chills, fever, frequency, nausea, urgency or vomiting. The vaginal discharge was bloody. The vaginal bleeding is typical of menses. She has been passing clots. She has not been passing tissue. Nothing aggravates the symptoms. She has tried oral narcotics for the symptoms. The treatment provided significant relief.   Past Medical History:  Diagnosis Date  . Chlamydia infection 05/31/2013  . Mild preeclampsia 10/07/2012  . Trichomonas infection 02/14/2014    Past Surgical History:  Procedure Laterality Date  . LAPAROTOMY  06/18/2016   Procedure: EXPLORATORY LAPAROTOMY, RIGHT CORNEAL WEDGE RESECTION,RIGHT SALPINGECTOMY;  Surgeon: Brittney Newcomer, MD;  Location: [facility];  Service: Gynecology;;    Family History  Problem Relation Age of Onset  . Cancer Maternal Grandmother      lung    Social History  Substance Use Topics  . Smoking status: Never Smoker  . Smokeless tobacco: Never Used  . Alcohol use No    Allergies: No Known Allergies  Prescriptions Prior to Admission  Medication Sig Dispense Refill Last Dose  . docusate (COLACE) 50 MG/5ML liquid Take 10 mLs (100 mg total) by mouth 2 (two) times daily as needed for mild constipation. 100 mL 2 Past Week at Unknown time  . ibuprofen (ADVIL,MOTRIN) 100 MG/5ML suspension Take 30 mLs (600 mg total) by mouth every 6 (six) hours as needed. 473 mL 3 Past Week at Unknown time  . oxyCODONE (ROXICODONE) 5 MG/5ML solution Take 10 mLs (10 mg total) by mouth every 6 (six) hours as needed for severe pain. 300 mL 0 06/24/2016 at Unknown time  . ferrous sulfate 300 (60 Fe) MG/5ML syrup Take 5 mLs (300 mg total) by mouth 2 (two) times daily with a meal. 150 mL 3     Review of Systems  Constitutional: Negative for chills and fever.  Gastrointestinal: Negative for nausea and vomiting.  Genitourinary: Positive for dysuria ("my stiches feel funny when I have to pee"), pelvic pain and vaginal bleeding. Negative for frequency and urgency.   Physical Exam   Blood pressure (!) 117/91, pulse 91, temperature 98.1 F (36.7 C), temperature source Oral, resp. rate 17, SpO2 100 %, unknown if currently breastfeeding.  Physical Exam  Nursing note and vitals reviewed. Constitutional: She is oriented to person, place, and time. She appears well-developed and well-nourished. No distress.  HENT:  Head: Normocephalic.  Cardiovascular: Normal rate.   Respiratory: Effort normal.  GI: Soft. There is no tenderness. There is no rebound.  Steri-strips still covering incision. One was peeling off so this was removed, and revealed a well healing incision.   Neurological: She is alert and oriented to person, place, and time.  Skin: Skin is warm and dry.  Psychiatric: She has a normal mood and affect.   Results for orders placed or performed  during the hospital encounter of 06/24/16 (from the past 24 hour(s))  Urinalysis, Routine w reflex microscopic     Status: Abnormal   Collection Time: 06/24/16  1:42 AM  Result Value Ref Range   Color, Urine YELLOW YELLOW   APPearance CLEAR CLEAR   Specific Gravity, Urine 1.015 1.005 - 1.030   pH 8.0 5.0 - 8.0   Glucose, UA NEGATIVE NEGATIVE mg/dL   Hgb urine dipstick MODERATE (A) NEGATIVE   Bilirubin Urine NEGATIVE NEGATIVE   Ketones, ur NEGATIVE NEGATIVE mg/dL   Protein, ur NEGATIVE NEGATIVE mg/dL   Nitrite NEGATIVE NEGATIVE   Leukocytes, UA MODERATE (A) NEGATIVE   RBC / HPF 6-30 0 - 5 RBC/hpf   WBC, UA TOO NUMEROUS TO COUNT 0 - 5 WBC/hpf   Bacteria, UA NONE SEEN NONE SEEN   Squamous Epithelial / LPF 0-5 (A) NONE SEEN   Mucous PRESENT   CBC     Status: Abnormal   Collection Time: 06/24/16  2:20 AM  Result Value Ref Range   WBC 10.2 4.0 - 10.5 K/uL   RBC 3.22 (L) 3.87 - 5.11 MIL/uL   Hemoglobin 9.7 (L) 12.0 - 15.0 g/dL   HCT 16.1 (L) 09.6 - 04.5 %   MCV 85.4 78.0 - 100.0 fL   MCH 30.1 26.0 - 34.0 pg   MCHC 35.3 30.0 - 36.0 g/dL   RDW 40.9 81.1 - 91.4 %   Platelets 308 150 - 400 K/uL    MAU Course  Procedures  MDM  D/W the patient and family at length today that blood clots in vaginal bleeding are not a sign of blood clots in the blood stream. They feel very reassured by this. In addition, patient was advised that she would now have a period since the pregnancy had ended, and the lining of the uterus needed to be shed. She was again reassured as she had expected no vaginal bleeding after surgery.  Assessment and Plan   1. Vaginal bleeding    DC home Comfort measures reviewed   Bleeding precautions RX: none, continue current meds. Please pick up iron from pharmacy  Return to MAU as needed FU with OB as planned  Follow-up Information    [facility] Follow up.   Specialty:  Obstetrics and Gynecology Contact information: [address] [place] [address] [phone]           Brittney Holmes 06/24/2016, 1:59 AM

## 2016-06-24 NOTE — MAU Note (Signed)
Pt presents to MAU via EMS today stating she is passing blood clots post surgical procedure to remove a ectopic pregnancy. Pts surgery was on 06/18/16 and started bleeding on 06/22/16. Pt states the clots are bigger than a 50 cent piece (pt has pictures available). Pt states her lower back is hurting on a scale of 0-10 she states its a 10 hurts to stand. As well as pt c/o pain under her right breast at a scale 5 out of a 10, pt states its hard to take a deep breath without hurting in her chest.

## 2016-06-24 NOTE — Progress Notes (Signed)
Pt voiced that she feels as if she should be treated for depression after this incident. Pt is not in danger of hurting herself or others. Provider notified about pt request and will inform pt how to f/u after leaving MAU.

## 2016-06-27 ENCOUNTER — Other Ambulatory Visit: Payer: Medicaid Other

## 2016-06-27 DIAGNOSIS — O008 Other ectopic pregnancy without intrauterine pregnancy: Secondary | ICD-10-CM

## 2016-06-28 LAB — BETA HCG QUANT (REF LAB): hCG Quant: 805 m[IU]/mL

## 2016-07-06 ENCOUNTER — Encounter: Payer: Medicaid Other | Admitting: Obstetrics and Gynecology

## 2016-07-07 ENCOUNTER — Other Ambulatory Visit: Payer: Self-pay | Admitting: Obstetrics & Gynecology

## 2016-07-07 ENCOUNTER — Ambulatory Visit (INDEPENDENT_AMBULATORY_CARE_PROVIDER_SITE_OTHER): Payer: Medicaid Other | Admitting: Obstetrics & Gynecology

## 2016-07-07 ENCOUNTER — Other Ambulatory Visit (HOSPITAL_COMMUNITY)
Admission: RE | Admit: 2016-07-07 | Discharge: 2016-07-07 | Disposition: A | Discharge status: Home / Self Care | Payer: Medicaid Other | Source: Ambulatory Visit | Attending: Obstetrics & Gynecology | Admitting: Obstetrics & Gynecology

## 2016-07-07 ENCOUNTER — Encounter: Payer: Self-pay | Admitting: Obstetrics & Gynecology

## 2016-07-07 VITALS — BP 100/72 | HR 79 | Ht 65.0 in | Wt 88.4 lb

## 2016-07-07 DIAGNOSIS — B9689 Other specified bacterial agents as the cause of diseases classified elsewhere: Secondary | ICD-10-CM

## 2016-07-07 DIAGNOSIS — N898 Other specified noninflammatory disorders of vagina: Secondary | ICD-10-CM

## 2016-07-07 DIAGNOSIS — O008 Other ectopic pregnancy without intrauterine pregnancy: Secondary | ICD-10-CM

## 2016-07-07 DIAGNOSIS — N760 Acute vaginitis: Secondary | ICD-10-CM | POA: Diagnosis not present

## 2016-07-07 DIAGNOSIS — Z113 Encounter for screening for infections with a predominantly sexual mode of transmission: Secondary | ICD-10-CM

## 2016-07-07 DIAGNOSIS — Z09 Encounter for follow-up examination after completed treatment for conditions other than malignant neoplasm: Secondary | ICD-10-CM | POA: Diagnosis not present

## 2016-07-07 MED ORDER — METRONIDAZOLE 0.75 % VA GEL: 1.0000 | Freq: Every day | VAGINAL | 1 refills | Status: DC

## 2016-07-07 NOTE — Patient Instructions (Addendum)
Thank you for enrolling in MyChart. Please follow the instructions below to securely access your online medical record. MyChart allows you to send messages to your doctor, view your test results, manage appointments, and more.   How Do I Sign Up? 1. In your Internet browser, go to [employer] and enter [email or web address]. 2. Click on the Sign Up Now link in the Sign In box. You will see the New Member Sign Up page. 3. Enter your MyChart Access Code exactly as it appears below. You will not need to use this code after you've completed the sign-up process. If you do not sign up before the expiration date, you must request a new code.  MyChart Access Code: [email or web address] Expires: 07/16/2016  4:05 PM  4. Enter your Social Security Number ([email or web address]) and Date of Birth (mm/dd/yyyy) as indicated and click Submit. You will be taken to the next sign-up page. 5. Create a MyChart ID. This will be your MyChart login ID and cannot be changed, so think of one that is secure and easy to remember. 6. Create a MyChart password. You can change your password at any time. 7. Enter your Password Reset Question and Answer. This can be used at a later time if you forget your password.  8. Enter your e-mail address. You will receive e-mail notification when new information is available in MyChart. 9. Click Sign Up. You can now view your medical record.   Additional Information Remember, MyChart is NOT to be used for urgent needs. For medical emergencies, dial 911.      Intrauterine Device Information An intrauterine device (IUD) is inserted into your uterus to prevent pregnancy. There are two types of IUDs available:  Copper IUD-This type of IUD is wrapped in copper wire and is placed inside the uterus. Copper makes the uterus and fallopian tubes produce a fluid that kills sperm. The copper IUD can stay in place for 10 years.  Hormone IUD-This type of IUD contains the hormone progestin  (synthetic progesterone). The hormone thickens the cervical mucus and prevents sperm from entering the uterus. It also thins the uterine lining to prevent implantation of a fertilized egg. The hormone can weaken or kill the sperm that get into the uterus. One type of hormone IUD can stay in place for 5 years. Your health care provider will make sure you are a good candidate for a contraceptive IUD. Discuss with your health care provider the possible side effects. Advantages of an intrauterine device  IUDs are highly effective, reversible, long acting, and low maintenance.  There are no estrogen-related side effects.  An IUD can be used when breastfeeding.  IUDs are not associated with weight gain.  The copper IUD works immediately after insertion.  The hormone IUD works right away if inserted within 7 days of your period starting. You will need to use a backup method of birth control for 7 days if the hormone IUD is inserted at any other time in your cycle.  The copper IUD does not interfere with your female hormones.  The hormone IUD can make heavy menstrual periods lighter and decrease cramping.  The hormone IUD can be used for 3 or 5 years.  The copper IUD can be used for 10 years. Disadvantages of an intrauterine device  The hormone IUD can be associated with irregular bleeding patterns.  The copper IUD can make your menstrual flow heavier and more painful.  You may experience cramping and vaginal bleeding after insertion. This information is not  intended to replace advice given to you by your health care provider. Make sure you discuss any questions you have with your health care provider. Document Released: 01/19/2004 Document Revised: 07/23/2015 Document Reviewed: 08/05/2012 Elsevier Interactive Patient Education  2017 [employer]

## 2016-07-07 NOTE — Progress Notes (Signed)
Subjective:    Brittney Holmes is a 21 y.o. [id number] female who presents to the clinic 4 weeks status post Exploratory laparotomy, right cornual wedge resection and salpingectomy for ruptured right cornual ectopic pregnancy.  Eating a regular diet with difficulty. Bowel movements are normal. The patient is not having any pain.   Reported having malodorous, yellow, vaginal discharge for a few days.  The following portions of the patient's history were reviewed and updated as appropriate: allergies, current medications, past family history, past medical history, past social history, past surgical history and problem list.  Normal pap on 03/18/2016.  Review of Systems Pertinent items noted in HPI and remainder of comprehensive ROS otherwise negative.    Objective:    BP 100/72   Pulse 79   Ht 5\' 5"  (1.651 m)   Wt 88 lb 6.4 oz (40.1 kg)   LMP  (LMP Unknown)   Breastfeeding? No   BMI 14.71 kg/m  General:  alert and no distress  Abdomen: soft, bowel sounds active, non-tender  Incision:   healing well, no drainage, no erythema, no hernia, no seroma, no swelling, no dehiscence, incision well approximated  Pelvic: yellow discharge seen, no erythema.Testing sample obtained.    06/18/16 Surgical Pathology  CHORIONIC VILLI ARE PRESENT CONSISTENT WITH RUPTURED CORNUAL ECTOPIC PREGNANCY REACTIVE FALLOPIAN TUBE  Assessment:   Doing well postoperatively. Operative findings again reviewed. Pathology report discussed.   Vaginal discharge, likely BV   Plan:   1. Continue any current medications. 2. Wound care discussed. 3. Activity restrictions: none 4. Anticipated return to work: now. 5. Discussed need for cesarean delivery with any subsequent pregnancies given wedge resection.  6. Will obtain HCG today for surveillance and make sure there is no residual pregnancy tissue. 7. Metronidazole prescribed for presumptive BV; will follow up wet prep 8. Follow up as needed.   Jaynie CollinsUGONNA  Jocelyn Lowery, MD,  FACOG Attending Obstetrician & Gynecologist, [facility], [facility]

## 2016-07-08 LAB — CERVICOVAGINAL ANCILLARY ONLY
Bacterial vaginitis: NEGATIVE
CANDIDA VAGINITIS: NEGATIVE
Chlamydia: NEGATIVE
Neisseria Gonorrhea: NEGATIVE
Trichomonas: POSITIVE — AB

## 2016-07-08 LAB — BETA HCG QUANT (REF LAB): hCG Quant: 50 m[IU]/mL

## 2016-07-09 MED ORDER — METRONIDAZOLE 50 MG/ML ORAL SUSPENSION: 500.0000 mg | Freq: Two times a day (BID) | ORAL | 1 refills | Status: DC

## 2016-07-09 NOTE — Addendum Note (Signed)
Addended by: Jaynie CollinsANYANWU, UGONNA A on: 07/09/2016 05:49 PM   Modules accepted: Orders

## 2016-07-09 NOTE — Progress Notes (Addendum)
Results Note  Results for orders placed or performed in visit on 07/07/16 (from the past 168 hour(s))  Beta HCG, Quant   Collection Time: 07/07/16  1:46 PM  Result Value Ref Range   hCG Quant 50 mIU/mL  Results for orders placed or performed in visit on 07/07/16 (from the past 168 hour(s))  Cervicovaginal ancillary only   Collection Time: 07/07/16 12:00 AM  Result Value Ref Range   Chlamydia Negative    Neisseria gonorrhea Negative    Bacterial vaginitis Negative for Bacterial Vaginitis Microorganisms    Candida vaginitis Negative for Candida species    Trichomonas **POSITIVE** (A)    Patient has Trichomonas infection.  She was called and told about this diagnosis; recommended testing for other STIs, also needs to let partner(s) know so the partner(s) can get testing and treatment. Patient and sex partner(s) should abstain from unprotected sexual activity for seven days after everyone receives appropriate treatment.  Oral Metronidazole was prescribed for patient; she was told that vaginal Metronidazole was not considered effective therapy for trichomonas infection. Patient cannot take pills, refused to crush tablets, had to call several pharmacies but they did not have the oral suspension.  Was finally able to talk to [facility]; they will compound this medication and it will be ready on Monday, 07/12/15. Patient is agreeable to this plan.  Medication was called in to this pharmacy.  Patient needs to have weekly HCG checks given her cornual pregnancy.  She is planning to come in on 5/17 or 5/18 for repeat HCG.  When she arrives, she will also get HIV, Hep B, Hep C and RPR screening done. A message was sent to the [facility] and Clinical pools informing them of the need for this lab draw appointment.  Jaynie CollinsUGONNA  [name], MD, FACOG Attending Obstetrician & Gynecologist, [facility], [facility]

## 2016-07-11 ENCOUNTER — Telehealth: Payer: Self-pay

## 2016-07-11 NOTE — Telephone Encounter (Signed)
-----   Message from Tereso NewcomerUgonna A [name], MD sent at 07/09/2016 10:47 AM EDT ----- Pateint needs to return weekly until HCG is less than 5.  Please call to inform patient of results and recommendations.

## 2016-07-11 NOTE — Telephone Encounter (Signed)
Patient informed of lab results and the need to have weekly quant check until her level is less than 5. Patient voice understanding and will follow up on May 17.

## 2016-07-14 ENCOUNTER — Other Ambulatory Visit: Payer: Medicaid Other

## 2016-07-17 ENCOUNTER — Encounter: Payer: Self-pay | Admitting: Obstetrics & Gynecology

## 2016-07-21 ENCOUNTER — Encounter: Payer: Medicaid Other | Admitting: Obstetrics and Gynecology

## 2016-08-11 ENCOUNTER — Ambulatory Visit: Payer: Medicaid Other | Admitting: Obstetrics & Gynecology

## 2016-08-11 ENCOUNTER — Encounter: Payer: Self-pay | Admitting: Obstetrics & Gynecology

## 2016-08-11 ENCOUNTER — Telehealth: Payer: Self-pay | Admitting: *Deleted

## 2016-08-11 NOTE — Telephone Encounter (Signed)
Patient did not keep appt today for iud insertion. Per Dr. Macon LargeAnyanwu pt should avoid getting pregnant for at least 6 months. Need to call her and make sure that she is on some form of birth control, or to reschedule her if she would like to. Called patient and left message to return my call.

## 2016-08-17 NOTE — Telephone Encounter (Signed)
Called patient- she stated her cell phone was broken but she plans to make an appointment in the next couple weeks.

## 2016-08-19 ENCOUNTER — Emergency Department (HOSPITAL_COMMUNITY): Payer: Medicaid Other

## 2016-08-19 ENCOUNTER — Encounter (HOSPITAL_COMMUNITY): Payer: Self-pay

## 2016-08-19 DIAGNOSIS — R1013 Epigastric pain: Secondary | ICD-10-CM | POA: Diagnosis not present

## 2016-08-19 DIAGNOSIS — R079 Chest pain, unspecified: Secondary | ICD-10-CM | POA: Diagnosis present

## 2016-08-19 DIAGNOSIS — K30 Functional dyspepsia: Secondary | ICD-10-CM | POA: Insufficient documentation

## 2016-08-19 LAB — CBC
HCT: 34 % — ABNORMAL LOW (ref 36.0–46.0)
Hemoglobin: 10.8 g/dL — ABNORMAL LOW (ref 12.0–15.0)
MCH: 26.6 pg (ref 26.0–34.0)
MCHC: 31.8 g/dL (ref 30.0–36.0)
MCV: 83.7 fL (ref 78.0–100.0)
PLATELETS: 398 10*3/uL (ref 150–400)
RBC: 4.06 MIL/uL (ref 3.87–5.11)
RDW: 12.8 % (ref 11.5–15.5)
WBC: 8.8 10*3/uL (ref 4.0–10.5)

## 2016-08-19 LAB — BASIC METABOLIC PANEL
ANION GAP: 6 (ref 5–15)
BUN: 5 mg/dL — ABNORMAL LOW (ref 6–20)
CALCIUM: 9.5 mg/dL (ref 8.9–10.3)
CO2: 23 mmol/L (ref 22–32)
CREATININE: 0.72 mg/dL (ref 0.44–1.00)
Chloride: 108 mmol/L (ref 101–111)
Glucose, Bld: 96 mg/dL (ref 65–99)
Potassium: 3.4 mmol/L — ABNORMAL LOW (ref 3.5–5.1)
SODIUM: 137 mmol/L (ref 135–145)

## 2016-08-19 LAB — I-STAT TROPONIN, ED: TROPONIN I, POC: 0 ng/mL (ref 0.00–0.08)

## 2016-08-19 NOTE — ED Triage Notes (Signed)
Pt complaining of L sided chest pain x 1 hour. Pt states pain began after eating chinese food. Pt states some SOB. Denies any cough, N/V/D.

## 2016-08-20 ENCOUNTER — Emergency Department (HOSPITAL_COMMUNITY)
Admission: EM | Admit: 2016-08-20 | Discharge: 2016-08-20 | Disposition: A | Discharge status: Home / Self Care | Payer: Medicaid Other | Attending: Emergency Medicine | Admitting: Emergency Medicine

## 2016-08-20 DIAGNOSIS — R1013 Epigastric pain: Secondary | ICD-10-CM

## 2016-08-20 MED ORDER — GI COCKTAIL ~~LOC~~
30.0000 mL | Freq: Once | ORAL | Status: AC
  Administered 2016-08-20: 30 mL via ORAL
  Filled 2016-08-20: qty 30

## 2016-08-20 NOTE — ED Notes (Signed)
Pt ambulatory to restroom with steady gait.

## 2016-08-20 NOTE — ED Notes (Signed)
EKG not exported correctly from triage machine. Repeat EKG done in the hallway, waiting for EKG to export. Paper copy of EKG available.

## 2016-08-20 NOTE — ED Notes (Signed)
Pt only able to tolerate approx 20-25 ml of ordered GI cocktail. Pt states "this is so nasty, I can't do it."

## 2016-08-28 NOTE — ED Provider Notes (Signed)
[facility]-EMERGENCY DEPT Provider Note   CSN: [id number] Arrival date & time: 08/19/16  2054     History   Chief Complaint Chief Complaint  Patient presents with  . Chest Pain  . Shortness of Breath    HPI Brittney Holmes is a 21 y.o. female.  Patient to the ED with complaint of chest pain that started about 1/2 hour after eating a meal of [place] food. Nausea with vomiting. No SOB, pain with respirations, fever, cough. She denies history of stomach problems. No abdominal pain, specifically no RUQ abdominal pain. She states the chest pain radiates to her back.    The history is provided by the patient. No language interpreter was used.  Chest Pain   Associated symptoms include nausea. Pertinent negatives include no abdominal pain, no diaphoresis, no fever, no shortness of breath and no vomiting.  Shortness of Breath  Associated symptoms include chest pain. Pertinent negatives include no fever, no vomiting and no abdominal pain.    Past Medical History:  Diagnosis Date  . Chlamydia infection 05/31/2013  . 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 Mild preeclampsia 10/07/2012  . Trichomonas infection 02/14/2014    Patient Active Problem List   Diagnosis Date Noted  . Hemoperitoneum due to rupture of right cornual ectopic pregnancy 06/18/2016  . 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 LAPAROTOMY  06/18/2016   Procedure: EXPLORATORY LAPAROTOMY, RIGHT CORNEAL WEDGE RESECTION,RIGHT SALPINGECTOMY;  Surgeon: Tereso NewcomerUgonna A [name], MD;  Location: [facility];  Service: Gynecology;;    OB History    Gravida Para Term Preterm AB Living   3 1 1  0 2 1   SAB TAB Ectopic Multiple Live Births   1 0 1 0 1       Home Medications    Prior to Admission medications   Medication Sig Start Date End Date Taking? Authorizing Provider  docusate (COLACE) 50 MG/5ML liquid Take 10 mLs (100 mg total) by mouth 2 (two) times daily as  needed for mild constipation. Patient not taking: Reported on 07/07/2016 06/20/16   [name], Jethro BastosUgonna A, MD  ferrous sulfate 300 (60 Fe) MG/5ML syrup Take 5 mLs (300 mg total) by mouth 2 (two) times daily with a meal. Patient not taking: Reported on 07/07/2016 06/20/16   [name], Jethro BastosUgonna A, MD  ibuprofen (ADVIL,MOTRIN) 100 MG/5ML suspension Take 30 mLs (600 mg total) by mouth every 6 (six) hours as needed. Patient not taking: Reported on 07/07/2016 06/20/16   [name], Jethro BastosUgonna A, MD  metroNIDAZOLE (FLAGYL) 50 mg/ml oral suspension Take 10 mLs (500 mg total) by mouth 2 (two) times daily. For seven days 07/09/16   [name], Jethro BastosUgonna A, MD  metroNIDAZOLE (METROGEL) 0.75 % vaginal gel Place 1 Applicatorful vaginally at bedtime. Apply one applicatorful to vagina at bedtime for 5 days 07/07/16   [name], Jethro BastosUgonna A, MD  oxyCODONE (ROXICODONE) 5 MG/5ML solution Take 10 mLs (10 mg total) by mouth every 6 (six) hours as needed for severe pain. Patient not taking: Reported on 07/07/2016 06/20/16   Tereso NewcomerAnyanwu, Ugonna A, MD    Family History Family History  Problem Relation Age of Onset  . Cancer Maternal Grandmother        lung    Social History Social History  Substance Use Topics  . Smoking status: Never Smoker  . Smokeless tobacco: Never Used  . Alcohol use No     Allergies   Patient has no known allergies.  Review of Systems Review of Systems  Constitutional: Negative for chills, diaphoresis and fever.  Respiratory: Negative.  Negative for shortness of breath.   Cardiovascular: Positive for chest pain.  Gastrointestinal: Positive for nausea. Negative for abdominal pain and vomiting.  Musculoskeletal: Negative.   Skin: Negative.   Neurological: Negative.  Negative for light-headedness.     Physical Exam Updated Vital Signs BP 111/62 (BP Location: Left Arm)   Pulse 82   Temp 98 F (36.7 C) (Oral)   Resp 14   SpO2 100%   Physical Exam  Constitutional: She appears well-developed and  well-nourished.  HENT:  Head: Normocephalic.  Neck: Normal range of motion. Neck supple.  Cardiovascular: Normal rate and regular rhythm.   No murmur heard. Pulmonary/Chest: Effort normal and breath sounds normal. She has no wheezes. She has no rales. She exhibits no tenderness.  Abdominal: Soft. Bowel sounds are normal. There is no tenderness. There is no rebound and no guarding.  Musculoskeletal: Normal range of motion.  Neurological: She is alert.  Skin: Skin is warm and dry. No rash noted.  Psychiatric: She has a normal mood and affect.     ED Treatments / Results  Labs (all labs ordered are listed, but only abnormal results are displayed) Labs Reviewed  BASIC METABOLIC PANEL - Abnormal; Notable for the following:       Result Value   Potassium 3.4 (*)    BUN 5 (*)    All other components within normal limits  CBC - Abnormal; Notable for the following:    Hemoglobin 10.8 (*)    HCT 34.0 (*)    All other components within normal limits  I-STAT TROPOININ, ED    EKG  EKG Interpretation  Date/Time:  Saturday August 20 2016 00:17:32 EDT Ventricular Rate:  78 PR Interval:  118 QRS Duration: 76 QT Interval:  372 QTC Calculation: 424 R Axis:   92 Text Interpretation:  Normal sinus rhythm with sinus arrhythmia Rightward axis Septal infarct , age undetermined Abnormal ECG No significant change since last tracing Confirmed by Ross Marcus ([id number]) on 08/20/2016 3:12:48 PM       Radiology No results found.  Procedures Procedures (including critical care time)  Medications Ordered in ED Medications  gi cocktail (Maalox,Lidocaine,Donnatal) (30 mLs Oral Given 08/20/16 0206)     Initial Impression / Assessment and Plan / ED Course  I have reviewed the triage vital signs and the nursing notes.  Pertinent labs & imaging results that were available during my care of the patient were reviewed by me and considered in my medical decision making (see chart for details).       Patient with chest pain after eating [place] food. She is very well appearing, in NAD, VSS. No EKG changes, labs reassuring. She reports pain is improving over time and is resolved with GI cocktail.   Doubt ACS. Pain likely related to dyspepsia. Will recommend prilosec and PCP follow up.  Final Clinical Impressions(s) / ED Diagnoses   Final diagnoses:  Dyspepsia    New Prescriptions Discharge Medication List as of 08/20/2016  2:37 AM       Elpidio Anis, PA-C 08/28/16 0605    Ward, Layla Maw, DO 08/28/16 4782

## 2016-09-14 ENCOUNTER — Emergency Department (HOSPITAL_COMMUNITY)
Admission: EM | Admit: 2016-09-14 | Discharge: 2016-09-14 | Disposition: A | Discharge status: Home / Self Care | Payer: Medicaid Other | Attending: Emergency Medicine | Admitting: Emergency Medicine

## 2016-09-14 ENCOUNTER — Encounter (HOSPITAL_COMMUNITY): Payer: Self-pay | Admitting: Emergency Medicine

## 2016-09-14 DIAGNOSIS — R1013 Epigastric pain: Secondary | ICD-10-CM | POA: Diagnosis not present

## 2016-09-14 LAB — CBC
HCT: 37.3 % (ref 36.0–46.0)
Hemoglobin: 12 g/dL (ref 12.0–15.0)
MCH: 26.3 pg (ref 26.0–34.0)
MCHC: 32.2 g/dL (ref 30.0–36.0)
MCV: 81.6 fL (ref 78.0–100.0)
PLATELETS: 239 10*3/uL (ref 150–400)
RBC: 4.57 MIL/uL (ref 3.87–5.11)
RDW: 13.7 % (ref 11.5–15.5)
WBC: 9.7 10*3/uL (ref 4.0–10.5)

## 2016-09-14 LAB — COMPREHENSIVE METABOLIC PANEL
ALBUMIN: 4.5 g/dL (ref 3.5–5.0)
ALK PHOS: 54 U/L (ref 38–126)
ALT: 9 U/L — ABNORMAL LOW (ref 14–54)
ANION GAP: 10 (ref 5–15)
AST: 26 U/L (ref 15–41)
BILIRUBIN TOTAL: 1.6 mg/dL — AB (ref 0.3–1.2)
BUN: 6 mg/dL (ref 6–20)
CALCIUM: 9.6 mg/dL (ref 8.9–10.3)
CO2: 20 mmol/L — ABNORMAL LOW (ref 22–32)
CREATININE: 0.61 mg/dL (ref 0.44–1.00)
Chloride: 107 mmol/L (ref 101–111)
GFR calc Af Amer: >60 mL/min (ref >60)
GFR calc non Af Amer: >60 mL/min (ref >60)
GLUCOSE: 82 mg/dL (ref 65–99)
Potassium: 4.4 mmol/L (ref 3.5–5.1)
Sodium: 137 mmol/L (ref 135–145)
TOTAL PROTEIN: 8.1 g/dL (ref 6.5–8.1)

## 2016-09-14 LAB — URINALYSIS, ROUTINE W REFLEX MICROSCOPIC
Bacteria, UA: NONE SEEN
Bilirubin Urine: NEGATIVE
GLUCOSE, UA: NEGATIVE mg/dL
HGB URINE DIPSTICK: NEGATIVE
Ketones, ur: 80 mg/dL — AB
NITRITE: NEGATIVE
Protein, ur: NEGATIVE mg/dL
SPECIFIC GRAVITY, URINE: 1.028 (ref 1.005–1.030)
pH: 5 (ref 5.0–8.0)

## 2016-09-14 LAB — I-STAT BETA HCG BLOOD, ED (MC, WL, AP ONLY)

## 2016-09-14 LAB — LIPASE, BLOOD: Lipase: 15 U/L (ref 11–51)

## 2016-09-14 MED ORDER — OMEPRAZOLE 20 MG PO CPDR: DELAYED_RELEASE_CAPSULE | ORAL | 0 refills | Status: DC

## 2016-09-14 NOTE — ED Provider Notes (Signed)
[facility]-EMERGENCY DEPT Provider Note   CSN: [id number] Arrival date & time: 09/14/16  1758     History   Chief Complaint Chief Complaint  Patient presents with  . Abdominal Pain    HPI Brittney Holmes is a 21 y.o. female.  Patient here for evaluation of epigastric abdominal pain that started this morning around 10:00 am after eating a fatty meal. She described pain as sharp and radiating to the back. No fever. No vomiting, diarrhea. The pain lasted most of today but resolved without intervention. She remains asymptomatic currently. She reports similar symptoms in the past.    The history is provided by the patient. No language interpreter was used.  Abdominal Pain   Pertinent negatives include fever, nausea and vomiting.    Past Medical History:  Diagnosis Date  . Chlamydia infection 05/31/2013  . 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 Mild preeclampsia 10/07/2012  . Trichomonas infection 02/14/2014    Patient Active Problem List   Diagnosis Date Noted  . Hemoperitoneum due to rupture of right cornual ectopic pregnancy 06/18/2016  . 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 LAPAROTOMY  06/18/2016   Procedure: EXPLORATORY LAPAROTOMY, RIGHT CORNEAL WEDGE RESECTION,RIGHT SALPINGECTOMY;  Surgeon: Tereso NewcomerUgonna A [name], MD;  Location: [facility];  Service: Gynecology;;    OB History    Gravida Para Term Preterm AB Living   3 1 1  0 2 1   SAB TAB Ectopic Multiple Live Births   1 0 1 0 1       Home Medications    Prior to Admission medications   Medication Sig Start Date End Date Taking? Authorizing Provider  docusate (COLACE) 50 MG/5ML liquid Take 10 mLs (100 mg total) by mouth 2 (two) times daily as needed for mild constipation. Patient not taking: Reported on 07/07/2016 06/20/16   [name], Jethro BastosUgonna A, MD  ferrous sulfate 300 (60 Fe) MG/5ML syrup Take 5 mLs (300 mg total) by mouth 2 (two) times daily with a  meal. Patient not taking: Reported on 07/07/2016 06/20/16   [name], Jethro BastosUgonna A, MD  ibuprofen (ADVIL,MOTRIN) 100 MG/5ML suspension Take 30 mLs (600 mg total) by mouth every 6 (six) hours as needed. Patient not taking: Reported on 07/07/2016 06/20/16   [name], Jethro BastosUgonna A, MD  metroNIDAZOLE (FLAGYL) 50 mg/ml oral suspension Take 10 mLs (500 mg total) by mouth 2 (two) times daily. For seven days Patient not taking: Reported on 09/14/2016 07/09/16   Tereso NewcomerAnyanwu, Ugonna A, MD  metroNIDAZOLE (METROGEL) 0.75 % vaginal gel Place 1 Applicatorful vaginally at bedtime. Apply one applicatorful to vagina at bedtime for 5 days Patient not taking: Reported on 09/14/2016 07/07/16   [name], Jethro BastosUgonna A, MD  oxyCODONE (ROXICODONE) 5 MG/5ML solution Take 10 mLs (10 mg total) by mouth every 6 (six) hours as needed for severe pain. Patient not taking: Reported on 07/07/2016 06/20/16   Tereso NewcomerAnyanwu, Ugonna A, MD    Family History Family History  Problem Relation Age of Onset  . Cancer Maternal Grandmother        lung    Social History Social History  Substance Use Topics  . Smoking status: Never Smoker  . Smokeless tobacco: Never Used  . Alcohol use No     Allergies   Patient has no known allergies.   Review of Systems Review of Systems  Constitutional: Negative for chills and fever.  HENT: Negative.   Respiratory: Negative.   Cardiovascular: Negative.  Gastrointestinal: Positive for abdominal pain. Negative for nausea and vomiting.  Musculoskeletal: Negative.   Skin: Negative.   Neurological: Negative.      Physical Exam Updated Vital Signs BP 117/74 (BP Location: Right Arm)   Pulse (!) 106   Temp 98.5 F (36.9 C) (Oral)   Resp 17   SpO2 100%   Physical Exam  Constitutional: She appears well-developed and well-nourished.  HENT:  Head: Normocephalic.  Neck: Normal range of motion. Neck supple.  Pulmonary/Chest: Effort normal. She exhibits no tenderness.  Abdominal: Soft. Bowel sounds are normal.  There is no tenderness. There is no rebound and no guarding.  There is no abdominal tenderness to light or deep palpation.  Musculoskeletal: Normal range of motion.  Neurological: She is alert. No cranial nerve deficit.  Skin: Skin is warm and dry. No rash noted.  Psychiatric: She has a normal mood and affect.     ED Treatments / Results  Labs (all labs ordered are listed, but only abnormal results are displayed) Labs Reviewed  COMPREHENSIVE METABOLIC PANEL - Abnormal; Notable for the following:       Result Value   CO2 20 (*)    ALT 9 (*)    Total Bilirubin 1.6 (*)    All other components within normal limits  LIPASE, BLOOD  CBC  URINALYSIS, ROUTINE W REFLEX MICROSCOPIC  I-STAT BETA HCG BLOOD, ED ([facility], [facility], [facility] ONLY)   Results for orders placed or performed during the hospital encounter of 09/14/16  Lipase, blood  Result Value Ref Range   Lipase 15 11 - 51 U/L  Comprehensive metabolic panel  Result Value Ref Range   Sodium 137 135 - 145 mmol/L   Potassium 4.4 3.5 - 5.1 mmol/L   Chloride 107 101 - 111 mmol/L   CO2 20 (L) 22 - 32 mmol/L   Glucose, Bld 82 65 - 99 mg/dL   BUN 6 6 - 20 mg/dL   Creatinine, Ser 1.61 0.44 - 1.00 mg/dL   Calcium 9.6 8.9 - 09.6 mg/dL   Total Protein 8.1 6.5 - 8.1 g/dL   Albumin 4.5 3.5 - 5.0 g/dL   AST 26 15 - 41 U/L   ALT 9 (L) 14 - 54 U/L   Alkaline Phosphatase 54 38 - 126 U/L   Total Bilirubin 1.6 (H) 0.3 - 1.2 mg/dL   GFR calc non Af Amer >60 >60 mL/min   GFR calc Af Amer >60 >60 mL/min   Anion gap 10 5 - 15  CBC  Result Value Ref Range   WBC 9.7 4.0 - 10.5 K/uL   RBC 4.57 3.87 - 5.11 MIL/uL   Hemoglobin 12.0 12.0 - 15.0 g/dL   HCT 04.5 40.9 - 81.1 %   MCV 81.6 78.0 - 100.0 fL   MCH 26.3 26.0 - 34.0 pg   MCHC 32.2 30.0 - 36.0 g/dL   RDW 91.4 78.2 - 95.6 %   Platelets 239 150 - 400 K/uL  Urinalysis, Routine w reflex microscopic  Result Value Ref Range   Color, Urine YELLOW YELLOW   APPearance CLEAR CLEAR   Specific Gravity, Urine  1.028 1.005 - 1.030   pH 5.0 5.0 - 8.0   Glucose, UA NEGATIVE NEGATIVE mg/dL   Hgb urine dipstick NEGATIVE NEGATIVE   Bilirubin Urine NEGATIVE NEGATIVE   Ketones, ur 80 (A) NEGATIVE mg/dL   Protein, ur NEGATIVE NEGATIVE mg/dL   Nitrite NEGATIVE NEGATIVE   Leukocytes, UA MODERATE (A) NEGATIVE   RBC / HPF 0-5  0 - 5 RBC/hpf   WBC, UA TOO NUMEROUS TO COUNT 0 - 5 WBC/hpf   Bacteria, UA NONE SEEN NONE SEEN   Squamous Epithelial / LPF 0-5 (A) NONE SEEN   Mucous PRESENT   I-Stat beta hCG blood, ED  Result Value Ref Range   I-stat hCG, quantitative <5.0 <5 mIU/mL   Comment 3            EKG  EKG Interpretation None       Radiology No results found.  Procedures Procedures (including critical care time)  Medications Ordered in ED Medications - No data to display   Initial Impression / Assessment and Plan / ED Course  I have reviewed the triage vital signs and the nursing notes.  Pertinent labs & imaging results that were available during my care of the patient were reviewed by me and considered in my medical decision making (see chart for details).     Patient here with epigastric abdominal pain after eating a high fat meal earlier today. Symptoms resolved over time. No vomiting, diarrhea.   Chart reviewed. The patient has been seen for similar symptoms in the recent past. At that time symptoms resolved with GI Cocktail. Diagnosis was dyspepsia. Feel today's symptoms are similar, related to food, self limited. Discussed treatment when or if symptoms recur. Discussed return precautions.   She has leukorrhea on UA but no bacteria and no urinary symptoms. Culture pending.   She is stable for discharge home.   Final Clinical Impressions(s) / ED Diagnoses   Final diagnoses:  None   1. Dyspepsia   New Prescriptions New Prescriptions   No medications on file     Danne Harbor 09/14/16 2322    Mancel Bale, MD 09/14/16 [phone]

## 2016-09-14 NOTE — ED Triage Notes (Signed)
Pt from home via EMS with complaints of generalized abdominal pain following eating chicken nuggets and fries at 1000 this morning. Pt states pain feels similar to when she had ectopic pregnancy 1 month ago. Pt denies nausea, emesis, and diarrhea.

## 2016-10-05 ENCOUNTER — Encounter (HOSPITAL_COMMUNITY): Payer: Self-pay | Admitting: Emergency Medicine

## 2016-10-05 DIAGNOSIS — R51 Headache: Secondary | ICD-10-CM | POA: Diagnosis present

## 2016-10-05 DIAGNOSIS — F430 Acute stress reaction: Secondary | ICD-10-CM | POA: Diagnosis not present

## 2016-10-06 ENCOUNTER — Emergency Department (HOSPITAL_COMMUNITY)
Admission: EM | Admit: 2016-10-06 | Discharge: 2016-10-06 | Disposition: A | Discharge status: Home / Self Care | Payer: Medicaid Other | Attending: Emergency Medicine | Admitting: Emergency Medicine

## 2016-10-06 DIAGNOSIS — F430 Acute stress reaction: Secondary | ICD-10-CM

## 2016-10-06 NOTE — ED Provider Notes (Signed)
[facility] DEPT Provider Note   CSN: [id number] Arrival date & time: 10/05/16  2206     History   Chief Complaint Chief Complaint  Patient presents with  . Anxiety  . Otalgia  . Headache    HPI Brittney Holmes is a 21 y.o. female.  Patient is a 21 year old female with no significant past medical history presenting for evaluation of chest discomfort, anxiety, and feeling stressed. She reports to me that she broke up with her boyfriend 3 days ago and this has been upsetting to her. She denies any difficulty breathing. She denies any suicidal or homicidal ideation.   The history is provided by the patient.  Anxiety  This is a new problem. The current episode started 2 days ago. The problem occurs constantly. The problem has been gradually worsening. Nothing aggravates the symptoms. Nothing relieves the symptoms.    Past Medical History:  Diagnosis Date  . Chlamydia infection 05/31/2013  . 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 Mild preeclampsia 10/07/2012  . Trichomonas infection 02/14/2014    Patient Active Problem List   Diagnosis Date Noted  . Hemoperitoneum due to rupture of right cornual ectopic pregnancy 06/18/2016  . 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 LAPAROTOMY  06/18/2016   Procedure: EXPLORATORY LAPAROTOMY, RIGHT CORNEAL WEDGE RESECTION,RIGHT SALPINGECTOMY;  Surgeon: Tereso NewcomerUgonna A [name], MD;  Location: [facility];  Service: Gynecology;;    OB History    Gravida Para Term Preterm AB Living   3 1 1  0 2 1   SAB TAB Ectopic Multiple Live Births   1 0 1 0 1       Home Medications    Prior to Admission medications   Medication Sig Start Date End Date Taking? Authorizing Provider  docusate (COLACE) 50 MG/5ML liquid Take 10 mLs (100 mg total) by mouth 2 (two) times daily as needed for mild constipation. Patient not taking: Reported on 07/07/2016 06/20/16   [name], Jethro BastosUgonna A, MD  ferrous  sulfate 300 (60 Fe) MG/5ML syrup Take 5 mLs (300 mg total) by mouth 2 (two) times daily with a meal. Patient not taking: Reported on 07/07/2016 06/20/16   [name], Jethro BastosUgonna A, MD  ibuprofen (ADVIL,MOTRIN) 100 MG/5ML suspension Take 30 mLs (600 mg total) by mouth every 6 (six) hours as needed. Patient not taking: Reported on 07/07/2016 06/20/16   [name], Jethro BastosUgonna A, MD  metroNIDAZOLE (FLAGYL) 50 mg/ml oral suspension Take 10 mLs (500 mg total) by mouth 2 (two) times daily. For seven days Patient not taking: Reported on 09/14/2016 07/09/16   Tereso NewcomerAnyanwu, Ugonna A, MD  metroNIDAZOLE (METROGEL) 0.75 % vaginal gel Place 1 Applicatorful vaginally at bedtime. Apply one applicatorful to vagina at bedtime for 5 days Patient not taking: Reported on 09/14/2016 07/07/16   [name], Jethro BastosUgonna A, MD  omeprazole (PRILOSEC) 20 MG capsule Take once daily for the next 5 days. 09/14/16   Elpidio AnisUpstill, Shari, PA-C  oxyCODONE (ROXICODONE) 5 MG/5ML solution Take 10 mLs (10 mg total) by mouth every 6 (six) hours as needed for severe pain. Patient not taking: Reported on 07/07/2016 06/20/16   Tereso NewcomerAnyanwu, Ugonna A, MD    Family History Family History  Problem Relation Age of Onset  . Cancer Maternal Grandmother        lung    Social History Social History  Substance Use Topics  . Smoking status: Never Smoker  . Smokeless tobacco: Never Used  . Alcohol use No  Allergies   Patient has no known allergies.   Review of Systems Review of Systems  All other systems reviewed and are negative.    Physical Exam Updated Vital Signs BP 116/81 (BP Location: Right Arm)   Pulse 75   Temp 98 F (36.7 C) (Oral)   Resp 16   Ht 5\' 4"  (1.626 m)   Wt 43.5 kg (96 lb)   LMP 10/04/2016   SpO2 100%   BMI 16.48 kg/m   Physical Exam  Constitutional: She is oriented to person, place, and time. She appears well-developed and well-nourished. No distress.  HENT:  Head: Normocephalic and atraumatic.  Mouth/Throat: Oropharynx is clear and  moist.  Eyes: Pupils are equal, round, and reactive to light. EOM are normal.  Neck: Normal range of motion. Neck supple.  Cardiovascular: Normal rate and regular rhythm.  Exam reveals no gallop and no friction rub.   No murmur heard. Pulmonary/Chest: Effort normal and breath sounds normal. No respiratory distress. She has no wheezes.  Abdominal: Soft. Bowel sounds are normal. She exhibits no distension. There is no tenderness.  Musculoskeletal: Normal range of motion.  Neurological: She is alert and oriented to person, place, and time.  Skin: Skin is warm and dry. She is not diaphoretic.  Nursing note and vitals reviewed.    ED Treatments / Results  Labs (all labs ordered are listed, but only abnormal results are displayed) Labs Reviewed - No data to display  EKG  EKG Interpretation None       Radiology No results found.  Procedures Procedures (including critical care time)  Medications Ordered in ED Medications - No data to display   Initial Impression / Assessment and Plan / ED Course  I have reviewed the triage vital signs and the nursing notes.  Pertinent labs & imaging results that were available during my care of the patient were reviewed by me and considered in my medical decision making (see chart for details).  Patient presents with complaints of feeling stressed over the recent and of her relationship with her boyfriend. I see nothing on physical examination or in her history that makes me concerned that there is anything physical occurring. She is not suicidal or homicidal. I see no indication for any testing or psychiatric evaluation. She will be discharged and is to follow-up as needed.  Final Clinical Impressions(s) / ED Diagnoses   Final diagnoses:  None    New Prescriptions New Prescriptions   No medications on file     Geoffery Lyons, MD 10/06/16 [phone]

## 2016-10-06 NOTE — Discharge Instructions (Signed)
Follow-up with your primary Dr. if not improving in the next few days, and return to the ER symptoms significantly worsen or change.

## 2016-10-16 ENCOUNTER — Emergency Department (HOSPITAL_COMMUNITY): Payer: Medicaid Other

## 2016-10-16 ENCOUNTER — Encounter (HOSPITAL_COMMUNITY): Payer: Self-pay | Admitting: Emergency Medicine

## 2016-10-16 ENCOUNTER — Emergency Department (HOSPITAL_COMMUNITY)
Admission: EM | Admit: 2016-10-16 | Discharge: 2016-10-16 | Disposition: A | Discharge status: Home / Self Care | Payer: Medicaid Other | Attending: Emergency Medicine | Admitting: Emergency Medicine

## 2016-10-16 DIAGNOSIS — R1084 Generalized abdominal pain: Secondary | ICD-10-CM | POA: Diagnosis present

## 2016-10-16 DIAGNOSIS — R0789 Other chest pain: Secondary | ICD-10-CM | POA: Diagnosis not present

## 2016-10-16 DIAGNOSIS — R1013 Epigastric pain: Secondary | ICD-10-CM

## 2016-10-16 LAB — COMPREHENSIVE METABOLIC PANEL
ALBUMIN: 4.2 g/dL (ref 3.5–5.0)
ALT: 11 U/L — ABNORMAL LOW (ref 14–54)
ANION GAP: 7 (ref 5–15)
AST: 18 U/L (ref 15–41)
Alkaline Phosphatase: 53 U/L (ref 38–126)
BUN: 6 mg/dL (ref 6–20)
CHLORIDE: 108 mmol/L (ref 101–111)
CO2: 23 mmol/L (ref 22–32)
Calcium: 9.6 mg/dL (ref 8.9–10.3)
Creatinine, Ser: 0.57 mg/dL (ref 0.44–1.00)
GFR calc Af Amer: >60 mL/min (ref >60)
GFR calc non Af Amer: >60 mL/min (ref >60)
GLUCOSE: 110 mg/dL — AB (ref 65–99)
POTASSIUM: 3.7 mmol/L (ref 3.5–5.1)
Sodium: 138 mmol/L (ref 135–145)
Total Bilirubin: 1 mg/dL (ref 0.3–1.2)
Total Protein: 7.6 g/dL (ref 6.5–8.1)

## 2016-10-16 LAB — URINALYSIS, ROUTINE W REFLEX MICROSCOPIC
Bilirubin Urine: NEGATIVE
GLUCOSE, UA: NEGATIVE mg/dL
Hgb urine dipstick: NEGATIVE
KETONES UR: NEGATIVE mg/dL
LEUKOCYTES UA: NEGATIVE
Nitrite: NEGATIVE
PH: 6 (ref 5.0–8.0)
Protein, ur: NEGATIVE mg/dL
SPECIFIC GRAVITY, URINE: 1.002 — AB (ref 1.005–1.030)

## 2016-10-16 LAB — CBC
HEMATOCRIT: 32.1 % — AB (ref 36.0–46.0)
HEMOGLOBIN: 10.4 g/dL — AB (ref 12.0–15.0)
MCH: 26.2 pg (ref 26.0–34.0)
MCHC: 32.4 g/dL (ref 30.0–36.0)
MCV: 80.9 fL (ref 78.0–100.0)
Platelets: 447 10*3/uL — ABNORMAL HIGH (ref 150–400)
RBC: 3.97 MIL/uL (ref 3.87–5.11)
RDW: 14.7 % (ref 11.5–15.5)
WBC: 10.1 10*3/uL (ref 4.0–10.5)

## 2016-10-16 LAB — PREGNANCY, URINE: Preg Test, Ur: NEGATIVE

## 2016-10-16 LAB — LIPASE, BLOOD: LIPASE: 25 U/L (ref 11–51)

## 2016-10-16 MED ORDER — ONDANSETRON HCL 4 MG/2ML IJ SOLN
4.0000 mg | Freq: Once | INTRAMUSCULAR | Status: AC
  Administered 2016-10-16: 4 mg via INTRAVENOUS
  Filled 2016-10-16: qty 2

## 2016-10-16 MED ORDER — GI COCKTAIL ~~LOC~~
30.0000 mL | Freq: Once | ORAL | Status: AC
  Administered 2016-10-16: 30 mL via ORAL
  Filled 2016-10-16: qty 30

## 2016-10-16 MED ORDER — SODIUM CHLORIDE 0.9 % IV BOLUS (SEPSIS)
1000.0000 mL | Freq: Once | INTRAVENOUS | Status: AC
  Administered 2016-10-16: 1000 mL via INTRAVENOUS

## 2016-10-16 MED ORDER — FAMOTIDINE 20 MG PO TABS: 20.0000 mg | ORAL_TABLET | Freq: Every day | ORAL | 0 refills | Status: DC

## 2016-10-16 NOTE — ED Provider Notes (Signed)
[facility] Provider Note   CSN: [id number] Arrival date & time: 10/16/16  0447     History   Chief Complaint Chief Complaint  Patient presents with  . Abdominal Pain    HPI Brittney Holmes is a 21 y.o. female.  Pt presents to the ED today with abdominal pain.  Pt c/o also of cp.  Sx have been going on for about 1 month.  She denies f/c or n/v.      Past Medical History:  Diagnosis Date  . Chlamydia infection 05/31/2013  . 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 Mild preeclampsia 10/07/2012  . Trichomonas infection 02/14/2014    Patient Active Problem List   Diagnosis Date Noted  . Hemoperitoneum due to rupture of right cornual ectopic pregnancy 06/18/2016  . 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 LAPAROTOMY  06/18/2016   Procedure: EXPLORATORY LAPAROTOMY, RIGHT CORNEAL WEDGE RESECTION,RIGHT SALPINGECTOMY;  Surgeon: Tereso Newcomer, MD;  Location: [facility];  Service: Gynecology;;    OB History    Gravida Para Term Preterm AB Living   3 1 1  0 2 1   SAB TAB Ectopic Multiple Live Births   1 0 1 0 1       Home Medications    Prior to Admission medications   Medication Sig Start Date End Date Taking? Authorizing Provider  famotidine (PEPCID) 20 MG tablet Take 1 tablet (20 mg total) by mouth daily. 10/16/16   Jacalyn Lefevre, MD    Family History Family History  Problem Relation Age of Onset  . Cancer Maternal Grandmother        lung    Social History Social History  Substance Use Topics  . Smoking status: Never Smoker  . Smokeless tobacco: Never Used  . Alcohol use No     Allergies   Patient has no known allergies.   Review of Systems Review of Systems  Cardiovascular: Positive for chest pain.  Gastrointestinal: Positive for abdominal distention.  All other systems reviewed and are negative.    Physical Exam Updated Vital Signs BP 117/70 (BP Location: Left Arm)    Pulse 86   Temp 98.3 F (36.8 C) (Oral)   Resp 13   Ht 5\' 3"  (1.6 m)   Wt 39 kg (86 lb)   LMP 10/04/2016   SpO2 100%   BMI 15.23 kg/m   Physical Exam  Constitutional: She is oriented to person, place, and time. She appears well-developed and well-nourished.  HENT:  Head: Normocephalic and atraumatic.  Right Ear: External ear normal.  Left Ear: External ear normal.  Nose: Nose normal.  Mouth/Throat: Oropharynx is clear and moist.  Eyes: Pupils are equal, round, and reactive to light. Conjunctivae and EOM are normal.  Neck: Normal range of motion. Neck supple.  Cardiovascular: Normal rate, regular rhythm, normal heart sounds and intact distal pulses.   Pulmonary/Chest: Effort normal and breath sounds normal.  Abdominal: Soft. Bowel sounds are normal.  Musculoskeletal: Normal range of motion.  Neurological: She is alert and oriented to person, place, and time.  Skin: Skin is warm.  Psychiatric: She has a normal mood and affect. Her behavior is normal. Judgment and thought content normal.  Nursing note and vitals reviewed.    ED Treatments / Results  Labs (all labs ordered are listed, but only abnormal results are displayed) Labs Reviewed  COMPREHENSIVE METABOLIC PANEL - Abnormal; Notable for the following:  Result Value   Glucose, Bld 110 (*)    ALT 11 (*)    All other components within normal limits  CBC - Abnormal; Notable for the following:    Hemoglobin 10.4 (*)    HCT 32.1 (*)    Platelets 447 (*)    All other components within normal limits  URINALYSIS, ROUTINE W REFLEX MICROSCOPIC - Abnormal; Notable for the following:    Color, Urine STRAW (*)    Specific Gravity, Urine 1.002 (*)    All other components within normal limits  LIPASE, BLOOD  PREGNANCY, URINE  I-STAT BETA HCG BLOOD, ED ([facility], [facility], [facility] ONLY)    EKG  EKG Interpretation  Date/Time:  Sunday October 16 2016 07:43:06 EDT Ventricular Rate:  83 PR Interval:    QRS Duration: 72 QT  Interval:  357 QTC Calculation: 420 R Axis:   99 Text Interpretation:  Sinus rhythm Borderline right axis deviation Abnormal Q suggests anterior infarct No significant change since last tracing Confirmed by Jacalyn Lefevre [id number]) on 10/16/2016 8:13:40 AM       Radiology Dg Chest 2 View  Result Date: 10/16/2016 CLINICAL DATA:  Chest pain for 1 week. EXAM: CHEST  2 VIEW COMPARISON:  August 19, 2016 FINDINGS: The heart size and mediastinal contours are within normal limits. Both lungs are clear. The visualized skeletal structures are unremarkable. IMPRESSION: No active cardiopulmonary disease. Electronically Signed   By: Gerome Sam III M.D   On: 10/16/2016 07:56    Procedures Procedures (including critical care time)  Medications Ordered in ED Medications  sodium chloride 0.9 % bolus 1,000 mL (1,000 mLs Intravenous New Bag/Given 10/16/16 0834)  ondansetron (ZOFRAN) injection 4 mg (4 mg Intravenous Given 10/16/16 0834)  gi cocktail (Maalox,Lidocaine,Donnatal) (30 mLs Oral Given 10/16/16 2395)     Initial Impression / Assessment and Plan / ED Course  I have reviewed the triage vital signs and the nursing notes.  Pertinent labs & imaging results that were available during my care of the patient were reviewed by me and considered in my medical decision making (see chart for details).    Pt has iron pills at the pharmacy that she needs to pick up.  I told her to do that.  The pt feels better after treatment.  She will be d/c with pepcid.  She has not followed with gyn since April when she had her ruptured ectopic.  She is told to f/u with them.  She is also given the number of GI to f/u as she's been to the ED a few times in the last few months with dyspepsia sx.  She knows to return if worse.  Final Clinical Impressions(s) / ED Diagnoses   Final diagnoses:  Dyspepsia    New Prescriptions New Prescriptions   FAMOTIDINE (PEPCID) 20 MG TABLET    Take 1 tablet (20 mg total) by mouth  daily.     Jacalyn Lefevre, MD 10/16/16 [phone]

## 2016-10-16 NOTE — ED Triage Notes (Signed)
Patient is complaining of abdominal pain in the upper middle. Patient states that she has been having this pain for about a week. Patient is also complaining of left chest pain. Patient has low iron but is not taking medication for it.

## 2016-10-16 NOTE — ED Triage Notes (Signed)
Patient is complaining of abdominal pain that started 3 days ago. Patient states she has nausea and no vomiting. Patient states that she is anemic.

## 2016-10-31 NOTE — Progress Notes (Deleted)
   CC: ***  HPI: Brittney Holmes is a 21 y.o. female with PMH significant for corneal ectopic pregnancy s/p resection [facility] *** of *** duration.   Multiple ED visits recently.   GAD-7 -  PHQ.9 -    Overdue health maintenance ***  Review of Symptoms:  See HPI for ROS.   CC, SH/smoking status, and VS noted.  Objective: LMP 10/04/2016  GEN: NAD, alert, cooperative, and pleasant.*** EYE: no conjunctival injection, pupils equally round and reactive to light ENMT: normal tympanic light reflex, no nasal polyps,no rhinorrhea, no pharyngeal erythema or exudates NECK: full ROM, no thyromegaly RESPIRATORY: clear to auscultation bilaterally with no wheezes, rhonchi or rales, good effort CV: RRR, no m/r/g, no peripheral edema GI: soft, non-tender, non-distended, no hepatosplenomegaly SKIN: warm and dry, no rashes or lesions NEURO: II-XII grossly intact, normal gait, peripheral sensation intact PSYCH: AAOx3, appropriate affect  Flu Vaccine: *** Tdap Vaccine: *** - every 5456yrs - (<3 lifetime doses or unknown): all wounds -- look up need for Tetanus IG - (>=3 lifetime doses): clean/minor wound if >6656yrs from previous; all other wounds if >284yrs from previous Zoster Vaccine: *** (those >50yo, once) Pneumonia Vaccine: *** (those w/ risk factors) - (<7941yr) Both: Immunocompromised, cochlear implant, CSF leak, asplenic, sickle cell, Chronic Renal Failure - (<3841yr) PPSV-23 only: Heart dz, lung disease, DM, tobacco abuse, alcoholism, cirrhosis/liver disease. - (>1641yr): PPSV13 then PPSV23 in 6-12mths;  - (>8341yr): repeat PPSV23 once if pt received prior to 21yo and 124yrs have passed  Assessment and plan:  No problem-specific Assessment & Plan notes found for this encounter.   No orders of the defined types were placed in this encounter.   No orders of the defined types were placed in this encounter.    Howard PouchLauren Illyria Sobocinski, MD,MS,  PGY2 10/31/2016 9:07 AM

## 2016-11-01 ENCOUNTER — Ambulatory Visit: Payer: Self-pay | Admitting: Student in an Organized Health Care Education/Training Program

## 2016-11-07 ENCOUNTER — Encounter (HOSPITAL_COMMUNITY): Payer: Self-pay

## 2016-11-07 ENCOUNTER — Emergency Department (HOSPITAL_COMMUNITY)
Admission: EM | Admit: 2016-11-07 | Discharge: 2016-11-07 | Disposition: A | Discharge status: Home / Self Care | Payer: Medicaid Other | Attending: Emergency Medicine | Admitting: Emergency Medicine

## 2016-11-07 DIAGNOSIS — N760 Acute vaginitis: Secondary | ICD-10-CM | POA: Diagnosis not present

## 2016-11-07 DIAGNOSIS — A599 Trichomoniasis, unspecified: Secondary | ICD-10-CM

## 2016-11-07 DIAGNOSIS — R1013 Epigastric pain: Secondary | ICD-10-CM | POA: Diagnosis present

## 2016-11-07 DIAGNOSIS — K219 Gastro-esophageal reflux disease without esophagitis: Secondary | ICD-10-CM | POA: Diagnosis not present

## 2016-11-07 DIAGNOSIS — B9689 Other specified bacterial agents as the cause of diseases classified elsewhere: Secondary | ICD-10-CM | POA: Insufficient documentation

## 2016-11-07 DIAGNOSIS — Z202 Contact with and (suspected) exposure to infections with a predominantly sexual mode of transmission: Secondary | ICD-10-CM

## 2016-11-07 LAB — WET PREP, GENITAL
Sperm: NONE SEEN
Yeast Wet Prep HPF POC: NONE SEEN

## 2016-11-07 MED ORDER — LIDOCAINE-EPINEPHRINE (PF) 2 %-1:200000 IJ SOLN
INTRAMUSCULAR | Status: AC
  Administered 2016-11-07: 20 mL
  Filled 2016-11-07: qty 20

## 2016-11-07 MED ORDER — FAMOTIDINE 20 MG PO TABS: 20.0000 mg | ORAL_TABLET | Freq: Every day | ORAL | 0 refills | Status: DC

## 2016-11-07 MED ORDER — METRONIDAZOLE 500 MG PO TABS
500.0000 mg | ORAL_TABLET | Freq: Once | ORAL | Status: AC
  Administered 2016-11-07: 500 mg via ORAL
  Filled 2016-11-07: qty 1

## 2016-11-07 MED ORDER — AZITHROMYCIN 250 MG PO TABS
1000.0000 mg | ORAL_TABLET | Freq: Once | ORAL | Status: AC
  Administered 2016-11-07: 1000 mg via ORAL
  Filled 2016-11-07: qty 4

## 2016-11-07 MED ORDER — CEFTRIAXONE SODIUM 250 MG IJ SOLR
250.0000 mg | Freq: Once | INTRAMUSCULAR | Status: AC
  Administered 2016-11-07: 250 mg via INTRAMUSCULAR
  Filled 2016-11-07: qty 250

## 2016-11-07 MED ORDER — METRONIDAZOLE 500 MG PO TABS: 500.0000 mg | ORAL_TABLET | Freq: Two times a day (BID) | ORAL | 0 refills | Status: DC

## 2016-11-07 NOTE — ED Provider Notes (Signed)
[facility]-EMERGENCY DEPT Provider Note   CSN: [id number] Arrival date & time: 11/07/16  1519     History   Chief Complaint Chief Complaint  Patient presents with  . Gastroesophageal Reflux  . wants STD check  . Medication Refill    HPI Brittney Holmes is a 21 y.o. female.  The history is provided by the patient.  Gastroesophageal Reflux  This is a recurrent problem. Episode onset: 1 month. Episode frequency: intermittent. The problem has not changed since onset.Associated symptoms include abdominal pain (epigastric). Pertinent negatives include no chest pain and no shortness of breath. Nothing aggravates the symptoms. Relieved by: antiacids. Treatments tried: reports that she lost her Pepcid and it has been bothering her more since.  Medication Refill   Also reports STI exposure to Chlam by her boyfriend. Has Vaginal discharge for 2 weeks.  LMP 2 weeks ago.   Past Medical History:  Diagnosis Date  . Chlamydia infection 05/31/2013  . 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 Mild preeclampsia 10/07/2012  . Trichomonas infection 02/14/2014    Patient Active Problem List   Diagnosis Date Noted  . Hemoperitoneum due to rupture of right cornual ectopic pregnancy 06/18/2016  . 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 LAPAROTOMY  06/18/2016   Procedure: EXPLORATORY LAPAROTOMY, RIGHT CORNEAL WEDGE RESECTION,RIGHT SALPINGECTOMY;  Surgeon: Tereso NewcomerUgonna A [name], MD;  Location: [facility];  Service: Gynecology;;    OB History    Gravida Para Term Preterm AB Living   3 1 1  0 2 1   SAB TAB Ectopic Multiple Live Births   1 0 1 0 1       Home Medications    Prior to Admission medications   Medication Sig Start Date End Date Taking? Authorizing Provider  famotidine (PEPCID) 20 MG tablet Take 1 tablet (20 mg total) by mouth daily. 11/07/16   Dallys Nowakowski, Amadeo GarnetPedro Eduardo, MD  metroNIDAZOLE (FLAGYL) 500 MG tablet Take 1 tablet  (500 mg total) by mouth 2 (two) times daily. 11/07/16 11/14/16  Nira Connardama, Renwick Asman Eduardo, MD    Family History Family History  Problem Relation Age of Onset  . Cancer Maternal Grandmother        lung    Social History Social History  Substance Use Topics  . Smoking status: Never Smoker  . Smokeless tobacco: Never Used  . Alcohol use No     Allergies   Patient has no known allergies.   Review of Systems Review of Systems  Respiratory: Negative for shortness of breath.   Cardiovascular: Negative for chest pain.  Gastrointestinal: Positive for abdominal pain (epigastric).  Genitourinary: Positive for vaginal discharge.  All other systems are reviewed and are negative for acute change except as noted in the HPI    Physical Exam Updated Vital Signs BP 111/68 (BP Location: Left Arm)   Pulse 74   Temp 98.8 F (37.1 C) (Oral)   Resp 18   Ht 5\' 3"  (1.6 m)   Wt 39.7 kg (87 lb 8 oz)   LMP 10/28/2016   SpO2 100%   BMI 15.50 kg/m   Physical Exam  Constitutional: She is oriented to person, place, and time. She appears well-developed and well-nourished. No distress.  HENT:  Head: Normocephalic and atraumatic.  Nose: Nose normal.  Eyes: Pupils are equal, round, and reactive to light. Conjunctivae and EOM are normal. Right eye exhibits no discharge. Left eye exhibits no discharge. No scleral icterus.  Neck: Normal range of motion. Neck supple.  Cardiovascular: Normal rate and regular rhythm.  Exam reveals no gallop and no friction rub.   No murmur heard. Pulmonary/Chest: Effort normal and breath sounds normal. No stridor. No respiratory distress. She has no rales.  Abdominal: Soft. She exhibits no distension. There is no tenderness.  Genitourinary: Pelvic exam was performed with patient supine. Uterus is not tender. Cervix exhibits no motion tenderness, no discharge and no friability. Right adnexum displays no mass and no tenderness. Left adnexum displays no mass and no  tenderness. No erythema, tenderness or bleeding in the vagina. No foreign body in the vagina. Vaginal discharge (mild white) found.  Genitourinary Comments: Chaperone present during pelvic exam.   Musculoskeletal: She exhibits no edema or tenderness.  Neurological: She is alert and oriented to person, place, and time.  Skin: Skin is warm and dry. No rash noted. She is not diaphoretic. No erythema.  Psychiatric: She has a normal mood and affect.  Vitals reviewed.    ED Treatments / Results  Labs (all labs ordered are listed, but only abnormal results are displayed) Labs Reviewed  WET PREP, GENITAL - Abnormal; Notable for the following:       Result Value   Trich, Wet Prep PRESENT (*)    Clue Cells Wet Prep HPF POC PRESENT (*)    WBC, Wet Prep HPF POC FEW (*)    All other components within normal limits  GC/CHLAMYDIA PROBE AMP () NOT AT [facility]    EKG  EKG Interpretation None       Radiology No results found.  Procedures Procedures (including critical care time)  Medications Ordered in ED Medications  cefTRIAXone (ROCEPHIN) injection 250 mg (not administered)  azithromycin (ZITHROMAX) tablet 1,000 mg (not administered)  metroNIDAZOLE (FLAGYL) tablet 500 mg (not administered)     Initial Impression / Assessment and Plan / ED Course  I have reviewed the triage vital signs and the nursing notes.  Pertinent labs & imaging results that were available during my care of the patient were reviewed by me and considered in my medical decision making (see chart for details).     GERD. abd benign. No indication for labs or imaging at this time. Low suspicion for serious intra-abdominal inflammatory/infectious process.  Pelvic exam without evidence of cervicitis or PID. Wet prep positive for Trichomonas and bacterial vaginosis. We'll treat with 70 course of Flagyl. Given her exposure to chlamydia, we'll treat empirically with Rocephin and azithromycin.  Advised to  practice safe sex and have all partners evaluated and treated at the local health department. Also advised to follow with the health Department in 1-2 weeks to confirm effectiveness of treatment and receive additional education/evaluation. Return precautions given.   The patient is safe for discharge with strict return precautions.   Final Clinical Impressions(s) / ED Diagnoses   Final diagnoses:  Gastroesophageal reflux disease, esophagitis presence not specified  STD exposure  Trichomonal infection  Bacterial vaginosis   Disposition: Discharge  Condition: Good  I have discussed the results, Dx and Tx plan with the patient who expressed understanding and agree(s) with the plan. Discharge instructions discussed at great length. The patient was given strict return precautions who verbalized understanding of the instructions. No further questions at time of discharge.    New Prescriptions   METRONIDAZOLE (FLAGYL) 500 MG TABLET    Take 1 tablet (500 mg total) by mouth 2 (two) times daily.    Follow Up: Galen Manila, MD  Schedule  an appointment as soon as possible for a visit        Yuji Walth, Amadeo Garnet, MD 11/07/16 2046

## 2016-11-07 NOTE — ED Notes (Signed)
Pt ambulatory and independent at discharge.  Verbalized understanding of discharge instructions 

## 2016-11-07 NOTE — ED Triage Notes (Addendum)
Patient states she was taking Pepcid for reflux and her pill bottle fell in a hole in the wall. Patient also states she has issues with constipation. Last BM today, but reports that "It was hard as a rock."

## 2016-11-07 NOTE — Discharge Instructions (Addendum)
Practice safe sex and have all partners evaluated and treated at the local health department. You are also advised to follow with the health Department in 1-2 weeks to confirm effectiveness of treatment and receive additional education/evaluation.

## 2016-11-08 LAB — GC/CHLAMYDIA PROBE AMP (~~LOC~~) NOT AT ARMC
CHLAMYDIA, DNA PROBE: NEGATIVE
NEISSERIA GONORRHEA: NEGATIVE

## 2016-11-09 ENCOUNTER — Ambulatory Visit (INDEPENDENT_AMBULATORY_CARE_PROVIDER_SITE_OTHER): Payer: Medicaid Other | Admitting: Student in an Organized Health Care Education/Training Program

## 2016-11-09 ENCOUNTER — Encounter: Payer: Self-pay | Admitting: Student in an Organized Health Care Education/Training Program

## 2016-11-09 VITALS — BP 98/72 | HR 41 | Temp 98.2°F | Wt 88.0 lb

## 2016-11-09 DIAGNOSIS — K5900 Constipation, unspecified: Secondary | ICD-10-CM | POA: Diagnosis not present

## 2016-11-09 DIAGNOSIS — O008 Other ectopic pregnancy without intrauterine pregnancy: Secondary | ICD-10-CM | POA: Diagnosis not present

## 2016-11-09 DIAGNOSIS — F419 Anxiety disorder, unspecified: Secondary | ICD-10-CM | POA: Diagnosis not present

## 2016-11-09 DIAGNOSIS — B9689 Other specified bacterial agents as the cause of diseases classified elsewhere: Secondary | ICD-10-CM

## 2016-11-09 DIAGNOSIS — J069 Acute upper respiratory infection, unspecified: Secondary | ICD-10-CM | POA: Diagnosis not present

## 2016-11-09 DIAGNOSIS — N760 Acute vaginitis: Secondary | ICD-10-CM | POA: Diagnosis not present

## 2016-11-09 DIAGNOSIS — Z3009 Encounter for other general counseling and advice on contraception: Secondary | ICD-10-CM | POA: Diagnosis not present

## 2016-11-09 MED ORDER — METRONIDAZOLE 50 MG/ML ORAL SUSPENSION: 500.0000 mg | Freq: Two times a day (BID) | ORAL | 0 refills | Status: DC

## 2016-11-09 NOTE — Progress Notes (Signed)
CC: Ear Pain  HPI: Brittney Holmes is a 21 y.o. female with PMH significant for ruptured ectopic pregnancy who [facility] with [facility] pain and constipation, also discussed anxiety, birth control and recent diagnosis of BV.  Ear pain - patient endorses right-sided ear pain started 2 days ago. She endorses associated headaches, congestion, and rhinorrhea. She has no fevers, no sore throat, no nausea vomiting or diarrhea. No urinary symptoms. Her sister at home is also sick with a cough.   Constipation - patient endorses chronically hard, small, painful stools though she does have a bowel movement once daily.  Bacterial Vaginosis: Diagnosed by wet prep in the ED on 11/07/2016. Patient was given metronidazole to complete a seven-day course. However patient is unable to swallow pills, and therefore never took the medication. Denies vaginal symptoms today.  History of Ectopic pregnancy, does not desire pregnancy, not using contraception: Patient endorses being sexually active, not using contraception. Does not desire pregnancy. Interested in [place].  Frequent ED visits/Anxiety: Patient noted to have frequent ED visits, has been seen for chest pain which has been thought to be GERD versus anxiety.  - PH Q9 completed the office today was 7 with patient declining thoughts that she will be better off dead or thoughts of hurting herself. She scored 3 for trouble falling asleep, and 3 for feeling tired or having little energy. She scored 1 for poor appetite or overeating. - GAD 7 was completed in the office and patient scored 2, one for being unable to stop or control worrying, and 1 for worrying too much about different things.  Review of Symptoms:  See HPI for ROS.   CC, SH/smoking status, and VS noted.  Objective: BP 98/72   Pulse (!) 41   Temp 98.2 F (36.8 C) (Oral)   Wt 88 lb (39.9 kg)   LMP 10/28/2016   BMI 15.59 kg/m  GEN: NAD, alert, cooperative, and pleasant, well-appearing EYE:  no conjunctival injection, pupils equally round and reactive to light ENMT: +bil tympanic membrane effusions, + rhinorrhea, mild pharyngeal erythema without exudate, no sinus tenderness NECK: full ROM, no thyromegaly RESPIRATORY: clear to auscultation bilaterally with no wheezes, rhonchi or rales, good effort CV: RRR, no m/r/g, no peripheral edema GI: soft, non-tender, non-distended, no hepatosplenomegaly SKIN: warm and dry, no rashes or lesions NEURO: II-XII grossly intact, normal gait, peripheral sensation intact PSYCH: AAOx3, appropriate affect  Assessment and plan:  BV (bacterial vaginosis) - diagnosed in ED on 9/10 - patient prescribed 7 days 500 mg metronidazole BID, however pt states she is unable to swallow the pills - prescribed liquid metronidazole at today's visit and sent to pharmacy   Constipation - miralax 1 capful qdaily, titrate up as needed for 1-2 soft stools/day  Anxiety - multiple ED visits in which anxiety is mentioned, GAD7 of 2 in today's visit - discussed importance of following in our clinic for non-emergent medical problems, patient voiced understanding - continue to follow anxiety/address in more detail at follow up visit  Encounter for other general counseling and advice on contraception History of hemoperitoneum due to rupture of cornual ectopic pregnancy. Patient does not desire pregnancy however is sexually active currently and using no protection. - Patient declines depo provera injection at today's visit. - made f/u visit in procedures clinic for next week for mirena - patient agrees to use condoms for the next week   Meds ordered this encounter  Medications  . metroNIDAZOLE (FLAGYL) 50 mg/ml oral suspension  Sig: Take 10 mLs (500 mg total) by mouth 2 (two) times daily.    Dispense:  150 mL    Refill:  0    Can give one bottle.    Howard Pouch, MD,MS,  PGY2 11/10/2016 9:19 AM

## 2016-11-09 NOTE — Patient Instructions (Addendum)
It was a pleasure seeing you today in our clinic. Today we discussed your recent infection and your viral illness. I sent antibiotics for BV in liquid form to your pharmacy.  For constipation, use miralax over the counter. You can take one capful mixed in a drink each day.  Please arrive 15 minutes early for your appointment on 11/17/2016 at 10:00 AM for your IUD placement. Use condoms in the meantime.  Our clinic's number is [phone]. Please call with questions or concerns about what we discussed today.   Be well, Dr. Mosetta PuttFeng

## 2016-11-10 NOTE — Assessment & Plan Note (Signed)
-   diagnosed in ED on 9/10 - patient prescribed 7 days 500 mg metronidazole BID, however pt states she is unable to swallow the pills - prescribed liquid metronidazole at today's visit and sent to pharmacy

## 2016-11-10 NOTE — Assessment & Plan Note (Signed)
-   miralax 1 capful qdaily, titrate up as needed for 1-2 soft stools/day

## 2016-11-10 NOTE — Assessment & Plan Note (Signed)
-   multiple ED visits in which anxiety is mentioned, GAD7 of 2 in today's visit - discussed importance of following in our clinic for non-emergent medical problems, patient voiced understanding - continue to follow anxiety/address in more detail at follow up visit

## 2016-11-10 NOTE — Assessment & Plan Note (Signed)
History of hemoperitoneum due to rupture of cornual ectopic pregnancy. Patient does not desire pregnancy however is sexually active currently and using no protection. - Patient declines depo provera injection at today's visit. - made f/u visit in procedures clinic for next week for mirena - patient agrees to use condoms for the next week

## 2016-11-14 ENCOUNTER — Ambulatory Visit: Payer: Self-pay | Admitting: Obstetrics & Gynecology

## 2016-11-17 ENCOUNTER — Ambulatory Visit: Payer: Medicaid Other

## 2016-12-01 ENCOUNTER — Telehealth: Payer: Self-pay | Admitting: *Deleted

## 2016-12-01 MED ORDER — METRONIDAZOLE 50 MG/ML ORAL SUSPENSION: 500.0000 mg | Freq: Two times a day (BID) | ORAL | 0 refills | Status: DC

## 2016-12-01 NOTE — Telephone Encounter (Signed)
Patient called stating the pharmacy never received Rx for metronidazole PO oral suspension.  medication stated fax, pharmacy never received. Medication called into [facility].  Clovis Pu, RN

## 2016-12-06 ENCOUNTER — Ambulatory Visit: Payer: Self-pay | Admitting: Family Medicine

## 2016-12-07 NOTE — Telephone Encounter (Signed)
Pt called back to let us know [facility] still has not received med.   [facility]ed [facility], they have the rx but it was not filled.  Asked them to fill and pt would come to pick up in 30 minutes.  Pt informed and appreciative.  Fleeger, Maryjo Rochester, CMA

## 2016-12-19 NOTE — Telephone Encounter (Signed)
Pt has yet to pick up meds.  [facility] is having issues getting it in the liquid form.    They called and told her the name of a pharmacy, but she cant remember the name.  She did have to number but when I googled it an eye doctor came up.  She will call [facility] back and find out the name of the pharmacy. Aquarius Latouche, Brittney RochesterJessica Dawn, CMA

## 2017-01-02 ENCOUNTER — Ambulatory Visit: Payer: Self-pay | Admitting: Student in an Organized Health Care Education/Training Program

## 2017-01-11 ENCOUNTER — Ambulatory Visit: Payer: Medicaid Other | Admitting: Student in an Organized Health Care Education/Training Program

## 2017-01-27 DIAGNOSIS — J020 Streptococcal pharyngitis: Secondary | ICD-10-CM | POA: Diagnosis not present

## 2017-02-03 ENCOUNTER — Ambulatory Visit: Payer: Self-pay | Admitting: Obstetrics & Gynecology

## 2017-02-03 ENCOUNTER — Encounter: Payer: Self-pay | Admitting: *Deleted

## 2017-02-03 NOTE — Progress Notes (Signed)
Brittney Holmes did not keep her scheduled appointment . Per discussion with Dr. Macon LargeAnyanwu does not need to be called, may reschedule if she calls.

## 2017-03-20 ENCOUNTER — Ambulatory Visit: Payer: Self-pay | Admitting: Student in an Organized Health Care Education/Training Program

## 2017-04-28 ENCOUNTER — Ambulatory Visit: Payer: Self-pay | Admitting: Student in an Organized Health Care Education/Training Program

## 2017-05-10 ENCOUNTER — Ambulatory Visit: Payer: Medicaid Other | Admitting: Student in an Organized Health Care Education/Training Program

## 2017-05-31 ENCOUNTER — Ambulatory Visit (INDEPENDENT_AMBULATORY_CARE_PROVIDER_SITE_OTHER): Payer: Medicaid Other | Admitting: Family Medicine

## 2017-05-31 ENCOUNTER — Other Ambulatory Visit: Payer: Self-pay

## 2017-05-31 ENCOUNTER — Other Ambulatory Visit (HOSPITAL_COMMUNITY)
Admission: RE | Admit: 2017-05-31 | Discharge: 2017-05-31 | Disposition: A | Discharge status: Home / Self Care | Payer: Medicaid Other | Source: Ambulatory Visit | Attending: Family Medicine | Admitting: Family Medicine

## 2017-05-31 ENCOUNTER — Encounter: Payer: Self-pay | Admitting: Family Medicine

## 2017-05-31 VITALS — BP 110/70 | HR 92 | Temp 98.2°F | Ht 63.0 in | Wt 89.0 lb

## 2017-05-31 DIAGNOSIS — N898 Other specified noninflammatory disorders of vagina: Secondary | ICD-10-CM

## 2017-05-31 DIAGNOSIS — R635 Abnormal weight gain: Secondary | ICD-10-CM

## 2017-05-31 DIAGNOSIS — Z3009 Encounter for other general counseling and advice on contraception: Secondary | ICD-10-CM

## 2017-05-31 DIAGNOSIS — Z3202 Encounter for pregnancy test, result negative: Secondary | ICD-10-CM

## 2017-05-31 LAB — POCT WET PREP (WET MOUNT)
Clue Cells Wet Prep Whiff POC: NEGATIVE
Trichomonas Wet Prep HPF POC: ABSENT

## 2017-05-31 LAB — POCT URINE PREGNANCY: PREG TEST UR: NEGATIVE

## 2017-05-31 NOTE — Patient Instructions (Signed)
It was a pleasure to see you today! Thank you for choosing [facility] for your primary care. Brittney Holmes was seen for STD testing, vaginal odor.   Our plans for today were:  I will call with any abnormal results.   We will check your thyroid function today and I will call if that is abnormal too.   Make an appt with me or Dr. Mosetta PuttFeng for the IUD placement.   Best,  Dr. Chanetta Marshallimberlake

## 2017-05-31 NOTE — Progress Notes (Addendum)
   CC: vaginal odor, desires STD testing  HPI  Vaginal odor - present x 1 week, no pain or itching.   STD testing - new partner, no known exposure, thinks hx of gonorhhea in the past. No lesions. Wants HIV and RPR as well. No fevers.   Weight - has been trying for "her whole life" to gain weight. Can;t recall thyroid workup. Has been using protein shakes without change. Reports always colder than others, no SOB with exertion or palpitations.  No birth control, would like this. Had undesired spotting with nexplanon in the past.  ROS: Denies CP, SOB, abdominal pain, dysuria, changes in BMs.   CC, SH/smoking status, and VS noted  Objective: BP 110/70   Pulse 92   Temp 98.2 F (36.8 C) (Oral)   Ht 5\' 3"  (1.6 m)   Wt 89 lb (40.4 kg)   SpO2 98%   BMI 15.77 kg/m  Gen: NAD, alert, cooperative, and pleasant thin female.  HEENT: NCAT, EOMI, PERRL CV: RRR, no murmur Resp: CTAB, no wheezes, non-labored Abd: SNTND, BS present, no guarding or organomegaly GU: normal external female genitalia without lesion, no CMT, no abnormal cervical discharge, thin white vaginal discharge in vault.  Neuro: Alert and oriented, Speech clear, No gross deficits  Assessment and plan:  Vaginal odor - wet prep today, patient elected to call with results.   STD testing-  g/c swabs obtained, HIV and RPR ordered.   Weight - order TSH with labs, follow up with PCP for additional workup if normal. Checked CBC for possible anemia.   Birth control - thinks she wants IUD, recommended that she schedule this with me or Dr. Mosetta PuttFeng soon. Offered depo today, she declines.   Orders Placed This Encounter  Procedures  . HIV antibody  . RPR  . TSH  . CBC  . POCT urine pregnancy  . POCT Wet Prep [facility] [place]    No orders of the defined types were placed in this encounter.    Loni MuseKate Gabriella Guile, MD, PGY2 06/02/2017 10:02 AM

## 2017-06-01 ENCOUNTER — Telehealth: Payer: Self-pay | Admitting: Family Medicine

## 2017-06-01 LAB — CBC
HEMATOCRIT: 32.3 % — AB (ref 34.0–46.6)
HEMOGLOBIN: 10.2 g/dL — AB (ref 11.1–15.9)
MCH: 25.6 pg — ABNORMAL LOW (ref 26.6–33.0)
MCHC: 31.6 g/dL (ref 31.5–35.7)
MCV: 81 fL (ref 79–97)
Platelets: 404 10*3/uL — ABNORMAL HIGH (ref 150–379)
RBC: 3.98 x10E6/uL (ref 3.77–5.28)
RDW: 13.3 % (ref 12.3–15.4)
WBC: 7.7 10*3/uL (ref 3.4–10.8)

## 2017-06-01 LAB — RPR: RPR: NONREACTIVE

## 2017-06-01 LAB — CERVICOVAGINAL ANCILLARY ONLY
Chlamydia: NEGATIVE
Neisseria Gonorrhea: NEGATIVE

## 2017-06-01 LAB — TSH: TSH: 0.532 u[IU]/mL (ref 0.450–4.500)

## 2017-06-01 LAB — HIV ANTIBODY (ROUTINE TESTING W REFLEX): HIV Screen 4th Generation wRfx: NONREACTIVE

## 2017-06-01 NOTE — Telephone Encounter (Signed)
I spoke with Pt and discussed the results left in the notes.  -bs

## 2017-06-01 NOTE — Telephone Encounter (Signed)
Called both cell phone and home phone, left generic VM to return call to clinic. If she returns call, all of her STD testing was negative, and the wet prep didn't show any cause for vaginal odor. Her thyroid test was normal, if she asks about this. I would recommend that she follow up with Dr. Mosetta PuttFeng with her concerns for not gaining weight.   Brittney MuseKate Leylani Duley, MD

## 2017-06-30 ENCOUNTER — Ambulatory Visit: Payer: Self-pay | Admitting: Student in an Organized Health Care Education/Training Program

## 2018-01-23 ENCOUNTER — Ambulatory Visit: Payer: Self-pay

## 2018-01-31 ENCOUNTER — Ambulatory Visit: Payer: Medicaid Other | Admitting: Family Medicine

## 2018-02-05 ENCOUNTER — Telehealth: Payer: Self-pay | Admitting: Student in an Organized Health Care Education/Training Program

## 2018-02-05 NOTE — Telephone Encounter (Signed)
Received message from Dr. Lum BabeEniola that this patient has no showed numerous clinic visits. Per clinic policy I have to call her and inform her of our no-show dismissal policy. Called and spoke with the patient who indicated that she was unaware she had to call >24h prior to an appointment or it counts as a no show.  She will try to call further ahead of time in the future if she needs to cancel an appt.

## 2018-09-06 ENCOUNTER — Other Ambulatory Visit: Payer: Self-pay

## 2018-09-06 ENCOUNTER — Ambulatory Visit (INDEPENDENT_AMBULATORY_CARE_PROVIDER_SITE_OTHER): Payer: Medicaid Other | Admitting: Family Medicine

## 2018-09-06 ENCOUNTER — Other Ambulatory Visit (HOSPITAL_COMMUNITY)
Admission: RE | Admit: 2018-09-06 | Discharge: 2018-09-06 | Disposition: A | Discharge status: Home / Self Care | Payer: Medicaid Other | Source: Ambulatory Visit | Attending: Family Medicine | Admitting: Family Medicine

## 2018-09-06 VITALS — BP 100/60 | HR 66 | Temp 98.3°F | Wt 90.0 lb

## 2018-09-06 DIAGNOSIS — O0990 Supervision of high risk pregnancy, unspecified, unspecified trimester: Secondary | ICD-10-CM | POA: Diagnosis not present

## 2018-09-06 DIAGNOSIS — Z7251 High risk heterosexual behavior: Secondary | ICD-10-CM | POA: Insufficient documentation

## 2018-09-06 DIAGNOSIS — B373 Candidiasis of vulva and vagina: Secondary | ICD-10-CM

## 2018-09-06 DIAGNOSIS — R109 Unspecified abdominal pain: Secondary | ICD-10-CM | POA: Insufficient documentation

## 2018-09-06 DIAGNOSIS — R1033 Periumbilical pain: Secondary | ICD-10-CM

## 2018-09-06 DIAGNOSIS — Z3201 Encounter for pregnancy test, result positive: Secondary | ICD-10-CM | POA: Diagnosis not present

## 2018-09-06 DIAGNOSIS — K5900 Constipation, unspecified: Secondary | ICD-10-CM | POA: Diagnosis not present

## 2018-09-06 DIAGNOSIS — B3731 Acute candidiasis of vulva and vagina: Secondary | ICD-10-CM

## 2018-09-06 LAB — POCT WET PREP (WET MOUNT)
Clue Cells Wet Prep Whiff POC: NEGATIVE
Trichomonas Wet Prep HPF POC: ABSENT

## 2018-09-06 LAB — POCT URINE PREGNANCY: Preg Test, Ur: POSITIVE — AB

## 2018-09-06 MED ORDER — FLUCONAZOLE 150 MG PO TABS: 150.0000 mg | ORAL_TABLET | Freq: Once | ORAL | 0 refills | Status: DC

## 2018-09-06 MED ORDER — TERCONAZOLE 80 MG VA SUPP
80.0000 mg | Freq: Every day | VAGINAL | 0 refills | Status: DC
Start: 2018-09-06 — End: 2019-01-16

## 2018-09-06 MED ORDER — PRENATAL VITAMIN PLUS LOW IRON 27-1 MG PO TABS: 1.0000 | ORAL_TABLET | Freq: Every day | ORAL | 10 refills | Status: DC

## 2018-09-06 NOTE — Progress Notes (Signed)
   Established Patient - Clinic Visit Subjective  Subjective  Patient ID: MRN [id number]  Date of birth: 1995-07-04   PCP: Stark Klein, MD  CC: Amenorrhea  Brittney Holmes is a 23 y.o. female with past medical history significant for  Ectopic cornual pregnancy c/b hemoperitoneum due to rupture,  s/p ex lap, right wedge resection and salingectomy who presents today with the following problems:  HPI # Vaginal Discharge & lower abdominal pain  Patient reports vaginal discharge that started 2 days ago.  She describes the discharge as white and creamy.  She reports that the discharge will leak into her pants at times.  She reports that it is a little bit greenish with no smell.  She denies any vaginal itching, recent new sexual partner (has been with current sexual partner for 1 year).  #Abdominal pain Patient reports an intermittent pain in her lower abdomen just under her umbilicus that feels like "gas pain".  However, patient denies any gas. She does report constipation and significant straining with her BM yesterday. Patient denies fever, diarrhea, n/v, vaginal bleeding, severe pain.  HISTORY Medications, allergies, medical history, family history and social history were reviewed. Pertinent findings included in HPI.   Social Hx: Jolyn reports that she has never smoked. She has never used smokeless tobacco. She reports that she does not drink alcohol or use drugs. ROS: See HPI    Objective   Objective  Physical Exam:  BP 100/60   Pulse 66   Temp 98.3 F (36.8 C) (Oral)   Wt 90 lb (40.8 kg)   LMP 07/28/2018   BMI 15.94 kg/m  General: NAD, non-toxic, well-appearing, sitting comfortably in chair   Abdomen: + BS. NT, ND, soft to palpation.  GU: External vulva and vagina nonerythematous, without any obvious lesions or rash.  Creamy white discharge appreciated.  Normal ruggae of vaginal walls.  Cervical os is closed, cervix is non erythematous and non-friable.  There is no cervical  motion tenderness.  Pertinent Labs & Imaging:  09/06/18 in office POCT ultrasound, + IUP  Positive UPT  Wet prep: few yeast, negative trich, no clue cells, negative whiff   Assessment  Assessment & Plan  At high risk for complications of intrauterine pregnancy (IUP) Referral to MFM  Constipation Increase water intake and start colace as directed.   Abdominal pain Patient's abdominal pain most likely due to constipation.  Though, as patient is pregnant with previous ectopic pregnancy, patient given return precautions and red flags to present to MAU or nearest emergency department.  Positive urine pregnancy test UPT positive.  Point-of-care ultrasound performed in clinic with fetal heart fluttering appreciated.  Will confirm with vaginal ultrasound which has been scheduled for 09/21/2018 at 10 AM at [facility].  Referral to MFM for high risk pregnancy  Prenatal vitamins sent to pharmacy  Information about prenatal care provided  Yeast infection of the vagina Yeast on wet prep and is most likely cause of patient's vaginal discharge.  Initially sent fluconazole to the pharmacy, however, with positive pregnancy test, canceled fluconazole with pharmacy and confirmed changing of medications with patient over the phone.  Terconazole suppository x3 days  Return if no improvement   There are no preventive care reminders to display for this patient.  Follow-up:  Future Appointments  Date Time Provider [place]  09/21/2018 10:00 AM [facility] [place] 1 [facility]      Wilber Oliphant, M.D.  PGY-2  Family Medicine  09/06/2018 3:50 PM

## 2018-09-06 NOTE — Assessment & Plan Note (Signed)
Yeast on wet prep and is most likely cause of patient's vaginal discharge.  Initially sent fluconazole to the pharmacy, however, with positive pregnancy test, canceled fluconazole with pharmacy and confirmed changing of medications with patient over the phone.  Terconazole suppository x3 days  Return if no improvement

## 2018-09-06 NOTE — Patient Instructions (Addendum)
Dear Brittney Holmes,   It was good to see you! Thank you for taking your time to come in to be seen. Today, we discussed the following:   Congratulations!    Sent prenatal vitamins to the pharmacy  Information on prenatal care is below  Your ultrasound is scheduled for 09/21/18 at 10AM  Referral to Maternal Fetal Medicine for high risk pregnancy  Abdominal pain   Likely due to constipation, but, because you are also pregnant, please be very mindful of the pain that you're having. If you have any bleeding or severe abdominal pain, please go to the MAU or closest emergency department.   You can try "Colace" a stool softener to help with constipation and drink plenty of water  Vaginal discharge  Testing was positive for yeast infection   Sent diflucan to the pharmacy.   For any pregnancy-related emergencies, please go to the Maternity Admissions Unit in the [facility]. You will use hospital Entrance C.         Be well,   Genia Hotterachel Darwyn Ponzo, M.D   [facility] [phone]  *Sign up for MyChart for instant access to your health profile, labs, orders, upcoming appointments or to contact your provider with questions*  Prenatal Care Prenatal care is health care during pregnancy. It helps you and your unborn baby (fetus) stay as healthy as possible. Prenatal care may be provided by a midwife, a family practice health care provider, or a childbirth and pregnancy specialist (obstetrician). How does this affect me? During pregnancy, you will be closely monitored for any new conditions that might develop. To lower your risk of pregnancy complications, you and your health care provider will talk about any underlying conditions you have. How does this affect my baby? Early and consistent prenatal care increases the chance that your baby will be healthy during pregnancy. Prenatal care lowers the risk that your baby will be:  Born early  (prematurely).  Smaller than expected at birth (small for gestational age). What can I expect at the first prenatal care visit? Your first prenatal care visit will likely be the longest. You should schedule your first prenatal care visit as soon as you know that you are pregnant. Your first visit is a good time to talk about any questions or concerns you have about pregnancy. At your visit, you and your health care provider will talk about:  Your medical history, including: ? Any past pregnancies. ? Your family's medical history. ? The baby's father's medical history. ? Any long-term (chronic) health conditions you have and how you manage them. ? Any surgeries or procedures you have had. ? Any current over-the-counter or prescription medicines, herbs, or supplements you are taking.  Other factors that could pose a risk to your baby, including:  Your home setting and your stress levels, including: ? Exposure to abuse or violence. ? Household financial strain. ? Mental health conditions you have.  Your daily health habits, including diet and exercise. Your health care provider will also:  Measure your weight, height, and blood pressure.  Do a physical exam, including a pelvic and breast exam.  Perform blood tests and urine tests to check for: ? Urinary tract infection. ? Sexually transmitted infections (STIs). ? Low iron levels in your blood (anemia). ? Blood type and certain proteins on red blood cells (Rh antibodies). ? Infections and immunity to viruses, such as hepatitis B and rubella. ? HIV (human immunodeficiency virus).  Do an  ultrasound to confirm your baby's growth and development and to help predict your estimated due date (EDD). This ultrasound is done with a probe that is inserted into the vagina (transvaginal ultrasound).  Discuss your options for genetic screening.  Give you information about how to keep yourself and your baby healthy, including: ? Nutrition and  taking vitamins. ? Physical activity. ? How to manage pregnancy symptoms such as nausea and vomiting (morning sickness). ? Infections and substances that may be harmful to your baby and how to avoid them. ? Food safety. ? Dental care. ? Working. ? Travel. ? Warning signs to watch for and when to call your health care provider. How often will I have prenatal care visits? After your first prenatal care visit, you will have regular visits throughout your pregnancy. The visit schedule is often as follows:  Up to week 28 of pregnancy: once every 4 weeks.  28-36 weeks: once every 2 weeks.  After 36 weeks: every week until delivery. Some women may have visits more or less often depending on any underlying health conditions and the health of the baby. Keep all follow-up and prenatal care visits as told by your health care provider. This is important. What happens during routine prenatal care visits? Your health care provider will:  Measure your weight and blood pressure.  Check for fetal heart sounds.  Measure the height of your uterus in your abdomen (fundal height). This may be measured starting around week 20 of pregnancy.  Check the position of your baby inside your uterus.  Ask questions about your diet, sleeping patterns, and whether you can feel the baby move.  Review warning signs to watch for and signs of labor.  Ask about any pregnancy symptoms you are having and how you are dealing with them. Symptoms may include: ? Headaches. ? Nausea and vomiting. ? Vaginal discharge. ? Swelling. ? Fatigue. ? Constipation. ? Any discomfort, including back or pelvic pain. Make a list of questions to ask your health care provider at your routine visits. What tests might I have during prenatal care visits? You may have blood, urine, and imaging tests throughout your pregnancy, such as:  Urine tests to check for glucose, protein, or signs of infection.  Glucose tests to check for a  form of diabetes that can develop during pregnancy (gestational diabetes mellitus). This is usually done around week 24 of pregnancy.  An ultrasound to check your baby's growth and development and to check for birth defects. This is usually done around week 20 of pregnancy.  A test to check for group B strep (GBS) infection. This is usually done around week 36 of pregnancy.  Genetic testing. This may include blood or imaging tests, such as an ultrasound. Some genetic tests are done during the first trimester and some are done during the second trimester. What else can I expect during prenatal care visits? Your health care provider may recommend getting certain vaccines during pregnancy. These may include:  A yearly flu shot (annual influenza vaccine). This is especially important if you will be pregnant during flu season.  Tdap (tetanus, diphtheria, pertussis) vaccine. Getting this vaccine during pregnancy can protect your baby from whooping cough (pertussis) after birth. This vaccine may be recommended between weeks 27 and 36 of pregnancy. Later in your pregnancy, your health care provider may give you information about:  Childbirth and breastfeeding classes.  Choosing a health care provider for your baby.  Umbilical cord banking.  Breastfeeding.  Birth control after  your baby is born.  The hospital labor and delivery unit and how to tour it.  Registering at the hospital before you go into labor. Where to find more information  Office on Women's Health: TravelLesson.cawomenshealth.gov  American Pregnancy Association: americanpregnancy.org  March of Dimes: marchofdimes.org Summary  Prenatal care helps you and your baby stay as healthy as possible during pregnancy.  Your first prenatal care visit will most likely be the longest.  You will have visits and tests throughout your pregnancy to monitor your health and your baby's health.  Bring a list of questions to your visits to ask your  health care provider.  Make sure to keep all follow-up and prenatal care visits with your health care provider. This information is not intended to replace advice given to you by your health care provider. Make sure you discuss any questions you have with your health care provider. Document Released: 02/17/2003 Document Revised: 06/06/2018 Document Reviewed: 02/13/2017 Elsevier Patient Education  2020 [employer]  Pregnancy and the Partner's Role  You have an important role during your partner's pregnancy, labor, and delivery. To be helpful and supportive during this time, you should know what to expect. What are the stages of pregnancy? Pregnancy usually lasts for about 40 weeks. The pregnancy period is divided into 3 trimesters. First trimester (0-13 weeks) During this trimester, your partner may:  Feel tired.  Have painful breasts.  Feel nauseous and vomit.  Urinate more often.  Have mood changes. All of these changes are normal. Try to be helpful, supportive, and understanding. This may include helping with household chores and spending more time with each other. Second trimester (14-28 weeks) During this trimester:  Your partner may feel better and more energetic. This is the best time to be more active together.  You will be able to see her belly showing the pregnancy.  You may be able to feel the baby kick.  Your partner may have soreness or aching in her back as she gains weight. You can help by carrying heavy things and by rubbing her back when she is feeling sore. Third trimester (29-40 weeks) During the final weeks, your partner may:  Become more uncomfortable as the baby grows.  Have a hard time doing everyday activities, and her balance may be off.  Have a hard time bending over.  Get tired easily.  Have difficulty sleeping. At this time, the birth of your baby is close. You and your partner may have concerns or questions. This is normal. Talk with each  other and with your health care provider. Continue to help your partner with household chores, encourage her to rest, and rub her sore back and legs, if this helps her. How to support your partner during pregnancy There are many things you can do to support your partner during her pregnancy. Emotional support During your partner's pregnancy, you and your partner may:  Feel happy, excited, or proud.  Have concerns about finances or other new responsibilities.  Feel overwhelmed or scared.  Worry about changes in your relationship with your partner. These feelings are normal. Talk about them openly with your partner, trusted family members, other new parents, or your health care provider. Find ways to relax and manage stress together, such as taking a walk or listening to music. Prenatal care Attend prenatal care visits with your partner. This is a good time for you to get to know your health care provider, follow the pregnancy, and ask questions. You may have prenatal  visits:  Once a month for the first 6 months.  Once every 2 weeks from 6-8 months.  Once a week during the last month.  You may have more prenatal visits if your health care provider believes that they are needed. Sex and intimacy It's important to stay connected with your partner during pregnancy, since your relationship may experience changes once baby arrives. Sexual intercourse is safe unless there is a problem with the pregnancy and your health care provider advises you not to have sex. Your partner may not want to have sex during certain times due to pregnancy-related physical and emotional changes. To continue to be intimate:  Discuss your feelings and desires.  Try different positions to make sex more comfortable.  Find other ways to be intimate, such as massage.  Talk with your health care provider about any questions that you may have about sexual intercourse during pregnancy. Childbirth classes Attend  childbirth classes with your partner if you are able. Classes help you and your partner bond by preparing for labor and delivery. Classes teach you:  What happens during labor and delivery.  How to emotionally and physically support your partner.  Relaxation techniques.  How to work through your partner's labor pains.  How to focus during labor and delivery. Follow these instructions at home:  Talk with your partner about her preferences for labor and delivery. If you plan to be present during labor and delivery, talk about: ? Her pain management preferences. You can help manage pain with massage and positioning your partner during labor. You can also help advocate for her preferences. ? How the baby will be delivered. Depending on your partner's health and pregnancy history, she may be able to attempt a vaginal delivery or may need to schedule a cesarean delivery, or C-section. ? If you would like to help with the delivery or cut your baby's umbilical cord.  Find ways to support your partner's health during pregnancy: ? Eat a healthy, well-balanced diet with your partner. ? Exercise with your partner. Exercising regularly during pregnancy can boost your partner's mood, reduce pain and swelling, and may even help prepare your partner to give birth. ? Make sure you are up to date on all recommended vaccines like the annual flu shot. ? Do not smoke or use e-cigarettes around your partner. When your partner breathes in smoke, the harmful substances that are inhaled can pass to your baby through the placenta. Summary  To be helpful and supportive during your partner's pregnancy, you can provide emotional support, find ways to be intimate with your partner, and attend prenatal care visits and childbirth classes.  Attending prenatal care visits with your partner helps you get to know your health care provider, follow the pregnancy, and ask questions.  It is important to talk with your partner  and your health care provider if you are worried or have any questions.  Talk with your partner about her preferences for labor and delivery. Be prepared to be an advocate for her. This information is not intended to replace advice given to you by your health care provider. Make sure you discuss any questions you have with your health care provider. Document Released: 08/03/2007 Document Revised: 06/08/2018 Document Reviewed: 02/27/2017 Elsevier Patient Education  2020 [employer].

## 2018-09-06 NOTE — Assessment & Plan Note (Signed)
Patient's abdominal pain most likely due to constipation.  Though, as patient is pregnant with previous ectopic pregnancy, patient given return precautions and red flags to present to MAU or nearest emergency department.

## 2018-09-06 NOTE — Assessment & Plan Note (Signed)
Referral to MFM.

## 2018-09-06 NOTE — Assessment & Plan Note (Signed)
Increase water intake and start colace as directed.

## 2018-09-06 NOTE — Assessment & Plan Note (Signed)
UPT positive.  Point-of-care ultrasound performed in clinic with fetal heart fluttering appreciated.  Will confirm with vaginal ultrasound which has been scheduled for 09/21/2018 at 10 AM at [facility].  Referral to MFM for high risk pregnancy  Prenatal vitamins sent to pharmacy  Information about prenatal care provided

## 2018-09-07 LAB — CERVICOVAGINAL ANCILLARY ONLY
Chlamydia: NEGATIVE
Neisseria Gonorrhea: NEGATIVE

## 2018-09-18 ENCOUNTER — Telehealth: Payer: Self-pay | Admitting: *Deleted

## 2018-09-18 NOTE — Telephone Encounter (Signed)
Pt LM on nurse line that her meds were never sent to the pharmacy.   Upon review they were sent, attempted to call patient but no answer and no machine. Christen Bame, CMA

## 2018-09-21 ENCOUNTER — Ambulatory Visit (HOSPITAL_COMMUNITY)
Admission: RE | Admit: 2018-09-21 | Discharge: 2018-09-21 | Disposition: A | Discharge status: Home / Self Care | Payer: Medicaid Other | Source: Ambulatory Visit | Attending: Family Medicine | Admitting: Family Medicine

## 2018-09-21 ENCOUNTER — Other Ambulatory Visit: Payer: Self-pay

## 2018-09-21 DIAGNOSIS — Z3491 Encounter for supervision of normal pregnancy, unspecified, first trimester: Secondary | ICD-10-CM | POA: Diagnosis not present

## 2018-09-21 DIAGNOSIS — Z3201 Encounter for pregnancy test, result positive: Secondary | ICD-10-CM | POA: Diagnosis not present

## 2018-09-21 DIAGNOSIS — Z3A08 8 weeks gestation of pregnancy: Secondary | ICD-10-CM | POA: Diagnosis not present

## 2018-10-02 ENCOUNTER — Telehealth: Payer: Self-pay

## 2018-10-02 ENCOUNTER — Other Ambulatory Visit: Payer: Self-pay | Admitting: Family Medicine

## 2018-10-02 DIAGNOSIS — O0990 Supervision of high risk pregnancy, unspecified, unspecified trimester: Secondary | ICD-10-CM

## 2018-10-02 NOTE — Telephone Encounter (Signed)
Patient calls nurse line stating she needs a referral put in for MFM high risk. Patient stated she has been trying to set up her apt, however she needs a referral. Please advise.

## 2018-10-30 ENCOUNTER — Ambulatory Visit (HOSPITAL_COMMUNITY): Payer: Medicaid Other

## 2018-10-30 ENCOUNTER — Encounter (HOSPITAL_COMMUNITY): Payer: Self-pay

## 2018-10-31 ENCOUNTER — Ambulatory Visit (HOSPITAL_COMMUNITY)
Admission: RE | Admit: 2018-10-31 | Discharge: 2018-10-31 | Disposition: A | Discharge status: Home / Self Care | Payer: Medicaid Other | Source: Ambulatory Visit | Attending: Obstetrics | Admitting: Obstetrics

## 2018-10-31 ENCOUNTER — Other Ambulatory Visit: Payer: Self-pay

## 2018-10-31 ENCOUNTER — Ambulatory Visit (HOSPITAL_BASED_OUTPATIENT_CLINIC_OR_DEPARTMENT_OTHER): Payer: Medicaid Other | Admitting: Obstetrics

## 2018-10-31 ENCOUNTER — Other Ambulatory Visit (HOSPITAL_COMMUNITY): Payer: Self-pay | Admitting: Obstetrics

## 2018-10-31 ENCOUNTER — Ambulatory Visit (HOSPITAL_COMMUNITY): Payer: Medicaid Other | Admitting: *Deleted

## 2018-10-31 ENCOUNTER — Encounter (HOSPITAL_COMMUNITY): Payer: Self-pay

## 2018-10-31 ENCOUNTER — Ambulatory Visit (HOSPITAL_COMMUNITY): Payer: Self-pay | Admitting: Genetic Counselor

## 2018-10-31 ENCOUNTER — Ambulatory Visit (HOSPITAL_BASED_OUTPATIENT_CLINIC_OR_DEPARTMENT_OTHER): Payer: Medicaid Other

## 2018-10-31 ENCOUNTER — Ambulatory Visit (HOSPITAL_COMMUNITY): Payer: Medicaid Other

## 2018-10-31 ENCOUNTER — Other Ambulatory Visit (HOSPITAL_COMMUNITY): Payer: Self-pay | Admitting: *Deleted

## 2018-10-31 VITALS — BP 106/69 | HR 102 | Temp 98.7°F | Wt 90.2 lb

## 2018-10-31 DIAGNOSIS — O0912 Supervision of pregnancy with history of ectopic or molar pregnancy, second trimester: Secondary | ICD-10-CM | POA: Insufficient documentation

## 2018-10-31 DIAGNOSIS — Z3A15 15 weeks gestation of pregnancy: Secondary | ICD-10-CM

## 2018-10-31 DIAGNOSIS — O3429 Maternal care due to uterine scar from other previous surgery: Secondary | ICD-10-CM

## 2018-10-31 DIAGNOSIS — Z31430 Encounter of female for testing for genetic disease carrier status for procreative management: Secondary | ICD-10-CM | POA: Diagnosis present

## 2018-10-31 DIAGNOSIS — Z36 Encounter for antenatal screening for chromosomal anomalies: Secondary | ICD-10-CM | POA: Diagnosis not present

## 2018-10-31 DIAGNOSIS — Z3682 Encounter for antenatal screening for nuchal translucency: Secondary | ICD-10-CM | POA: Insufficient documentation

## 2018-10-31 DIAGNOSIS — Z3A14 14 weeks gestation of pregnancy: Secondary | ICD-10-CM | POA: Diagnosis not present

## 2018-10-31 DIAGNOSIS — O10912 Unspecified pre-existing hypertension complicating pregnancy, second trimester: Secondary | ICD-10-CM | POA: Insufficient documentation

## 2018-10-31 DIAGNOSIS — Z363 Encounter for antenatal screening for malformations: Secondary | ICD-10-CM | POA: Diagnosis not present

## 2018-10-31 DIAGNOSIS — O09292 Supervision of pregnancy with other poor reproductive or obstetric history, second trimester: Secondary | ICD-10-CM | POA: Insufficient documentation

## 2018-10-31 DIAGNOSIS — O10919 Unspecified pre-existing hypertension complicating pregnancy, unspecified trimester: Secondary | ICD-10-CM

## 2018-10-31 NOTE — Progress Notes (Signed)
10/31/2018  Brittney Holmes 1995/04/23 MRN: [id number] DOV: 10/31/2018  Brittney Holmes [facility] for a genetics consultation regarding screening for chromosomal aneuploidies. Brittney Holmes came to her appointment alone due to COVID-19 visitor restrictions.   Indication for genetic counseling - General pregnancy screening  Prenatal history  Brittney Holmes is a [id number], 23 y.o. year old female. Her current pregnancy has completed 6216w3d (Estimated Date of Delivery: 04/28/19).  Brittney Holmes denied exposure to environmental toxins or chemical agents. She denied the use of alcohol, tobacco or street drugs. She denied significant viral illnesses, fevers, and bleeding during the course of her pregnancy. Her medical and surgical histories were noncontributory.  Family History  A three generation pedigree was drafted and reviewed. The family history is remarkable for the following:  - Brittney Holmes's maternal aunt has a son with "learning problems". This rest of this individual's history is noncontributory, and no one else in the family has intellectual disability or autism. We reviewed that learning disabilities can be isolated, multifactorial, or part of a genetic syndrome. Recurrence risk for first-degree relatives of individuals with idiopathic intellectual disability is estimated to be between 3-14%. Given that Brittney Holmes's cousin is a fourth-degree relative to her fetus, the risk of recurrence is likely not greatly elevated above the general population risk of ~1%.  The remaining family histories were reviewed and found to be noncontributory for birth defects, intellectual disability, recurrent pregnancy loss, and known genetic conditions. Brittney Holmes had limited information about her partner's family history; thus, risk assessment was limited.  The patient's ethnicity is African American. The father of the pregnancy's ethnicity is African American. Ashkenazi Jewish ancestry and  consanguinity were denied. Pedigree will be scanned under Media.  Discussion  Brittney Holmes was referred to genetic counseling as she was interested in discussing noninvasive prenatal screening (NIPS) for the current pregnancy. We discussed that NIPS analyzes cell free fetal DNA found in the maternal blood circulation during pregnancy. This test is not diagnostic for chromosome conditions, but can provide information regarding the presence or absence of extra fetal DNA for chromosomes 13, 18 and 21. Thus, it would not identify or rule out all fetal aneuploidy. The reported detection rate is 91-99% for trisomies 21, 18, 13, and sex chromosome aneuploidies. Brittney Holmes indicated that she is interested in undergoing NIPS.  Per ACOG recommendation, carrier screening for hemoglobinopathies, cystic fibrosis (CF) and spinal muscular atrophy (SMA) was discussed including information about the conditions, rationale for testing, autosomal recessive inheritance, and the option of prenatal diagnosis. The patient was informed that select hemoglobinopathies and CF are included on [name]'s newborn screen, and that SMA can be added on to a baby's newborn screen via the Early Check research study. Based on ethnicity alone, Brittney Holmes's risk to be a carrier of CF is 1 in 4861. Her risk to be a carrier of SMA is 1 in 4566. Her risk to be a carrier for hemoglobinopathies is 1 in 8. Brittney Holmes thought her partner may be a carrier for sickle cell disease. For this reason, she was interested in pursuing carrier screening for CF, SMA, and hemoglobinopathies.  A limited ultrasound was performed today prior to our visit. The ultrasound report will be sent under separate cover. There were no visualized fetal markers suggestive of aneuploidy.   Brittney Holmes was also counseled regarding the option of diagnostic testing via chorionic villus sampling (CVS) or amniocentesis . We discussed the technical aspects of each  procedure and quoted up to  a 1 in 500 (0.2%) risk for spontaneous pregnancy loss or other adverse pregnancy outcomes as a result of either procedure. Cultured cells from either a placental or amniotic fluid sample allow for the visualization of a fetal karyotype, which can detect >99% of chromosomal aberrations. Chromosomal microarray can also be performed to identify smaller deletions or duplications of fetal chromosomal material. After careful consideration, Brittney Holmes declined diagnostic testing at this time. She understands that diagnostic testing is available at any point through the end of pregnancy and that she may opt to undergo the procedure at a later date should she change her mind.  Brittney Holmes had her blood drawn for MaterniT21 NIPS and carrier screening today. Results from NIPS will take 3-5 days to be returned, and results from carrier screening will take 9-14 days. I will call her when results become available.  I counseled Brittney Holmes regarding the above risks and available options. The approximate face-to-face time with the genetic counselor was 20 minutes.  In summary:  Discussed noninvasive prenatal screening (NIPS) for fetal aneuploidies  Opted to undergo NIPS. We will follow results  Discussed carrier screening for cystic fibrosis, spinal muscular atrophy, and hemoglobinopathies  Opted to undergo carrier screening. We will follow results  Reviewed results of ultrasound  No fetal markers seen  Offered additional testing and screening  Declined CVS  Reviewed family history concerns   [name], MS Genetic Counselor

## 2018-11-01 ENCOUNTER — Other Ambulatory Visit (HOSPITAL_COMMUNITY): Payer: Self-pay | Admitting: *Deleted

## 2018-11-01 DIAGNOSIS — O09292 Supervision of pregnancy with other poor reproductive or obstetric history, second trimester: Secondary | ICD-10-CM

## 2018-11-06 ENCOUNTER — Telehealth (HOSPITAL_COMMUNITY): Payer: Self-pay | Admitting: Genetic Counselor

## 2018-11-06 ENCOUNTER — Other Ambulatory Visit (HOSPITAL_COMMUNITY): Payer: Self-pay | Admitting: Obstetrics

## 2018-11-06 NOTE — Telephone Encounter (Signed)
I called Brittney Holmes to discuss her negative noninvasive prenatal screening (NIPS)/cell free DNA (cfDNA) testing result. Specifically, [name]  had MaterniT21 testing through [facility]. These negative results demonstrated an expected representation of chromosome 21, 18, and 13 material, greatly reducing the likelihood of trisomies 21, 13, or 18 for the pregnancy. [name] requested to know about the expected fetal sex, which is female.   NIPS analyzes placental (fetal) DNA in maternal circulation. NIPS is considered to be highly specific and sensitive, but is not considered to be a diagnostic test. We reviewed that this testing identifies the majority of pregnancies with trisomies 66, 34, and 78, as well as the sex of the baby. She asked how often the predicted fetal sex is accurate, as she has heard of cases where fetal sex was discrepant between NIPS and ultrasound findings. We discussed that [place] quotes a 99.4% accuracy rate for detection of the Y chromosome; thus, it would be very unlikely that there would be discordant results between NIPS and ultrasound. Diagnostic testing via amniocentesis is available should she be interested in confirming this NIPS result.   [name] also was interested in learning about options for paternity testing. We discussed that paternity testing can cost anywhere from $1000-$3000 when performed prenatally and that postnatal testing is much less expensive. [name] was counseled that both [facility] and [facility] offer postnatal paternity testing. Home paternity testing starts at $199 and legal paternity testing starts at $499 through [facility]. [name] was satisfied with this information.  Results from carrier screening for cystic fibrosis, spinal muscular atrophy, and hemoglobinopathies are still pending. I informed [name] that I would call her when those results become available. [name] confirmed that she had no questions at this  time.  [name], MS Genetic Counselor

## 2018-11-17 LAB — MATERNIT 21 PLUS CORE, BLOOD
Fetal Fraction: 7
Result (T21): NEGATIVE
Trisomy 13 (Patau syndrome): NEGATIVE
Trisomy 18 (Edwards syndrome): NEGATIVE
Trisomy 21 (Down syndrome): NEGATIVE

## 2018-11-17 LAB — HEMOGLOBINOPATHY EVALUATION
Ferritin: 8 ng/mL — ABNORMAL LOW (ref 15–150)
Hematocrit: 30 % — ABNORMAL LOW (ref 34.0–46.6)
Hemoglobin: 9.3 g/dL — ABNORMAL LOW (ref 11.1–15.9)
Hgb A2 Quant: 1.7 % — ABNORMAL LOW (ref 1.8–3.2)
Hgb A: 98.3 % (ref 96.4–98.8)
Hgb C: 0 %
Hgb F Quant: 0 % (ref 0.0–2.0)
Hgb S: 0 %
Hgb Solubility: NEGATIVE
Hgb Variant: 0 %
MCH: 23.2 pg — ABNORMAL LOW (ref 26.6–33.0)
MCHC: 31 g/dL — ABNORMAL LOW (ref 31.5–35.7)
MCV: 75 fL — ABNORMAL LOW (ref 79–97)
Platelets: 554 10*3/uL — ABNORMAL HIGH (ref 150–450)
RBC: 4.01 x10E6/uL (ref 3.77–5.28)
RDW: 14.1 % (ref 11.7–15.4)
WBC: 14.2 10*3/uL — ABNORMAL HIGH (ref 3.4–10.8)

## 2018-11-17 LAB — INHERITEST(R) CF/SMA PANEL

## 2018-11-19 ENCOUNTER — Telehealth (HOSPITAL_COMMUNITY): Payer: Self-pay | Admitting: Genetic Counselor

## 2018-11-19 NOTE — Telephone Encounter (Signed)
Called Ms. Spellman to discuss her negative carrier screening results for cystic fibrosis (CF), spinal muscular atrophy (SMA), and hemoglobinopathies. Ms. Doddridge was negative for the mutations analyzed in the CFTR gene associated with cystic fibrosis. Based on this negative result, she has a 1 in 316 residual risk of being a carrier for cystic fibrosis. 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 *3+80T>G SNP. Based on this negative result, she has a 1 in 375 residual risk of being a carrier for SMA. Ms. Balducci also had a normal hemoglobin electrophoresis, significantly reducing the risk of her being a carrier for hemoglobinopathies such as sickle cell disease. All of Ms. Peace's negative carrier screening results significantly decrease the chances of her pregnancies being affected by one of these conditions.  We discussed that as a part of Ms. Clink's hemoglobinopathy evaluation, she was identified to have iron deficiency. She indicated that she has had problems with iron deficiency anemia in the past and was interested in determining if she should take an iron supplement. I encouraged Ms. Lutes to contact her OBGYN provider for their recommendations on how to treat her iron deficiency. Ms. Yeary confirmed that she had no further questions at this time.  [name], MS Genetic Counselor

## 2018-12-05 ENCOUNTER — Ambulatory Visit (HOSPITAL_COMMUNITY): Payer: Medicaid Other

## 2018-12-20 ENCOUNTER — Ambulatory Visit (HOSPITAL_COMMUNITY)
Admission: RE | Admit: 2018-12-20 | Discharge: 2018-12-20 | Disposition: A | Discharge status: Home / Self Care | Payer: Medicaid Other | Source: Ambulatory Visit | Attending: Obstetrics | Admitting: Obstetrics

## 2018-12-20 ENCOUNTER — Ambulatory Visit (HOSPITAL_COMMUNITY): Payer: Medicaid Other | Admitting: *Deleted

## 2018-12-20 ENCOUNTER — Encounter (HOSPITAL_COMMUNITY): Payer: Self-pay

## 2018-12-20 ENCOUNTER — Other Ambulatory Visit (HOSPITAL_COMMUNITY): Payer: Self-pay | Admitting: *Deleted

## 2018-12-20 ENCOUNTER — Other Ambulatory Visit: Payer: Self-pay

## 2018-12-20 VITALS — BP 104/70 | HR 96 | Temp 98.5°F

## 2018-12-20 DIAGNOSIS — O3429 Maternal care due to uterine scar from other previous surgery: Secondary | ICD-10-CM | POA: Diagnosis not present

## 2018-12-20 DIAGNOSIS — O09292 Supervision of pregnancy with other poor reproductive or obstetric history, second trimester: Secondary | ICD-10-CM | POA: Insufficient documentation

## 2018-12-20 DIAGNOSIS — O09299 Supervision of pregnancy with other poor reproductive or obstetric history, unspecified trimester: Secondary | ICD-10-CM | POA: Diagnosis not present

## 2018-12-20 DIAGNOSIS — Z362 Encounter for other antenatal screening follow-up: Secondary | ICD-10-CM

## 2018-12-20 DIAGNOSIS — Z3A21 21 weeks gestation of pregnancy: Secondary | ICD-10-CM | POA: Diagnosis not present

## 2018-12-25 ENCOUNTER — Other Ambulatory Visit (HOSPITAL_COMMUNITY)
Admission: RE | Admit: 2018-12-25 | Discharge: 2018-12-25 | Disposition: A | Discharge status: Home / Self Care | Payer: Medicaid Other | Source: Ambulatory Visit | Attending: Family Medicine | Admitting: Family Medicine

## 2018-12-25 ENCOUNTER — Ambulatory Visit: Payer: Medicaid Other

## 2018-12-25 ENCOUNTER — Other Ambulatory Visit: Payer: Self-pay

## 2018-12-25 ENCOUNTER — Ambulatory Visit (INDEPENDENT_AMBULATORY_CARE_PROVIDER_SITE_OTHER): Payer: Medicaid Other

## 2018-12-25 DIAGNOSIS — N898 Other specified noninflammatory disorders of vagina: Secondary | ICD-10-CM | POA: Insufficient documentation

## 2018-12-25 NOTE — Progress Notes (Addendum)
Pt here for self-swab. Reports vaginal itching and thick, white vaginal discharge with a foul odor. Self-swab instructions given and specimen obtained. Notified pt we will contact her with any abnormal results; if she continues to have symptoms and does not receive an abnormal result she may follow up with the office. Pt verbalizes understanding.   Apolonio Schneiders RN 12/25/18  Patient seen and assessed by nursing staff during this encounter. I have reviewed the chart and agree with the documentation and plan.  Clarisa Fling, NP 12/26/2018 8:19 AM

## 2018-12-27 LAB — CERVICOVAGINAL ANCILLARY ONLY
Bacterial Vaginitis (gardnerella): POSITIVE — AB
Candida Glabrata: NEGATIVE
Candida Vaginitis: POSITIVE — AB
Chlamydia: NEGATIVE
Comment: NEGATIVE
Comment: NEGATIVE
Comment: NEGATIVE
Comment: NEGATIVE
Comment: NEGATIVE
Comment: NORMAL
Neisseria Gonorrhea: NEGATIVE
Trichomonas: NEGATIVE

## 2018-12-29 ENCOUNTER — Encounter: Payer: Self-pay | Admitting: Women's Health

## 2018-12-29 DIAGNOSIS — B373 Candidiasis of vulva and vagina: Secondary | ICD-10-CM | POA: Insufficient documentation

## 2018-12-29 DIAGNOSIS — B9689 Other specified bacterial agents as the cause of diseases classified elsewhere: Secondary | ICD-10-CM | POA: Insufficient documentation

## 2018-12-29 DIAGNOSIS — B3731 Acute candidiasis of vulva and vagina: Secondary | ICD-10-CM | POA: Insufficient documentation

## 2019-01-03 ENCOUNTER — Encounter: Payer: Self-pay | Admitting: Family Medicine

## 2019-01-03 ENCOUNTER — Other Ambulatory Visit: Payer: Self-pay

## 2019-01-03 ENCOUNTER — Telehealth: Payer: Self-pay | Admitting: Family Medicine

## 2019-01-03 ENCOUNTER — Ambulatory Visit (INDEPENDENT_AMBULATORY_CARE_PROVIDER_SITE_OTHER): Payer: Medicaid Other | Admitting: *Deleted

## 2019-01-03 DIAGNOSIS — O0990 Supervision of high risk pregnancy, unspecified, unspecified trimester: Secondary | ICD-10-CM | POA: Insufficient documentation

## 2019-01-03 NOTE — Progress Notes (Signed)
9:32 I called Daney for her telephone visit and could not leave a message because I heard a message the mailbox is full . Linda,RN 9:38  I called Keyanni for her telephone visit and could not leave a message because I heard a message the mailbox is full . I called her contact number for Elmyra Ricks twice and both times heard a busy signal. Will message registrars to reschedule appointment. Linda,RN

## 2019-01-03 NOTE — Telephone Encounter (Signed)
Attempted to call patient to get her rescheduled for her missed intake appointment. No answer and unable to leave a voicemail due to the mailbox being full. mychart message sent.

## 2019-01-08 ENCOUNTER — Encounter: Payer: Medicaid Other | Admitting: Family Medicine

## 2019-01-16 ENCOUNTER — Other Ambulatory Visit: Payer: Self-pay

## 2019-01-16 ENCOUNTER — Inpatient Hospital Stay (HOSPITAL_COMMUNITY)
Admission: AD | Admit: 2019-01-16 | Discharge: 2019-01-16 | Disposition: A | Discharge status: Home / Self Care | Payer: Medicaid Other | Attending: Obstetrics & Gynecology | Admitting: Obstetrics & Gynecology

## 2019-01-16 DIAGNOSIS — O98812 Other maternal infectious and parasitic diseases complicating pregnancy, second trimester: Secondary | ICD-10-CM | POA: Diagnosis not present

## 2019-01-16 DIAGNOSIS — N760 Acute vaginitis: Secondary | ICD-10-CM

## 2019-01-16 DIAGNOSIS — O26892 Other specified pregnancy related conditions, second trimester: Secondary | ICD-10-CM | POA: Insufficient documentation

## 2019-01-16 DIAGNOSIS — B9689 Other specified bacterial agents as the cause of diseases classified elsewhere: Secondary | ICD-10-CM | POA: Diagnosis not present

## 2019-01-16 DIAGNOSIS — R109 Unspecified abdominal pain: Secondary | ICD-10-CM | POA: Diagnosis not present

## 2019-01-16 DIAGNOSIS — Z7982 Long term (current) use of aspirin: Secondary | ICD-10-CM | POA: Insufficient documentation

## 2019-01-16 DIAGNOSIS — B373 Candidiasis of vulva and vagina: Secondary | ICD-10-CM | POA: Insufficient documentation

## 2019-01-16 DIAGNOSIS — O4692 Antepartum hemorrhage, unspecified, second trimester: Secondary | ICD-10-CM | POA: Insufficient documentation

## 2019-01-16 DIAGNOSIS — Z90721 Acquired absence of ovaries, unilateral: Secondary | ICD-10-CM | POA: Insufficient documentation

## 2019-01-16 DIAGNOSIS — Z3A25 25 weeks gestation of pregnancy: Secondary | ICD-10-CM | POA: Insufficient documentation

## 2019-01-16 DIAGNOSIS — B3731 Acute candidiasis of vulva and vagina: Secondary | ICD-10-CM

## 2019-01-16 LAB — WET PREP, GENITAL
Clue Cells Wet Prep HPF POC: NONE SEEN
Sperm: NONE SEEN
Trich, Wet Prep: NONE SEEN
Yeast Wet Prep HPF POC: NONE SEEN

## 2019-01-16 LAB — URINALYSIS, ROUTINE W REFLEX MICROSCOPIC
Bilirubin Urine: NEGATIVE
Glucose, UA: NEGATIVE mg/dL
Hgb urine dipstick: NEGATIVE
Ketones, ur: NEGATIVE mg/dL
Nitrite: NEGATIVE
Protein, ur: NEGATIVE mg/dL
Specific Gravity, Urine: 1.011 (ref 1.005–1.030)
pH: 7 (ref 5.0–8.0)

## 2019-01-16 MED ORDER — METRONIDAZOLE 500 MG PO TABS: 500.0000 mg | ORAL_TABLET | Freq: Two times a day (BID) | ORAL | 0 refills | Status: DC

## 2019-01-16 MED ORDER — TERCONAZOLE 0.4 % VA CREA: 1.0000 | TOPICAL_CREAM | Freq: Every day | VAGINAL | 0 refills | Status: AC

## 2019-01-16 NOTE — Discharge Instructions (Signed)

## 2019-01-16 NOTE — MAU Provider Note (Signed)
History     CSN: [id number]  Arrival date and time: 01/16/19 1410   First Provider Initiated Contact with Patient 01/16/19 1512      Chief Complaint  Patient presents with  . Vaginal Bleeding  . Abdominal Pain   Brittney Holmes is a 23 y.o. [id number] at [redacted]w[redacted]d who presents today with vaginal bleeding. She states that about one hour prior to arrival she was straining for a BM and then had some bright red bleeding. She denies any pain or contractions. She denies any LOF. She reports normal fetal movement. She has not started [facility] yet, but has an appointment for a visit on 01/23/2019. She states that at the end of October she was seen in the office and had a test for yeast and BV. She reports that she did not take the treatment for those, and is having worsening symptoms.   Vaginal Bleeding The patient's primary symptoms include vaginal discharge. The patient's pertinent negatives include no pelvic pain. This is a new problem. The current episode started today. The problem has been unchanged. The patient is experiencing no pain. The problem affects both sides. She is pregnant. Pertinent negatives include no chills, dysuria, fever, frequency, hematuria, nausea or vomiting. The vaginal discharge was bloody. The vaginal bleeding is spotting. She has not been passing clots. She has not been passing tissue. The symptoms are aggravated by bowel movements. She has tried nothing for the symptoms. Sexual activity: denies intercourse in the last 24 hours.     OB History    Gravida  4   Para  1   Term  1   Preterm  0   AB  2   Living  1     SAB  1   TAB  0   Ectopic  1   Multiple  0   Live Births  1           Past Medical History:  Diagnosis Date  . Chlamydia infection 05/31/2013  . 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  . Cornual  pregnancy 06/18/2016   s/p ELAP, wedge resection  . Hemoperitoneum due to rupture of right cornual ectopic pregnancy 06/18/2016  . Mild preeclampsia 10/07/2012  . Trichomonas infection 02/14/2014    Past Surgical History:  Procedure Laterality Date  . LAPAROTOMY  06/18/2016   Procedure: EXPLORATORY LAPAROTOMY, RIGHT CORNEAL WEDGE RESECTION,RIGHT SALPINGECTOMY;  Surgeon: Osborne Oman, MD;  Location: [facility];  Service: Gynecology;;  . SALPINGOOPHORECTOMY     rt. per pt    Family History  Problem Relation Age of Onset  . Cancer Maternal Grandmother        lung    Social History   Tobacco Use  . Smoking status: Never Smoker  . Smokeless tobacco: Never Used  Substance Use Topics  . Alcohol use: No  . Drug use: No    Allergies: No Known Allergies  Medications Prior to Admission  Medication Sig Dispense Refill Last Dose  . aspirin EC 81 MG tablet Take 81 mg by mouth daily.     . Prenatal Vit-Fe Fumarate-FA (PRENATAL VITAMIN PLUS LOW IRON) 27-1 MG TABS Take 1 tablet by mouth daily. 30 tablet 10   . terconazole (TERAZOL 3) 80 MG vaginal suppository Place 1 suppository (80 mg total) vaginally at bedtime. (Patient not taking: Reported on 10/31/2018) 3 suppository 0  Review of Systems  Constitutional: Negative for chills and fever.  Gastrointestinal: Negative for nausea and vomiting.  Genitourinary: Positive for vaginal bleeding and vaginal discharge. Negative for dysuria, frequency, hematuria and pelvic pain.   Physical Exam   Blood pressure 127/74, pulse 95, temperature 98.1 F (36.7 C), resp. rate 16, height 5\' 2"  (1.575 m), weight 49.4 kg, last menstrual period 07/28/2018, SpO2 100 %.  Physical Exam  Nursing note and vitals reviewed. Constitutional: She is oriented to person, place, and time. She appears well-developed and well-nourished. No distress.  HENT:  Head: Normocephalic.  Cardiovascular: Normal rate.  Respiratory: Effort normal.  GI: Soft. There is no  abdominal tenderness. There is no rebound.  Genitourinary:    Genitourinary Comments:  External: no lesion Vagina: small amount of white discharge. No bleeding seen at this time.  Cervix: pink, smooth, closed/thick  Uterus: AGA    Neurological: She is alert and oriented to person, place, and time.  Skin: Skin is warm and dry.  Psychiatric: She has a normal mood and affect.   NST:  Baseline: 130 Variability: moderate Accels: 15x15 Decels: none Toco: none  Results for orders placed or performed during the hospital encounter of 01/16/19 (from the past 24 hour(s))  Urinalysis, Routine w reflex microscopic     Status: Abnormal   Collection Time: 01/16/19  2:22 PM  Result Value Ref Range   Color, Urine YELLOW YELLOW   APPearance HAZY (A) CLEAR   Specific Gravity, Urine 1.011 1.005 - 1.030   pH 7.0 5.0 - 8.0   Glucose, UA NEGATIVE NEGATIVE mg/dL   Hgb urine dipstick NEGATIVE NEGATIVE   Bilirubin Urine NEGATIVE NEGATIVE   Ketones, ur NEGATIVE NEGATIVE mg/dL   Protein, ur NEGATIVE NEGATIVE mg/dL   Nitrite NEGATIVE NEGATIVE   Leukocytes,Ua LARGE (A) NEGATIVE   RBC / HPF 0-5 0 - 5 RBC/hpf   WBC, UA 11-20 0 - 5 WBC/hpf   Bacteria, UA FEW (A) NONE SEEN   Squamous Epithelial / LPF 6-10 0 - 5   Mucus PRESENT   Wet prep, genital     Status: Abnormal   Collection Time: 01/16/19  3:25 PM   Specimen: Cervix  Result Value Ref Range   Yeast Wet Prep HPF POC NONE SEEN NONE SEEN   Trich, Wet Prep NONE SEEN NONE SEEN   Clue Cells Wet Prep HPF POC NONE SEEN NONE SEEN   WBC, Wet Prep HPF POC MANY (A) NONE SEEN   Sperm NONE SEEN      MAU Course  Procedures  MDM   Assessment and Plan   1. Yeast infection involving the vagina and surrounding area   2. Bacterial vaginosis   3. Vaginal bleeding in pregnancy, second trimester   4. [redacted] weeks gestation of pregnancy    DC home 2nd/3rd Trimester precautions  Bleeding precautions PTL precautions  Fetal kick counts RX: flagyl 500mg  bid  x 7 day, and then terazol 7 daily x 7 days  Return to MAU as needed FU with OB as planned  Follow-up Information    [facility] Follow up.   Specialty: Obstetrics and Gynecology Contact information: [address] [place] [address], [address] [place] [id number] [phone]         [id number] DNP, CNM  01/16/19  4:29 PM

## 2019-01-16 NOTE — MAU Note (Signed)
.   Brittney Holmes is a 23 y.o. at [redacted]w[redacted]d here in MAU reporting: lower abdominal cramping and vaginal bleeding when she wipes that started today.   Onset of complaint: today Pain score: 3 Vitals:   01/16/19 1424  BP: 127/74  Pulse: 95  Resp: 16  Temp: 98.1 F (36.7 C)  SpO2: 100%     FHT:136 Lab orders placed from triage: UA

## 2019-01-17 ENCOUNTER — Encounter (HOSPITAL_COMMUNITY): Payer: Self-pay

## 2019-01-17 ENCOUNTER — Ambulatory Visit (HOSPITAL_COMMUNITY)
Admission: RE | Admit: 2019-01-17 | Discharge: 2019-01-17 | Disposition: A | Discharge status: Home / Self Care | Payer: Medicaid Other | Source: Ambulatory Visit | Attending: Obstetrics | Admitting: Obstetrics

## 2019-01-17 ENCOUNTER — Ambulatory Visit (HOSPITAL_COMMUNITY): Payer: Medicaid Other | Admitting: *Deleted

## 2019-01-17 DIAGNOSIS — B9689 Other specified bacterial agents as the cause of diseases classified elsewhere: Secondary | ICD-10-CM | POA: Insufficient documentation

## 2019-01-17 DIAGNOSIS — O09292 Supervision of pregnancy with other poor reproductive or obstetric history, second trimester: Secondary | ICD-10-CM

## 2019-01-17 DIAGNOSIS — Z362 Encounter for other antenatal screening follow-up: Secondary | ICD-10-CM

## 2019-01-17 DIAGNOSIS — O0990 Supervision of high risk pregnancy, unspecified, unspecified trimester: Secondary | ICD-10-CM

## 2019-01-17 DIAGNOSIS — B373 Candidiasis of vulva and vagina: Secondary | ICD-10-CM | POA: Insufficient documentation

## 2019-01-17 DIAGNOSIS — O23599 Infection of other part of genital tract in pregnancy, unspecified trimester: Secondary | ICD-10-CM | POA: Diagnosis not present

## 2019-01-17 DIAGNOSIS — Z3A25 25 weeks gestation of pregnancy: Secondary | ICD-10-CM | POA: Diagnosis not present

## 2019-01-17 DIAGNOSIS — B3731 Acute candidiasis of vulva and vagina: Secondary | ICD-10-CM

## 2019-01-17 DIAGNOSIS — O3429 Maternal care due to uterine scar from other previous surgery: Secondary | ICD-10-CM | POA: Diagnosis not present

## 2019-01-17 LAB — GC/CHLAMYDIA PROBE AMP (~~LOC~~) NOT AT ARMC
Chlamydia: NEGATIVE
Comment: NEGATIVE
Comment: NORMAL
Neisseria Gonorrhea: NEGATIVE

## 2019-01-18 ENCOUNTER — Other Ambulatory Visit (HOSPITAL_COMMUNITY): Payer: Self-pay | Admitting: *Deleted

## 2019-01-18 DIAGNOSIS — O09293 Supervision of pregnancy with other poor reproductive or obstetric history, third trimester: Secondary | ICD-10-CM

## 2019-01-21 ENCOUNTER — Encounter: Payer: Medicaid Other | Admitting: Family Medicine

## 2019-01-23 ENCOUNTER — Other Ambulatory Visit: Payer: Self-pay

## 2019-01-23 ENCOUNTER — Ambulatory Visit (INDEPENDENT_AMBULATORY_CARE_PROVIDER_SITE_OTHER): Payer: Medicaid Other | Admitting: *Deleted

## 2019-01-23 DIAGNOSIS — O0990 Supervision of high risk pregnancy, unspecified, unspecified trimester: Secondary | ICD-10-CM

## 2019-01-23 DIAGNOSIS — O09299 Supervision of pregnancy with other poor reproductive or obstetric history, unspecified trimester: Secondary | ICD-10-CM | POA: Insufficient documentation

## 2019-01-23 MED ORDER — BLOOD PRESSURE KIT DEVI: 1.0000 | 0 refills | Status: DC | PRN

## 2019-01-23 NOTE — Progress Notes (Signed)
I connected with  Brittney Holmes on 01/23/19 at  3:30 PM EST by telephone and verified that I am speaking with the correct person using two identifiers.   I discussed the limitations, risks, security and privacy concerns of performing an evaluation and management service by telephone and the availability of in person appointments. I also discussed with the patient that there may be a patient responsible charge related to this service. The patient expressed understanding and agreed to proceed. Explained I am completing her New OB Intake today. She is transferring care from [facility]. We discussed Her EDD and that it is based on  Early [place] . I reviewed her allergies, meds, OB History, Medical /Surgical history, and appropriate screenings. I explained I will send her the [employer] app- app sent to her while on phone- she will complete after call.  I explained we will send a blood pressure cuff to [facility] that will fill that prescription and they  will call her to verify her information. I asked her to bring the blood pressure cuff with her to her first ob appointment so we can show her how to use it. Explained  then we will have her take her blood pressure weekly and enter into the app. Explained she will have some visits in office and some virtually. She already has Community education officer. Reviewed appointment date/ time with her , our location and to wear mask, no visitors. Explained she will have exam, ob bloodwork as needed, hemoglobin a1C, cbg , genetic testing if desired, pap if needed.  She already has had anatomy US and has follow up scheduled.  She voices understanding.  Linda,RN 01/23/2019  3:17 PM

## 2019-01-23 NOTE — Patient Instructions (Signed)

## 2019-01-26 NOTE — Progress Notes (Signed)
Patient seen and assessed by nursing staff during this encounter. I have reviewed the chart and agree with the documentation and plan.  Mora Bellman, MD 01/26/2019 10:24 AM

## 2019-01-31 ENCOUNTER — Encounter: Payer: Medicaid Other | Admitting: Obstetrics & Gynecology

## 2019-02-02 ENCOUNTER — Encounter: Payer: Self-pay | Admitting: Family Medicine

## 2019-02-12 ENCOUNTER — Ambulatory Visit (INDEPENDENT_AMBULATORY_CARE_PROVIDER_SITE_OTHER): Payer: Medicaid Other | Admitting: Family Medicine

## 2019-02-12 ENCOUNTER — Other Ambulatory Visit: Payer: Self-pay

## 2019-02-12 ENCOUNTER — Other Ambulatory Visit (HOSPITAL_COMMUNITY)
Admission: RE | Admit: 2019-02-12 | Discharge: 2019-02-12 | Disposition: A | Discharge status: Home / Self Care | Payer: Medicaid Other | Source: Ambulatory Visit | Attending: Obstetrics & Gynecology | Admitting: Obstetrics & Gynecology

## 2019-02-12 VITALS — BP 104/71 | HR 96 | Wt 110.4 lb

## 2019-02-12 DIAGNOSIS — Z23 Encounter for immunization: Secondary | ICD-10-CM | POA: Diagnosis not present

## 2019-02-12 DIAGNOSIS — Z594 Lack of adequate food and safe drinking water: Secondary | ICD-10-CM

## 2019-02-12 DIAGNOSIS — O0990 Supervision of high risk pregnancy, unspecified, unspecified trimester: Secondary | ICD-10-CM | POA: Insufficient documentation

## 2019-02-12 DIAGNOSIS — O0993 Supervision of high risk pregnancy, unspecified, third trimester: Secondary | ICD-10-CM | POA: Diagnosis not present

## 2019-02-12 DIAGNOSIS — Z3A29 29 weeks gestation of pregnancy: Secondary | ICD-10-CM

## 2019-02-12 DIAGNOSIS — Z5941 Food insecurity: Secondary | ICD-10-CM

## 2019-02-12 NOTE — Progress Notes (Signed)
   Subjective:   Brittney Holmes is a 23 y.o. G3P1011 at [redacted]w[redacted]d by early ultrasound being seen today for her first obstetrical visit.  Her obstetrical history is significant for previous cornual ectopic resection. Patient does intend to breast feed. Pregnancy history fully reviewed.  Patient reports no complaints.  HISTORY: OB History  Gravida Para Term Preterm AB Living  3 1 1 0 1 1  SAB TAB Ectopic Multiple Live Births  0 0 1 0 1    # Outcome Date GA Lbr Len/2nd Weight Sex Delivery Anes PTL Lv  3 Current           2 Ectopic 06/18/16 [redacted]w[redacted]d    ECTOPIC     1 Term 10/06/12 [redacted]w[redacted]d 03:42 / 01:18 4 lb 15.8 oz (2.262 kg) F Vag-Spont EPI  LIV     Birth Comments: baby sga, preeclampsia     Name: Falkenstein,GIRL Takia     Apgar1: 9  Apgar5: 9   Past Medical History:  Diagnosis Date  . Chlamydia infection 05/31/2013  . Cornual ectopic pregnancy 06/18/2016   s/p ELAP, R cornual resection, salpingectomy [ ] Needs serial HCGs until undetectable Needs cesarean deliveries at 36-37 weeks for subsequent pregnancies.  . 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 Hemoperitoneum due to rupture of right cornual ectopic pregnancy 06/18/2016  . Mild preeclampsia 10/07/2012  . Trichomonas infection 02/14/2014   Past Surgical History:  Procedure Laterality Date  . LAPAROTOMY  06/18/2016   Procedure: EXPLORATORY LAPAROTOMY, RIGHT CORNEAL WEDGE RESECTION,RIGHT SALPINGECTOMY;  Surgeon: Ugonna A [name], MD;  Location: [facility];  Service: Gynecology;;  . SALPINGOOPHORECTOMY     rt. per pt   Family History  Problem Relation Age of Onset  . Cancer Maternal Grandmother        lung   Social History   Tobacco Use  . Smoking status: Never Smoker  . Smokeless tobacco: Never Used  Substance Use Topics  . Alcohol use: No  . Drug use: No   No Known Allergies Current Outpatient Medications on File Prior to Visit  Medication Sig Dispense Refill  . aspirin EC 81 MG tablet Take 81 mg by mouth daily.      . Prenatal Vit-Fe Fumarate-FA (PRENATAL VITAMIN PLUS LOW IRON) 27-1 MG TABS Take 1 tablet by mouth daily. 30 tablet 10  . Blood Pressure Monitoring (BLOOD PRESSURE KIT) DEVI 1 Device by Does not apply route as needed. 1 each 0   No current facility-administered medications on file prior to visit.     Exam   Vitals:   02/12/19 1404  BP: 104/71  Pulse: 96  Weight: 110 lb 6.4 oz (50.1 kg)   Fetal Heart Rate (bpm): 128  Pelvic Exam: Perineum: no hemorrhoids, normal perineum   Vulva: normal external genitalia, no lesions   Vagina:  normal mucosa, normal discharge   Cervix: no lesions and normal, pap smear done.    Adnexa: normal adnexa and no mass, fullness, tenderness   Bony Pelvis: average  System: General: well-developed, well-nourished female in no acute distress   Skin: normal coloration and turgor, no rashes   Neurologic: oriented, normal, negative, normal mood   Extremities: normal strength, tone, and muscle mass, ROM of all joints is normal   HEENT PERRLA, extraocular movement intact and sclera clear, anicteric   Mouth/Teeth mucous membranes moist, pharynx normal without lesions and dental hygiene good   Neck supple and no masses   Cardiovascular: regular rate and rhythm     Respiratory:  no respiratory distress, normal breath sounds   Abdomen: soft, non-tender; bowel sounds normal; no masses,  no organomegaly     Assessment:   Pregnancy: G3P1011 Patient Active Problem List   Diagnosis Date Noted  . History of pre-eclampsia in prior pregnancy, currently pregnant 01/23/2019  . Supervision of high risk pregnancy, antepartum 01/03/2019  . At high risk for complications of intrauterine pregnancy (IUP) 09/06/2018  . Abdominal pain 09/06/2018  . Positive urine pregnancy test 09/06/2018  . Acute upper respiratory infection 11/09/2016  . Constipation 11/09/2016  . Anxiety 11/09/2016     Plan:  1. Supervision of high risk pregnancy, antepartum New ob labs and TDaP.  Declined flu shot - Obstetric Panel, Including HIV - Tdap vaccine greater than or equal to 7yo IM - Culture, OB Urine - Hemoglobin A1c - Glucose Tolerance, 2 Hours w/1 Hour; Future - Ambulatory referral to Integrated Behavioral Health - Cytology - PAP( [facility]) May need C-section given wedge resection - will confer with Dr. [name]  2. Food insecurity - Ambulatory referral to Integrated Behavioral Health   Initial labs drawn. Continue prenatal vitamins. Genetic Screening discussed, NIPS: results reviewed. Ultrasound discussed; fetal anatomic survey: results reviewed. Problem list reviewed and updated.  Return in about 2 years (around 02/11/2021) for 28 wk labs, in person.                                                                                                                                                                                                                                                                                                                                                                                                                                                                                                                               

## 2019-02-13 ENCOUNTER — Telehealth: Payer: Self-pay | Admitting: Lactation Services

## 2019-02-13 ENCOUNTER — Ambulatory Visit (HOSPITAL_COMMUNITY): Admission: RE | Admit: 2019-02-13 | Payer: Medicaid Other | Source: Ambulatory Visit

## 2019-02-13 ENCOUNTER — Ambulatory Visit (HOSPITAL_COMMUNITY): Payer: Medicaid Other

## 2019-02-13 ENCOUNTER — Other Ambulatory Visit (HOSPITAL_COMMUNITY): Payer: Self-pay | Admitting: *Deleted

## 2019-02-13 ENCOUNTER — Encounter: Payer: Self-pay | Admitting: *Deleted

## 2019-02-13 ENCOUNTER — Encounter: Payer: Self-pay | Admitting: Family Medicine

## 2019-02-13 LAB — OBSTETRIC PANEL, INCLUDING HIV
Antibody Screen: NEGATIVE
Basophils Absolute: 0.1 10*3/uL (ref 0.0–0.2)
Basos: 1 %
EOS (ABSOLUTE): 0.5 10*3/uL — ABNORMAL HIGH (ref 0.0–0.4)
Eos: 4 %
HIV Screen 4th Generation wRfx: NONREACTIVE
Hematocrit: 22.6 % — ABNORMAL LOW (ref 34.0–46.6)
Hemoglobin: 6.6 g/dL — CL (ref 11.1–15.9)
Hepatitis B Surface Ag: NEGATIVE
Immature Grans (Abs): 0.2 10*3/uL — ABNORMAL HIGH (ref 0.0–0.1)
Immature Granulocytes: 2 %
Lymphocytes Absolute: 1.6 10*3/uL (ref 0.7–3.1)
Lymphs: 13 %
MCH: 20.3 pg — ABNORMAL LOW (ref 26.6–33.0)
MCHC: 29.2 g/dL — ABNORMAL LOW (ref 31.5–35.7)
MCV: 70 fL — ABNORMAL LOW (ref 79–97)
Monocytes Absolute: 0.9 10*3/uL (ref 0.1–0.9)
Monocytes: 7 %
Neutrophils Absolute: 9 10*3/uL — ABNORMAL HIGH (ref 1.4–7.0)
Neutrophils: 73 %
Platelets: 351 10*3/uL (ref 150–450)
RBC: 3.25 x10E6/uL — ABNORMAL LOW (ref 3.77–5.28)
RDW: 14.4 % (ref 11.7–15.4)
RPR Ser Ql: NONREACTIVE
Rh Factor: POSITIVE
Rubella Antibodies, IGG: 2.03 index (ref >0.99)
WBC: 12.3 10*3/uL — ABNORMAL HIGH (ref 3.4–10.8)

## 2019-02-13 LAB — HEMOGLOBIN A1C
Est. average glucose Bld gHb Est-mCnc: 114 mg/dL
Hgb A1c MFr Bld: 5.6 % (ref 4.8–5.6)

## 2019-02-13 NOTE — Telephone Encounter (Signed)
-----   Message from Donnamae Jude, MD sent at 02/13/2019 11:34 AM EST ----- Needs Feraheme set up please---orders placed.

## 2019-02-13 NOTE — Addendum Note (Signed)
Addended by: Donnamae Jude on: 02/13/2019 11:35 AM   Modules accepted: Orders

## 2019-02-13 NOTE — Telephone Encounter (Signed)
Feraheme infusion set up for 12/23 at 1 pm at [facility].   Called pt to inform her of appointment and instructions. Did not reach patient, mailbox full and cannot accept any messages. Sent My Chart Message to pt.

## 2019-02-13 NOTE — Telephone Encounter (Signed)
-----   Message from Tanya S Pratt, MD sent at 02/13/2019 11:34 AM EST ----- Needs Feraheme set up please---orders placed. 

## 2019-02-14 LAB — CULTURE, OB URINE

## 2019-02-14 LAB — URINE CULTURE, OB REFLEX

## 2019-02-14 LAB — CYTOLOGY - PAP: Diagnosis: NEGATIVE

## 2019-02-14 NOTE — BH Specialist Note (Signed)
Integrated Behavioral Health via Telemedicine Video Visit  02/14/2019 Brittney Holmes [id number]  Number of Integrated Behavioral Health visits: 1 Session Start time: 2:17  Session End time: 2:50 Total time: 25  Referring Provider: Tinnie Gens, MD Type of Visit: Video Patient/Family location: Home [facility] Provider location: [facility] All persons participating in visit: [name] and [facility] Brittney Holmes    Confirmed patient's address: Yes  Confirmed patient's phone number: Yes  Any changes to demographics: No   Confirmed patient's insurance: Yes  Any changes to patient's insurance: No   Discussed confidentiality: Yes   I connected with Brittney Holmes by a video enabled telemedicine application and verified that I am speaking with the correct person using two identifiers.     I discussed the limitations of evaluation and management by telemedicine and the availability of in person appointments.  I discussed that the purpose of this visit is to provide behavioral health care while limiting exposure to the novel coronavirus.   Discussed there is a possibility of technology failure and discussed alternative modes of communication if that failure occurs.  I discussed that engaging in this video visit, they consent to the provision of behavioral healthcare and the services will be billed under their insurance.  Patient and/or legal guardian expressed understanding and consented to video visit: Yes   PRESENTING CONCERNS: Patient and/or family reports the following symptoms/concerns: Pt states her primary symptom today is fatigue, attributed to low hemoglobin at 6.6 on 02/12/19. Pt requests referral to [facility].  Duration of problem: Current pregnancy; Severity of problem: mild  STRENGTHS (Protective Factors/Coping Skills): Supportive family  GOALS ADDRESSED: Patient will: 1.  Increase knowledge and/or ability of: healthy habits  2.  Demonstrate ability to: Increase healthy adjustment to  current life circumstances  INTERVENTIONS: Interventions utilized:  Psychoeducation and/or Health Education and Link to [employer] Standardized Assessments completed: GAD-7 and PHQ 9  ASSESSMENT: Patient currently experiencing Psychosocial stress.   Patient may benefit from psychoeducation and community referral .  PLAN: 1. Follow up with behavioral health clinician on : postpartum 2. Behavioral recommendations:  -Continue with plan to receive iron infusions, as recommended by medical provider -Set alarm on phone as a reminder to take prenatal vitamin daily until postpartum visit -Consider adding iron-rich foods to daily diet -Sign [id number] consent sent via text, for [facility] referral 3. Referral(s): Integrated Art gallery manager (In Clinic) and [employer]:  Food  I discussed the assessment and treatment plan with the patient and/or parent/guardian. They were provided an opportunity to ask questions and all were answered. They agreed with the plan and demonstrated an understanding of the instructions.   They were advised to call back or seek an in-person evaluation if the symptoms worsen or if the condition fails to improve as anticipated.  Brittney Holmes [facility]  Depression screen [facility] 2/9 02/26/2019 02/12/2019 01/23/2019 09/06/2018 05/31/2017  Decreased Interest 2 0 0 0 0  Down, Depressed, Hopeless 0 0 0 0 0  PHQ - 2 Score 2 0 0 0 0  Altered sleeping 1 1 0 - -  Tired, decreased energy 3 1 0 - -  Change in appetite 0 0 0 - -  Feeling bad or failure about yourself  0 0 0 - -  Trouble concentrating 0 0 0 - -  Moving slowly or fidgety/restless 1 0 0 - -  Suicidal thoughts 0 0 0 - -  PHQ-9 Score 7 2 0 - -   GAD 7 : Generalized Anxiety Score 02/26/2019  02/12/2019 01/23/2019 07/07/2016  Nervous, Anxious, on Edge 1 1 0 0  Control/stop worrying 0 0 0 0  Worry too much - different things 0 1 0 0  Trouble relaxing 0 0 0 0  Restless 0 0 0 0  Easily annoyed or irritable 0  1 0 1  Afraid - awful might happen 1 1 0 1  Total GAD 7 Score 2 4 0 2

## 2019-02-20 ENCOUNTER — Encounter (HOSPITAL_COMMUNITY)
Admission: RE | Admit: 2019-02-20 | Discharge: 2019-02-20 | Disposition: A | Discharge status: Home / Self Care | Payer: Medicaid Other | Source: Ambulatory Visit | Attending: Family Medicine | Admitting: Family Medicine

## 2019-02-20 ENCOUNTER — Other Ambulatory Visit: Payer: Self-pay

## 2019-02-20 DIAGNOSIS — D509 Iron deficiency anemia, unspecified: Secondary | ICD-10-CM | POA: Diagnosis present

## 2019-02-20 MED ORDER — SODIUM CHLORIDE 0.9 % IV SOLN
510.0000 mg | INTRAVENOUS | Status: DC
  Administered 2019-02-20: 510 mg via INTRAVENOUS
  Filled 2019-02-20: qty 17

## 2019-02-20 NOTE — Discharge Instructions (Signed)

## 2019-02-26 ENCOUNTER — Ambulatory Visit (INDEPENDENT_AMBULATORY_CARE_PROVIDER_SITE_OTHER): Payer: Medicaid Other | Admitting: Clinical

## 2019-02-26 ENCOUNTER — Other Ambulatory Visit: Payer: Self-pay

## 2019-02-26 DIAGNOSIS — Z658 Other specified problems related to psychosocial circumstances: Secondary | ICD-10-CM | POA: Diagnosis not present

## 2019-02-27 ENCOUNTER — Ambulatory Visit (HOSPITAL_COMMUNITY)
Admission: RE | Admit: 2019-02-27 | Discharge: 2019-02-27 | Disposition: A | Discharge status: Home / Self Care | Payer: Medicaid Other | Source: Ambulatory Visit | Attending: Family Medicine | Admitting: Family Medicine

## 2019-02-27 ENCOUNTER — Other Ambulatory Visit: Payer: Self-pay

## 2019-02-27 DIAGNOSIS — D509 Iron deficiency anemia, unspecified: Secondary | ICD-10-CM | POA: Diagnosis not present

## 2019-02-27 MED ORDER — SODIUM CHLORIDE 0.9 % IV SOLN: Freq: Once | INTRAVENOUS | Status: AC

## 2019-02-27 MED ORDER — SODIUM CHLORIDE 0.9 % IV SOLN
510.0000 mg | INTRAVENOUS | Status: DC
  Administered 2019-02-27: 510 mg via INTRAVENOUS
  Filled 2019-02-27: qty 17

## 2019-02-27 NOTE — Progress Notes (Addendum)
Pt here today for second dose of IV feraheme.  8 minutes into the infusion she called out and said she was nauseous, felt "whoozy", was short of breath, and shakey.  Stopped infusion, Hung NS bolus, had patient lay back, obtained FHR of 130, and called RRT per protocal.  Pt's VSS, and 10 min after NS bolus was started pt stated she was feeling better.  RRT nurse stated she would be here as soon as she can and that she would contact Dr Kennon Rounds, who pt stated was her OB

## 2019-02-27 NOTE — Progress Notes (Signed)
Dr Si Raider at bedside.  Pt has been monitored for an hour, is stable and stated she is feeling better.  Okay to DC pt home once normal saline finished

## 2019-02-27 NOTE — Progress Notes (Signed)
1340 RRT nurse at bedside

## 2019-02-27 NOTE — Progress Notes (Signed)
On call OB called by RRT OB nurse and he stated he would come see the pt.

## 2019-02-27 NOTE — Progress Notes (Signed)
G2P1 at 31 3/7 weeks had reactions to 2nd feraheme infusion 8 minutes into treatment.  Infusion stopped.  IV bolus started. Pt placed on monitors for NST.  Dr Si Raider notified of pt status.  Will come and assess pt.

## 2019-02-27 NOTE — Progress Notes (Signed)
Patient presented today for second dose of feraheme. Received first dose 1 week ago without incident. Pt is a 23 yo G3P1011 @ 31+3 who established care on 12/15. Labs at that visit showed severe microcytic anemia (h 6.6, mcv 70).   Today at 13:22, 8 minutes through the 15 minute infusion of feraheme, patient developed nausea and a sensation of chest tightness. Vital signs prior, during, and after were stable with bp ranging 108-127/76-82. Pt monitored for 30 minutes with reactive nst, no decels. Patient was treated with stopping infusion and administration of 1 L NS. I was notified approximately 45 minutes after the reaction.   On my exam pt reports resolution of symptoms, only remaining symptom is feeling "jittery." Denies GI upset, denies sensation of swelling, denies chest tightness or shortness of breath. Denies history anaphylaxis or other allergic reactions.   With IV iron, it can be difficult to distinguish hypersensitivity reactions (more common) from true allergic reactions (less common). However, given symptoms of nausea and chest tightness, think prudent to treat as allergic reaction and not attempt re-start of feraheme. As symptoms completely resolved by the time I was notified, and as pt was monitored for 1 hour, and as fetal status is reassuring, I opted to hold on administering epinephrine. As reaction was mild, may be reasonable to attempt re-administration in the future if needed, with pre-treatment and at a slower rate. Pt has OB f/u scheduled for tomorrow. I gave her careful anaphylaxis precautions.

## 2019-02-27 NOTE — Progress Notes (Signed)
Dr Si Raider at bedside for assessment.  Reviewed strip with this RN.  Pt feels good fetal movement.  No leaking, bleeding or abdominal pain noted.  Pt has an appt with Dr Hulan Fray tomorrow.  Keep appt.  Feeling better after fluids. Pt may D/C home.

## 2019-02-28 ENCOUNTER — Other Ambulatory Visit: Payer: Medicaid Other

## 2019-02-28 ENCOUNTER — Encounter: Payer: Medicaid Other | Admitting: Obstetrics & Gynecology

## 2019-03-05 ENCOUNTER — Other Ambulatory Visit: Payer: Self-pay

## 2019-03-05 ENCOUNTER — Other Ambulatory Visit: Payer: Self-pay | Admitting: *Deleted

## 2019-03-05 ENCOUNTER — Other Ambulatory Visit: Payer: Medicaid Other

## 2019-03-05 DIAGNOSIS — O0990 Supervision of high risk pregnancy, unspecified, unspecified trimester: Secondary | ICD-10-CM

## 2019-03-05 NOTE — Progress Notes (Signed)
Patient needs IV iron due to profound iron deficiency anemia.

## 2019-03-06 ENCOUNTER — Ambulatory Visit (HOSPITAL_COMMUNITY): Payer: Medicaid Other | Admitting: *Deleted

## 2019-03-06 ENCOUNTER — Ambulatory Visit (HOSPITAL_COMMUNITY)
Admission: RE | Admit: 2019-03-06 | Discharge: 2019-03-06 | Disposition: A | Discharge status: Home / Self Care | Payer: Medicaid Other | Source: Ambulatory Visit | Attending: Obstetrics | Admitting: Obstetrics

## 2019-03-06 ENCOUNTER — Other Ambulatory Visit (HOSPITAL_COMMUNITY): Payer: Self-pay | Admitting: *Deleted

## 2019-03-06 ENCOUNTER — Encounter (HOSPITAL_COMMUNITY): Payer: Self-pay

## 2019-03-06 DIAGNOSIS — O0990 Supervision of high risk pregnancy, unspecified, unspecified trimester: Secondary | ICD-10-CM | POA: Insufficient documentation

## 2019-03-06 DIAGNOSIS — O3429 Maternal care due to uterine scar from other previous surgery: Secondary | ICD-10-CM

## 2019-03-06 DIAGNOSIS — O09299 Supervision of pregnancy with other poor reproductive or obstetric history, unspecified trimester: Secondary | ICD-10-CM | POA: Diagnosis not present

## 2019-03-06 DIAGNOSIS — Z8759 Personal history of other complications of pregnancy, childbirth and the puerperium: Secondary | ICD-10-CM

## 2019-03-06 DIAGNOSIS — O09293 Supervision of pregnancy with other poor reproductive or obstetric history, third trimester: Secondary | ICD-10-CM

## 2019-03-06 DIAGNOSIS — O99013 Anemia complicating pregnancy, third trimester: Secondary | ICD-10-CM | POA: Diagnosis not present

## 2019-03-06 DIAGNOSIS — Z3A32 32 weeks gestation of pregnancy: Secondary | ICD-10-CM

## 2019-03-06 DIAGNOSIS — Z362 Encounter for other antenatal screening follow-up: Secondary | ICD-10-CM | POA: Diagnosis not present

## 2019-03-06 LAB — GLUCOSE TOLERANCE, 2 HOURS W/ 1HR
Glucose, 1 hour: 134 mg/dL (ref 65–179)
Glucose, 2 hour: 119 mg/dL (ref 65–152)
Glucose, Fasting: 65 mg/dL (ref 65–91)

## 2019-03-07 ENCOUNTER — Encounter: Payer: Self-pay | Admitting: Emergency Medicine

## 2019-03-07 ENCOUNTER — Other Ambulatory Visit: Payer: Self-pay

## 2019-03-07 ENCOUNTER — Observation Stay
Admission: EM | Admit: 2019-03-07 | Discharge: 2019-03-08 | Disposition: A | Discharge status: Home / Self Care | Payer: Medicaid Other | Attending: Advanced Practice Midwife | Admitting: Advanced Practice Midwife

## 2019-03-07 DIAGNOSIS — Z888 Allergy status to other drugs, medicaments and biological substances status: Secondary | ICD-10-CM | POA: Insufficient documentation

## 2019-03-07 DIAGNOSIS — O99013 Anemia complicating pregnancy, third trimester: Secondary | ICD-10-CM | POA: Diagnosis not present

## 2019-03-07 DIAGNOSIS — G4489 Other headache syndrome: Secondary | ICD-10-CM | POA: Diagnosis not present

## 2019-03-07 DIAGNOSIS — R000 Tachycardia, unspecified: Secondary | ICD-10-CM | POA: Diagnosis not present

## 2019-03-07 DIAGNOSIS — O4700 False labor before 37 completed weeks of gestation, unspecified trimester: Secondary | ICD-10-CM | POA: Diagnosis present

## 2019-03-07 DIAGNOSIS — O4703 False labor before 37 completed weeks of gestation, third trimester: Secondary | ICD-10-CM

## 2019-03-07 DIAGNOSIS — O479 False labor, unspecified: Secondary | ICD-10-CM | POA: Diagnosis present

## 2019-03-07 DIAGNOSIS — Z3A32 32 weeks gestation of pregnancy: Secondary | ICD-10-CM | POA: Diagnosis not present

## 2019-03-07 LAB — URINALYSIS, COMPLETE (UACMP) WITH MICROSCOPIC
Bilirubin Urine: NEGATIVE
Glucose, UA: NEGATIVE mg/dL
Hgb urine dipstick: NEGATIVE
Ketones, ur: NEGATIVE mg/dL
Nitrite: NEGATIVE
Protein, ur: NEGATIVE mg/dL
Specific Gravity, Urine: 1.006 (ref 1.005–1.030)
pH: 6 (ref 5.0–8.0)

## 2019-03-07 LAB — BASIC METABOLIC PANEL
Anion gap: 8 (ref 5–15)
BUN: <5 mg/dL — ABNORMAL LOW (ref 6–20)
CO2: 23 mmol/L (ref 22–32)
Calcium: 9.2 mg/dL (ref 8.9–10.3)
Chloride: 104 mmol/L (ref 98–111)
Creatinine, Ser: 0.46 mg/dL (ref 0.44–1.00)
GFR calc Af Amer: >60 mL/min (ref >60)
GFR calc non Af Amer: >60 mL/min (ref >60)
Glucose, Bld: 87 mg/dL (ref 70–99)
Potassium: 3.4 mmol/L — ABNORMAL LOW (ref 3.5–5.1)
Sodium: 135 mmol/L (ref 135–145)

## 2019-03-07 LAB — WET PREP, GENITAL
Clue Cells Wet Prep HPF POC: NONE SEEN
Sperm: NONE SEEN
Trich, Wet Prep: NONE SEEN
Yeast Wet Prep HPF POC: NONE SEEN

## 2019-03-07 LAB — CBC
HCT: 32.5 % — ABNORMAL LOW (ref 36.0–46.0)
Hemoglobin: 9.1 g/dL — ABNORMAL LOW (ref 12.0–15.0)
MCH: 23.3 pg — ABNORMAL LOW (ref 26.0–34.0)
MCHC: 28 g/dL — ABNORMAL LOW (ref 30.0–36.0)
MCV: 83.1 fL (ref 80.0–100.0)
Platelets: 352 10*3/uL (ref 150–400)
RBC: 3.91 MIL/uL (ref 3.87–5.11)
WBC: 15.4 10*3/uL — ABNORMAL HIGH (ref 4.0–10.5)
nRBC: 0.1 % (ref 0.0–0.2)

## 2019-03-07 LAB — CHLAMYDIA/NGC RT PCR (ARMC ONLY)
Chlamydia Tr: NOT DETECTED
N gonorrhoeae: NOT DETECTED

## 2019-03-07 LAB — RAPID HIV SCREEN (HIV 1/2 AB+AG)
HIV 1/2 Antibodies: NONREACTIVE
HIV-1 P24 Antigen - HIV24: NONREACTIVE

## 2019-03-07 LAB — FETAL FIBRONECTIN: Fetal Fibronectin: POSITIVE — AB

## 2019-03-07 MED ORDER — SOD CITRATE-CITRIC ACID 500-334 MG/5ML PO SOLN: 30.0000 mL | ORAL | Status: DC | PRN

## 2019-03-07 MED ORDER — OXYTOCIN 40 UNITS IN NORMAL SALINE INFUSION - SIMPLE MED: 2.5000 [IU]/h | INTRAVENOUS | Status: DC

## 2019-03-07 MED ORDER — LACTATED RINGERS IV SOLN: INTRAVENOUS | Status: DC

## 2019-03-07 MED ORDER — SODIUM CHLORIDE 0.9% FLUSH: 3.0000 mL | Freq: Once | INTRAVENOUS | Status: DC

## 2019-03-07 MED ORDER — BUTORPHANOL TARTRATE 1 MG/ML IJ SOLN: 1.0000 mg | INTRAMUSCULAR | Status: DC | PRN

## 2019-03-07 MED ORDER — ONDANSETRON HCL 4 MG/2ML IJ SOLN: 4.0000 mg | Freq: Four times a day (QID) | INTRAMUSCULAR | Status: DC | PRN

## 2019-03-07 MED ORDER — OXYTOCIN BOLUS FROM INFUSION: 500.0000 mL | Freq: Once | INTRAVENOUS | Status: DC

## 2019-03-07 MED ORDER — LACTATED RINGERS IV SOLN: 500.0000 mL | INTRAVENOUS | Status: DC | PRN

## 2019-03-07 MED ORDER — LIDOCAINE HCL (PF) 1 % IJ SOLN: 30.0000 mL | INTRAMUSCULAR | Status: DC | PRN

## 2019-03-07 MED ORDER — BETAMETHASONE SOD PHOS & ACET 6 (3-3) MG/ML IJ SUSP
12.0000 mg | Freq: Once | INTRAMUSCULAR | Status: AC
  Administered 2019-03-07: 12 mg via INTRAMUSCULAR
  Filled 2019-03-07: qty 5

## 2019-03-07 NOTE — ED Triage Notes (Addendum)
C/O headache earlier today.  Felt "like I was going to pass out".  Also c/o lower abdominal pain.  Good fetal movement.  No bleeding or fluid leaking.  Patient is AAOx3. Skin warm and dry.  MAE equally and strong. NAD.  Patient states she is feeling anxious because she was pre-eclamptic in first pregnancy and is worried the same will happen with this pregnancy.  G2 P1  EDC:  04/28/2019. With planned C-Section around three weeks before due date.

## 2019-03-07 NOTE — H&P (Signed)
H&P  Brittney Holmes is an 24 y.o. female.  HPI: Started having contractions at 5 pm.  Her contractions are causing a mild irritating pain.  She is not in severe pain or double over.  She is able speak through the contractions.  She denies any leakage of fluid she denies any vaginal bleeding.  She is currently being treated for a yeast infection.  She is on day 3 of 7 for vaginal insert medications.  She is feeling normal fetal movement.  She is currently receiving prenatal care with the health department.  Her pregnancy has been complicated by iron deficiency anemia.  Her prior  hemoglobin was 6.6. She has had normal fetal growth this pregnancy.  Her most recent EFW was 1877 gm which was in the 26 percentile this was performed on 03/06/2019.  She denies any new sexual partners.  She reports that she has not had intercourse in the last 5 months. She does have some uterine tenderness but reports that it has been tender for a few weeks. No fevers at home.   Her prior pregnancies have been complicated by a cornual ruptured ectopic with wedge resection and a previous vaginal delivery complicated by IUGR and preeclampsia at 38 weeks.   Nursing Staff Provider  Office Location  [facility] Dating   8 wk US  Language  English Anatomy US   Normal  Flu Vaccine  Declined Genetic Screen  NIPS: normal XY   TDaP vaccine   02/12/2019 Hgb A1C or  GTT Early : Third trimester : WNL  Rhogam   not needed   LAB RESULTS   Feeding Plan  Breast Blood Type O/Positive/-- (12/15 1633)   Contraception  Antibody Negative (12/15 1633)  Circumcision  Rubella 2.03 (12/15 1633)  Pediatrician   RPR Non Reactive (12/15 1633)   Support Person  Mom - Joni Reiningicole HBsAg Negative (12/15 1633)   Prenatal Classes  HIV Non Reactive (12/15 1633)    Varicella   BTL Consent  GBS  (For PCN allergy, check sensitivities)        VBAC Consent  Pap  NIL 2020    Hgb Electro   low A2 (alpha thalassemia or anemia)    CF  negative     SMA  negative           Past Medical History:  Diagnosis Date  . Chlamydia infection 05/31/2013  . 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  . 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 Hemoperitoneum due to rupture of right cornual ectopic pregnancy 06/18/2016  . Mild preeclampsia 10/07/2012  . Trichomonas infection 02/14/2014    Past Surgical History:  Procedure Laterality Date  . LAPAROTOMY  06/18/2016   Procedure: EXPLORATORY LAPAROTOMY, RIGHT CORNEAL WEDGE RESECTION,RIGHT SALPINGECTOMY;  Surgeon: Tereso NewcomerUgonna A [name], MD;  Location: [facility];  Service: Gynecology;;  . SALPINGOOPHORECTOMY     rt. per pt    Family History  Problem Relation Age of Onset  . Cancer Maternal Grandmother        lung    Social History:  reports that she has never smoked. She has never used smokeless tobacco. She reports that she does not drink alcohol or use drugs.  Allergies:  Allergies  Allergen Reactions  . Feraheme [Ferumoxytol]     Light headed, shakey, nausea, short of breath    Medications: I  have reviewed the patient's current medications.  Results for orders placed or performed during the hospital encounter of 03/07/19 (from the past 48 hour(s))  Basic metabolic panel     Status: Abnormal   Collection Time: 03/07/19  6:39 PM  Result Value Ref Range   Sodium 135 135 - 145 mmol/L   Potassium 3.4 (L) 3.5 - 5.1 mmol/L   Chloride 104 98 - 111 mmol/L   CO2 23 22 - 32 mmol/L   Glucose, Bld 87 70 - 99 mg/dL   BUN <5 (L) 6 - 20 mg/dL   Creatinine, Ser 3.41 0.44 - 1.00 mg/dL   Calcium 9.2 8.9 - 93.7 mg/dL   GFR calc non Af Amer >60 >60 mL/min   GFR calc Af Amer >60 >60 mL/min   Anion gap 8 5 - 15    Comment: Performed at [facility], [address] [place], [place] [address]    [place] MFM OB FOLLOW UP  Result Date:  03/06/2019 ----------------------------------------------------------------------  OBSTETRICS REPORT                       (Signed Final 03/06/2019 02:03 pm) ---------------------------------------------------------------------- Patient Info  ID #:       [id number]                          D.O.B.:  1995-12-06 (23 yrs)  Name:       Brittney Holmes                     Visit Date: 03/06/2019 12:56 pm ---------------------------------------------------------------------- Performed By  Performed By:     Earley Brooke     Ref. Address:     [address]                    [address], [address]                                                             [address]  Attending:        Noralee Space MD        [facility]  Referred By:      [facility] ---------------------------------------------------------------------- Orders   #  Description                          Code         Ordered By   1  [place] MFM OB FOLLOW UP                  42683.41     Rosana Hoes  ----------------------------------------------------------------------   #  Order #                    Accession #  Episode #   1  [id number]                  [id number]                  [id number]  ---------------------------------------------------------------------- Indications   Poor obstetric history: Previous               O09.299   preeclampsia / FGR (4lb15oz at term),   history of FGR   [redacted] weeks gestation of pregnancy                Z3A.32   Uterine scar, other than C/S (cornual ectopic) O34.29   Encounter for antenatal screening for          Z36.3   malformations (Low Risk NIPS)   Anemia during pregnancy in third trimester     O99.013  ---------------------------------------------------------------------- Fetal Evaluation  Num Of Fetuses:         1  Fetal Heart Rate(bpm):  124  Cardiac Activity:       Observed  Presentation:           Cephalic  Placenta:               Anterior   P. Cord Insertion:      Previously Visualized  Amniotic Fluid  AFI FV:      Within normal limits  AFI Sum(cm)     %Tile       Largest Pocket(cm)  17.75           65          5.44  RUQ(cm)       RLQ(cm)       LUQ(cm)        LLQ(cm)  5.44          5.02          3.99           3.3 ---------------------------------------------------------------------- Biometry  BPD:      79.4  mm     G. Age:  31w 6d         26  %    CI:        74.81   %    70 - 86                                                          FL/HC:      21.4   %    19.1 - 21.3  HC:      291.3  mm     G. Age:  32w 1d         10  %    HC/AC:      1.05        0.96 - 1.17  AC:      277.7  mm     G. Age:  31w 6d         31  %    FL/BPD:     78.3   %    71 - 87  FL:       62.2  mm     G. Age:  32w 2d         32  %    FL/AC:      22.4   %  20 - 24  HUM:      55.4  mm     G. Age:  32w 2d         50  %  LV:        1.7  mm  Est. FW:    1877  gm      4 lb 2 oz     26  % ---------------------------------------------------------------------- OB History  Gravidity:    4         Term:   1        Prem:   0        SAB:   1  TOP:          0       Ectopic:  1        Living: 1 ---------------------------------------------------------------------- Gestational Age  LMP:           31w 4d        Date:  07/28/18                 EDD:   05/04/19  U/S Today:     32w 0d                                        EDD:   05/01/19  Best:          32w 3d     Det. ByLoman Chroman         EDD:   04/28/19                                      (09/21/18) ---------------------------------------------------------------------- Anatomy  Cranium:               Appears normal         Aortic Arch:            Previously seen  Cavum:                 Previously seen        Ductal Arch:            Previously seen  Ventricles:            Appears normal         Diaphragm:              Appears normal  Choroid Plexus:        Appears normal         Stomach:                Appears normal, left                                                                         sided  Cerebellum:            Previously seen        Abdomen:                Appears normal  Posterior Fossa:       Previously  seen        Abdominal Wall:         Previously seen  Nuchal Fold:           Previously seen        Cord Vessels:           Previously seen  Face:                  Orbits prev seen.      Kidneys:                Appear normal                         Profile WNL  Lips:                  Previously seen        Bladder:                Appears normal  Thoracic:              Previously seen        Spine:                  Previously seen  Heart:                 Appears normal         Upper Extremities:      Previously seen                         (4CH, axis, and                         situs)  RVOT:                  Previously seen        Lower Extremities:      Previously seen  LVOT:                  Previously seen  Other:  Female gender previously seen. ---------------------------------------------------------------------- Cervix Uterus Adnexa  Cervix  Not visualized (advanced GA >24wks)  Left Ovary  Previously seen.  Right Ovary  Previously seen ---------------------------------------------------------------------- Impression  Patient with history of fetal growth restriction return for fetal  growth assessment.  She has severe anemia and received  intravenous iron transfusions.  Her second transfusion was  stopped because of her symptoms of shortness of breath.  She takes oral iron supplements now.  Her most recent  hemoglobin was 6.6g/dL.  Fetal growth is appropriate for gestational age.  Amniotic fluid  is normal and good fetal activity seen.  We reassured the patient of the findings. ---------------------------------------------------------------------- Recommendations  -An appointment was made for her to return in 4 weeks for  fetal growth assessment. ----------------------------------------------------------------------                   Noralee Spaceavi Shankar, MD Electronically Signed Final Report   03/06/2019 02:03 pm ----------------------------------------------------------------------   Review of Systems  Constitutional: Negative for chills and fever.  HENT: Negative for congestion, hearing loss and sinus pain.   Respiratory: Negative for cough, shortness of breath and wheezing.   Cardiovascular: Negative for chest pain, palpitations and leg swelling.  Gastrointestinal: Negative for abdominal pain, constipation, diarrhea, nausea and vomiting.  Genitourinary: Negative for dysuria, flank pain, frequency, hematuria and urgency.  Musculoskeletal: Negative for  back pain.  Skin: Negative for rash.  Neurological: Negative for dizziness and headaches.  Psychiatric/Behavioral: Negative for suicidal ideas. The patient is not nervous/anxious.    Blood pressure 108/79, pulse (!) 106, temperature 99.5 F (37.5 C), temperature source Oral, resp. rate 18, height 5\' 2"  (1.575 m), weight 50.8 kg, last menstrual period 07/28/2018, SpO2 98 %. Physical Exam  Nursing note and vitals reviewed. Constitutional: She is oriented to person, place, and time. She appears well-developed and well-nourished.  HENT:  Head: Normocephalic and atraumatic.  Cardiovascular: Normal rate and regular rhythm.  Respiratory: Effort normal and breath sounds normal.  GI: Soft. Bowel sounds are normal.  Genitourinary:    Genitourinary Comments: Speculum: parous cervix, purulent/mucous discharge. No vaginal bleeding. SVE 03/19/01, vertex    Some diffuse uterine tenderness noted.   Musculoskeletal:        General: Normal range of motion.  Neurological: She is alert and oriented to person, place, and time.  Skin: Skin is warm and dry.  Psychiatric: She has a normal mood and affect. Her behavior is normal. Judgment and thought content normal.   NST: 120 bpm baseline, moderate variability, 15x15 accelerations, no decelerations. Tocometer :  irritability  Assessment/Plan: 24 yo G3P1011 [redacted]w[redacted]d 1.  Preterm contractions.  Does not currently appear to be in active labor.  Given her early gestational age and positive FFM and plans for early delivery will give betamethasone.  We will keep the patient in observation and continue to monitor overnight.  If any cervical changes noted or delivery is suspected will start magnesium for neuro prophylaxis.  2.  GBS gonorrhea chlamydia and wet prep sent.  Will treat based off of results if needed. Given uterine tenderness and discharge some concern. Elevated WBC.  No fever. Will monitor.  3.  SCDs 4.  Regular diet  Noel Rodier R Jowel Waltner 03/07/2019, 7:37 PM

## 2019-03-07 NOTE — ED Triage Notes (Signed)
Pt comes into the ED via EMS from home with c/o being [redacted] weeks pregnant and having a near syncople episode today. Denies any abd pain , bleeding or other sx.

## 2019-03-07 NOTE — OB Triage Note (Signed)
Patient states that she began having pain and tenderness in her lower right abdomen and top of her uterus. She rates pain 6/10. Denis LOF and vaginal bleeding. Reports +FM. EFM applied. Will continue to monitor.

## 2019-03-08 DIAGNOSIS — Z888 Allergy status to other drugs, medicaments and biological substances status: Secondary | ICD-10-CM | POA: Diagnosis not present

## 2019-03-08 DIAGNOSIS — O99013 Anemia complicating pregnancy, third trimester: Secondary | ICD-10-CM | POA: Diagnosis not present

## 2019-03-08 DIAGNOSIS — Z3A32 32 weeks gestation of pregnancy: Secondary | ICD-10-CM | POA: Diagnosis not present

## 2019-03-08 LAB — RPR: RPR Ser Ql: NONREACTIVE

## 2019-03-08 LAB — TYPE AND SCREEN
ABO/RH(D): O POS
Antibody Screen: NEGATIVE

## 2019-03-08 LAB — ABO/RH: ABO/RH(D): O POS

## 2019-03-08 MED ORDER — BETAMETHASONE SOD PHOS & ACET 6 (3-3) MG/ML IJ SUSP
12.0000 mg | Freq: Once | INTRAMUSCULAR | Status: AC
  Administered 2019-03-08: 23:00:00 12 mg via INTRAMUSCULAR

## 2019-03-08 NOTE — Discharge Summary (Signed)
Physician Discharge Summary  Patient ID: Brittney Holmes MRN: [id number] DOB/AGE: 1996/02/08 24 y.o.  Admit date: 03/07/2019 Discharge date: 03/08/2019  Admission Diagnoses: Preterm contraction    Discharge Diagnoses:  Active Problems:   [redacted] weeks gestation of pregnancy   Preterm contractions   Discharged Condition: stable  Hospital Course: 24 y.o. G3P1011 at 40w5dby Estimated Date of Delivery: 2/28/21admitted with preterm contractions.  The patient was started on course of BMZ.  Contractions subsided through the course of admission.  Cervix stable at 1cm.  +FM, no LOF, no VB.  Patient was discharged in stable condition after second course of BMZ.  Consults: None  Significant Diagnostic Studies:  Results for orders placed or performed during the hospital encounter of 03/07/19 (from the past 24 hour(s))  RPR     Status: None   Collection Time: 03/07/19 11:08 PM  Result Value Ref Range   RPR Ser Ql NON REACTIVE NON REACTIVE  Type and screen [facility]    Status: None   Collection Time: 03/07/19 11:08 PM  Result Value Ref Range   ABO/RH(D) O POS    Antibody Screen NEG    Sample Expiration      03/10/2019,2359 [facility] [address], [place] [address]  ABO/Rh     Status: None   Collection Time: 03/08/19 12:48 AM  Result Value Ref Range   ABO/RH(D)      O POS [facility] [address], [place] [address]     Treatments: steroids: betamethasone  Discharge Exam: Blood pressure 101/70, pulse (!) 104, temperature 98.4 F (36.9 C), temperature source Oral, resp. rate 16, height '5\' 2"'  (1.575 m), weight 50.8 kg, last menstrual period 07/28/2018, SpO2 97 %.   Disposition: Discharge disposition: 01-Home or Self Care       Discharge Instructions    Discharge activity:  No Restrictions   Complete by: As directed    Discharge diet:  No restrictions   Complete by: As directed    No  sexual activity restrictions   Complete by: As directed    Notify physician for a general feeling that "something is not right"   Complete by: As directed    Notify physician for increase or change in vaginal discharge   Complete by: As directed    Notify physician for intestinal cramps, with or without diarrhea, sometimes described as "gas pain"   Complete by: As directed    Notify physician for leaking of fluid   Complete by: As directed    Notify physician for low, dull backache, unrelieved by heat or Tylenol   Complete by: As directed    Notify physician for menstrual like cramps   Complete by: As directed    Notify physician for pelvic pressure   Complete by: As directed    Notify physician for uterine contractions.  These may be painless and feel like the uterus is tightening or the baby is  "balling up"   Complete by: As directed    Notify physician for vaginal bleeding   Complete by: As directed    PRETERM LABOR:  Includes any of the follwing symptoms that occur between 20 - [redacted] weeks gestation.  If these symptoms are not stopped, preterm labor can result in preterm delivery, placing your baby at risk   Complete by: As directed      Allergies as of 03/08/2019      Reactions  Feraheme [ferumoxytol]    Light headed, shakey, nausea, short of breath      Medication List    TAKE these medications   aspirin EC 81 MG tablet Take 81 mg by mouth daily.   Blood Pressure Kit Devi 1 Device by Does not apply route as needed.   Prenatal Vitamin Plus Low Iron 27-1 MG Tabs Take 1 tablet by mouth daily.        Signed: Malachy Mood 03/08/2019, 10:09 PM

## 2019-03-08 NOTE — Progress Notes (Signed)
S: Patient is comfortable resting in bed. She reports no contractions. She admits good fetal movement. She denies leakage of fluid or vaginal bleeding. Patient has questions about working- she is actively applying for and interviewing at job sites. Patient is advised to discuss this with her care provider. Precautions discussed. She is tolerating a regular diet. Plan of care discussed- discharge to home after 2nd dose BMZ tonight.  Her pregnancy has been complicated by iron deficiency anemia.  Her prior  hemoglobin was 6.6. She has had normal fetal growth this pregnancy.  Her most recent EFW was 1877 gm which was in the 26 percentile this was performed on 03/06/2019.   Her prior pregnancies have been complicated by a cornual ruptured ectopic with wedge resection and a previous vaginal delivery complicated by IUGR and preeclampsia at 38 weeks.  O: Vital Signs: BP (!) 101/54   Pulse (!) 112   Temp 97.9 F (36.6 C) (Oral)   Resp 16   Ht 5\' 2"  (1.575 m)   Wt 50.8 kg   LMP 07/28/2018   SpO2 97%   BMI 20.49 kg/m  Constitutional: Well nourished, well developed female in no acute distress.  HEENT: normal Skin: Warm and dry.  Cardiovascular: Regular rate and rhythm.   Extremity: no edema  Respiratory: Clear to auscultation bilateral. Normal respiratory effort Abdomen: FHT present Back: no CVAT Neuro: DTRs 2+, Cranial nerves grossly intact Psych: Alert and Oriented x3. No memory deficits. Normal mood and affect.  MS: normal gait, normal bilateral lower extremity ROM/strength/stability.  Results for Brittney, Holmes (MRN [name]) as of 03/08/2019 10:58  Ref. Range 03/07/2019 20:22 03/07/2019 20:29 03/07/2019 23:08 03/08/2019 00:48 03/08/2019 09:41  Fetal Fibronectin Latest Ref Range: NEGATIVE  POSITIVE (A)      Sample Expiration Unknown   03/10/2019,2359...    Antibody Screen Unknown   NEG    ABO/RH(D) Unknown   O POS O POS...   Specimen source GC/Chlam Unknown WOUND      Chlamydia Tr Latest Ref Range: NOT  DETECTED  NOT DETECTED      N gonorrhoeae Latest Ref Range: NOT DETECTED  NOT DETECTED      Yeast Wet Prep HPF POC Latest Ref Range: NONE SEEN   NONE SEEN     Trich, Wet Prep Latest Ref Range: NONE SEEN   NONE SEEN     Clue Cells Wet Prep HPF POC Latest Ref Range: NONE SEEN   NONE SEEN     WBC, Wet Prep HPF POC Latest Ref Range: NONE SEEN   MODERATE (A)     CULTURE, BETA STREP (GROUP B ONLY) Unknown Rpt      URINE CULTURE Unknown     Rpt  WET PREP, GENITAL Unknown  Rpt (A)      Results for Brittney, Holmes (MRN [name]) as of 03/08/2019 10:58  Ref. Range 03/07/2019 18:41  Appearance Latest Ref Range: CLEAR  HAZY (A)  Bilirubin Urine Latest Ref Range: NEGATIVE  NEGATIVE  Color, Urine Latest Ref Range: YELLOW  YELLOW (A)  Glucose, UA Latest Ref Range: NEGATIVE mg/dL NEGATIVE  Hgb urine dipstick Latest Ref Range: NEGATIVE  NEGATIVE  Ketones, ur Latest Ref Range: NEGATIVE mg/dL NEGATIVE  Leukocytes,Ua Latest Ref Range: NEGATIVE  LARGE (A)  Nitrite Latest Ref Range: NEGATIVE  NEGATIVE  pH Latest Ref Range: 5.0 - 8.0  6.0  Protein Latest Ref Range: NEGATIVE mg/dL NEGATIVE  Specific Gravity, Urine Latest Ref Range: 1.005 - 1.030  1.006  Bacteria, UA Latest Ref Range:  NONE SEEN  RARE (A)  Mucus Unknown PRESENT  RBC / HPF Latest Ref Range: 0 - 5 RBC/hpf 6-10  Squamous Epithelial / LPF Latest Ref Range: 0 - 5  6-10  WBC, UA Latest Ref Range: 0 - 5 WBC/hpf 11-20     A: 24 yo G3 P1011 with threatened preterm labor, IUP at [redacted]w[redacted]d, no current signs of active labor.  P: Continue current care Regular diet Urine culture ordered NST Q shift 2nd dose Betamethasone tonight scheduled for 10:50 Follow up scheduled with [facility] [place] [place]

## 2019-03-09 ENCOUNTER — Encounter: Payer: Self-pay | Admitting: Family Medicine

## 2019-03-09 DIAGNOSIS — R000 Tachycardia, unspecified: Secondary | ICD-10-CM | POA: Diagnosis not present

## 2019-03-09 DIAGNOSIS — R0789 Other chest pain: Secondary | ICD-10-CM | POA: Diagnosis not present

## 2019-03-09 DIAGNOSIS — R079 Chest pain, unspecified: Secondary | ICD-10-CM | POA: Diagnosis not present

## 2019-03-09 DIAGNOSIS — G4489 Other headache syndrome: Secondary | ICD-10-CM | POA: Diagnosis not present

## 2019-03-09 DIAGNOSIS — R531 Weakness: Secondary | ICD-10-CM | POA: Diagnosis not present

## 2019-03-09 LAB — CULTURE, BETA STREP (GROUP B ONLY)

## 2019-03-09 LAB — URINE CULTURE

## 2019-03-11 ENCOUNTER — Encounter: Payer: Self-pay | Admitting: Family Medicine

## 2019-03-11 ENCOUNTER — Telehealth: Payer: Self-pay

## 2019-03-11 ENCOUNTER — Encounter: Payer: Self-pay | Admitting: *Deleted

## 2019-03-11 NOTE — Telephone Encounter (Signed)
Pt called and stated that she was in the hospital on the 7th and tested positive for possible preterm labor.  I informed pt that because that was positive does not necessarily mean that she is going into labor.  I advised pt to watch out for her signs of labor- contractions less than 10 minutes apart, water breaking.  Pt verbalized understanding.   Addison Naegeli, RN 03/11/19

## 2019-03-14 ENCOUNTER — Ambulatory Visit (INDEPENDENT_AMBULATORY_CARE_PROVIDER_SITE_OTHER): Payer: Medicaid Other | Admitting: Obstetrics and Gynecology

## 2019-03-14 ENCOUNTER — Other Ambulatory Visit: Payer: Self-pay

## 2019-03-14 VITALS — BP 106/70 | HR 97 | Wt 114.0 lb

## 2019-03-14 DIAGNOSIS — O09293 Supervision of pregnancy with other poor reproductive or obstetric history, third trimester: Secondary | ICD-10-CM

## 2019-03-14 DIAGNOSIS — O0993 Supervision of high risk pregnancy, unspecified, third trimester: Secondary | ICD-10-CM

## 2019-03-14 DIAGNOSIS — O0990 Supervision of high risk pregnancy, unspecified, unspecified trimester: Secondary | ICD-10-CM

## 2019-03-14 DIAGNOSIS — Z3A33 33 weeks gestation of pregnancy: Secondary | ICD-10-CM

## 2019-03-14 DIAGNOSIS — O09299 Supervision of pregnancy with other poor reproductive or obstetric history, unspecified trimester: Secondary | ICD-10-CM | POA: Diagnosis not present

## 2019-03-14 DIAGNOSIS — D509 Iron deficiency anemia, unspecified: Secondary | ICD-10-CM

## 2019-03-14 MED ORDER — FERROUS SULFATE 220 (44 FE) MG/5ML PO ELIX: 220.0000 mg | ORAL_SOLUTION | Freq: Every day | ORAL | 3 refills | Status: DC

## 2019-03-14 NOTE — Patient Instructions (Addendum)
[facility]. Maternity assessment unit .

## 2019-03-14 NOTE — Progress Notes (Signed)
   PRENATAL VISIT NOTE  Subjective:  Brittney Holmes is a 24 y.o. G3P1011 at [redacted]w[redacted]d being seen today for ongoing prenatal care.  She is currently monitored for the following issues for this high-risk pregnancy and has Constipation; Anxiety; At high risk for complications of intrauterine pregnancy (IUP); Positive urine pregnancy test; Supervision of high risk pregnancy, antepartum; History of pre-eclampsia in prior pregnancy, currently pregnant; Microcytic anemia; [redacted] weeks gestation of pregnancy; and Preterm contractions on their problem list.  Patient reports headache.  Contractions: Irritability. Vag. Bleeding: None.  Movement: Present. Denies leaking of fluid. She was seen at [facility] recently for threatened preterm labor. She denies contractions at this time. Complains of occasional frontal HA. Has not tried to threat it. No issues with BP at home.   The following portions of the patient's history were reviewed and updated as appropriate: allergies, current medications, past family history, past medical history, past social history, past surgical history and problem list.   Objective:   Vitals:   03/14/19 1150  BP: 106/70  Pulse: 97  Weight: 114 lb (51.7 kg)    Fetal Status: Fetal Heart Rate (bpm): 155   Movement: Present     General:  Alert, oriented and cooperative. Patient is in no acute distress.  Skin: Skin is warm and dry. No rash noted.   Cardiovascular: Normal heart rate noted  Respiratory: Normal respiratory effort, no problems with respiration noted  Abdomen: Soft, gravid, appropriate for gestational age.  Pain/Pressure: Present     Pelvic: Cervical exam deferred        Extremities: Normal range of motion.  Edema: None  Mental Status: Normal mood and affect. Normal behavior. Normal judgment and thought content.   Assessment and Plan:  Pregnancy: G3P1011 at [redacted]w[redacted]d  1. Microcytic anemia  Rx: Iron elixir  Hgb better follow IV iron infusion.   2. History of  pre-eclampsia in prior pregnancy, currently pregnant  BP looks great Continue ASA.  Baseline Pre E labs today. Up from 6 to 9.6  3. Supervision of high risk pregnancy, antepartum  Continue MD schedule  Continue check BP at home. If headache persists treat it with OTC tylenol as directed. Go to MAU if HA does not improve.    Preterm labor symptoms and general obstetric precautions including but not limited to vaginal bleeding, contractions, leaking of fluid and fetal movement were reviewed in detail with the patient. Please refer to After Visit Summary for other counseling recommendations.   Return for MD only- in-person in 2 weeks .  Future Appointments  Date Time Provider Department Center  03/29/2019 11:15 AM Allie Bossier, MD WOC-WOCA WOC  04/03/2019  3:10 PM WH-MFC NURSE WH-MFC MFC-US  04/03/2019  3:15 PM WH-MFC [place] 4 WH-MFCUS MFC-US  04/19/2019 10:45 AM WOC-BEHAVIORAL HEALTH CLINICIAN WOC-WOCA WOC    Venia Carbon, NP

## 2019-03-15 ENCOUNTER — Encounter: Payer: Self-pay | Admitting: Family Medicine

## 2019-03-15 LAB — COMPREHENSIVE METABOLIC PANEL
ALT: 10 IU/L (ref 0–32)
AST: 17 IU/L (ref 0–40)
Albumin/Globulin Ratio: 1.3 (ref 1.2–2.2)
Albumin: 3.7 g/dL — ABNORMAL LOW (ref 3.9–5.0)
Alkaline Phosphatase: 127 IU/L — ABNORMAL HIGH (ref 39–117)
BUN/Creatinine Ratio: 16 (ref 9–23)
BUN: 8 mg/dL (ref 6–20)
Bilirubin Total: 0.5 mg/dL (ref 0.0–1.2)
CO2: 22 mmol/L (ref 20–29)
Calcium: 10 mg/dL (ref 8.7–10.2)
Chloride: 100 mmol/L (ref 96–106)
Creatinine, Ser: 0.49 mg/dL — ABNORMAL LOW (ref 0.57–1.00)
GFR calc Af Amer: 157 mL/min/{1.73_m2} (ref >59)
GFR calc non Af Amer: 136 mL/min/{1.73_m2} (ref >59)
Globulin, Total: 2.8 g/dL (ref 1.5–4.5)
Glucose: 79 mg/dL (ref 65–99)
Potassium: 4.1 mmol/L (ref 3.5–5.2)
Sodium: 133 mmol/L — ABNORMAL LOW (ref 134–144)
Total Protein: 6.5 g/dL (ref 6.0–8.5)

## 2019-03-15 LAB — PROTEIN / CREATININE RATIO, URINE
Creatinine, Urine: 63.9 mg/dL
Protein, Ur: 8 mg/dL
Protein/Creat Ratio: 125 mg/g creat (ref 0–200)

## 2019-03-15 LAB — CBC WITH DIFFERENTIAL/PLATELET
Basophils Absolute: 0.2 10*3/uL (ref 0.0–0.2)
Basos: 1 %
EOS (ABSOLUTE): 0.5 10*3/uL — ABNORMAL HIGH (ref 0.0–0.4)
Eos: 3 %
Hematocrit: 31.6 % — ABNORMAL LOW (ref 34.0–46.6)
Hemoglobin: 9.6 g/dL — ABNORMAL LOW (ref 11.1–15.9)
Lymphocytes Absolute: 1.4 10*3/uL (ref 0.7–3.1)
Lymphs: 9 %
MCH: 24.1 pg — ABNORMAL LOW (ref 26.6–33.0)
MCHC: 30.4 g/dL — ABNORMAL LOW (ref 31.5–35.7)
MCV: 79 fL (ref 79–97)
Monocytes Absolute: 0.6 10*3/uL (ref 0.1–0.9)
Monocytes: 4 %
Neutrophils Absolute: 13 10*3/uL — ABNORMAL HIGH (ref 1.4–7.0)
Neutrophils: 81 %
Platelets: 516 10*3/uL — ABNORMAL HIGH (ref 150–450)
RBC: 3.99 x10E6/uL (ref 3.77–5.28)
WBC: 16.1 10*3/uL — ABNORMAL HIGH (ref 3.4–10.8)

## 2019-03-15 LAB — IMMATURE CELLS: Metamyelocytes: 2 % — ABNORMAL HIGH (ref 0–0)

## 2019-03-29 ENCOUNTER — Other Ambulatory Visit: Payer: Self-pay

## 2019-03-29 ENCOUNTER — Ambulatory Visit (INDEPENDENT_AMBULATORY_CARE_PROVIDER_SITE_OTHER): Payer: Medicaid Other | Admitting: Obstetrics & Gynecology

## 2019-03-29 ENCOUNTER — Other Ambulatory Visit (HOSPITAL_COMMUNITY)
Admission: RE | Admit: 2019-03-29 | Discharge: 2019-03-29 | Disposition: A | Discharge status: Home / Self Care | Payer: Medicaid Other | Source: Ambulatory Visit | Attending: Obstetrics & Gynecology | Admitting: Obstetrics & Gynecology

## 2019-03-29 VITALS — BP 127/79 | HR 92 | Wt 119.0 lb

## 2019-03-29 DIAGNOSIS — O0993 Supervision of high risk pregnancy, unspecified, third trimester: Secondary | ICD-10-CM

## 2019-03-29 DIAGNOSIS — Z3A35 35 weeks gestation of pregnancy: Secondary | ICD-10-CM

## 2019-03-29 DIAGNOSIS — O0990 Supervision of high risk pregnancy, unspecified, unspecified trimester: Secondary | ICD-10-CM

## 2019-03-29 NOTE — Progress Notes (Signed)
   PRENATAL VISIT NOTE  Subjective:  Brittney Holmes is a 24 y.o. G3P1011 at [redacted]w[redacted]d being seen today for ongoing prenatal care.  She is currently monitored for the following issues for this high-risk pregnancy and has Constipation; Anxiety; At high risk for complications of intrauterine pregnancy (IUP); Positive urine pregnancy test; Supervision of high risk pregnancy, antepartum; History of pre-eclampsia in prior pregnancy, currently pregnant; Microcytic anemia; [redacted] weeks gestation of pregnancy; and Preterm contractions on their problem list.  Patient reports no complaints.  Contractions: Irritability. Vag. Bleeding: None.  Movement: Present. Denies leaking of fluid.   The following portions of the patient's history were reviewed and updated as appropriate: allergies, current medications, past family history, past medical history, past social history, past surgical history and problem list.   Objective:   Vitals:   03/29/19 1119  BP: 127/79  Pulse: 92  Weight: 119 lb (54 kg)    Fetal Status: Fetal Heart Rate (bpm): 134   Movement: Present     General:  Alert, oriented and cooperative. Patient is in no acute distress.  Skin: Skin is warm and dry. No rash noted.   Cardiovascular: Normal heart rate noted  Respiratory: Normal respiratory effort, no problems with respiration noted  Abdomen: Soft, gravid, appropriate for gestational age.  Pain/Pressure: Present     Pelvic: Cervical exam performed        Extremities: Normal range of motion.  Edema: None  Mental Status: Normal mood and affect. Normal behavior. Normal judgment and thought content.   Assessment and Plan:  Pregnancy: G3P1011 at [redacted]w[redacted]d 1. Supervision of high risk pregnancy, antepartum  - Culture, beta strep (group b only) - GC/Chlamydia probe amp ([place])not at [facility]  2. Anemia- now 9.6 after IV iron and now oral iron  3. Fetal growth restriction per MFM- at the 26% for growth this month, f/u US scheduled.  Preterm labor  symptoms and general obstetric precautions including but not limited to vaginal bleeding, contractions, leaking of fluid and fetal movement were reviewed in detail with the patient. Please refer to After Visit Summary for other counseling recommendations.   No follow-ups on file.  Future Appointments  Date Time Provider Department Center  04/03/2019  3:10 PM [facility] NURSE [facility] MFC-US  04/03/2019  3:15 PM WH-MFC [place] 4 WH-MFCUS MFC-US  04/05/2019 10:35 AM Conan Bowens, MD WOC-WOCA WOC  04/12/2019  9:35 AM Macon Large, Jethro Bastos, MD [facility] WOC  04/17/2019 11:15 AM [name], Jethro Bastos, MD WOC[facility]WOCA [facility]  04/19/2019 10:45 AM WOC-BEHAVIORAL HEALTH CLINICIAN WOC-WOCA WOC    Allie Bossier, MD

## 2019-03-29 NOTE — Progress Notes (Signed)
gbs

## 2019-04-01 LAB — GC/CHLAMYDIA PROBE AMP (~~LOC~~) NOT AT ARMC
Chlamydia: NEGATIVE
Comment: NEGATIVE
Comment: NORMAL
Neisseria Gonorrhea: NEGATIVE

## 2019-04-01 LAB — CULTURE, BETA STREP (GROUP B ONLY): Strep Gp B Culture: POSITIVE — AB

## 2019-04-02 ENCOUNTER — Encounter: Payer: Self-pay | Admitting: Family Medicine

## 2019-04-03 ENCOUNTER — Ambulatory Visit (HOSPITAL_COMMUNITY)
Admission: RE | Admit: 2019-04-03 | Discharge: 2019-04-03 | Disposition: A | Discharge status: Home / Self Care | Payer: Medicaid Other | Source: Ambulatory Visit | Attending: Obstetrics and Gynecology | Admitting: Obstetrics and Gynecology

## 2019-04-03 ENCOUNTER — Encounter (HOSPITAL_COMMUNITY): Payer: Self-pay

## 2019-04-03 ENCOUNTER — Other Ambulatory Visit: Payer: Self-pay

## 2019-04-03 ENCOUNTER — Ambulatory Visit (HOSPITAL_COMMUNITY): Payer: Medicaid Other | Admitting: *Deleted

## 2019-04-03 DIAGNOSIS — O09299 Supervision of pregnancy with other poor reproductive or obstetric history, unspecified trimester: Secondary | ICD-10-CM | POA: Insufficient documentation

## 2019-04-03 DIAGNOSIS — O3429 Maternal care due to uterine scar from other previous surgery: Secondary | ICD-10-CM | POA: Diagnosis not present

## 2019-04-03 DIAGNOSIS — Z3A36 36 weeks gestation of pregnancy: Secondary | ICD-10-CM | POA: Diagnosis not present

## 2019-04-03 DIAGNOSIS — Z362 Encounter for other antenatal screening follow-up: Secondary | ICD-10-CM

## 2019-04-03 DIAGNOSIS — Z8759 Personal history of other complications of pregnancy, childbirth and the puerperium: Secondary | ICD-10-CM | POA: Insufficient documentation

## 2019-04-03 DIAGNOSIS — O0990 Supervision of high risk pregnancy, unspecified, unspecified trimester: Secondary | ICD-10-CM | POA: Insufficient documentation

## 2019-04-03 DIAGNOSIS — O99013 Anemia complicating pregnancy, third trimester: Secondary | ICD-10-CM

## 2019-04-03 DIAGNOSIS — O09293 Supervision of pregnancy with other poor reproductive or obstetric history, third trimester: Secondary | ICD-10-CM | POA: Diagnosis not present

## 2019-04-04 NOTE — Patient Instructions (Signed)
[place]  04/04/2019   Your procedure is scheduled on:  04/08/2019  Arrive at 1030 at Entrance C on [address] at [facility]  and [employer]. You are invited to use the FREE valet parking or use the Visitor's parking deck.  Pick up the phone at the desk and dial [phone].  Call this number if you have problems the morning of surgery: [phone]  Remember:   Do not eat food:(After Midnight) Desps de medianoche.  Do not drink clear liquids: (After Midnight) Desps de medianoche.  Take these medicines the morning of surgery with A SIP OF WATER:  none   Do not wear jewelry, make-up or nail polish.  Do not wear lotions, powders, or perfumes. Do not wear deodorant.  Do not shave 48 hours prior to surgery.  Do not bring valuables to the hospital.  [facility] is not   responsible for any belongings or valuables brought to the hospital.  Contacts, dentures or bridgework may not be worn into surgery.  Leave suitcase in the car. After surgery it may be brought to your room.  For patients admitted to the hospital, checkout time is 11:00 AM the day of              discharge.      Please read over the following fact sheets that you were given:     Preparing for Surgery

## 2019-04-04 NOTE — Pre-Procedure Instructions (Signed)
Message left on her mother's contact number.

## 2019-04-05 ENCOUNTER — Encounter: Payer: Self-pay | Admitting: Obstetrics and Gynecology

## 2019-04-05 ENCOUNTER — Telehealth (INDEPENDENT_AMBULATORY_CARE_PROVIDER_SITE_OTHER): Payer: Medicaid Other | Admitting: Obstetrics and Gynecology

## 2019-04-05 ENCOUNTER — Encounter: Payer: Self-pay | Admitting: Obstetrics & Gynecology

## 2019-04-05 ENCOUNTER — Other Ambulatory Visit: Payer: Self-pay

## 2019-04-05 ENCOUNTER — Telehealth (HOSPITAL_COMMUNITY): Payer: Self-pay | Admitting: *Deleted

## 2019-04-05 ENCOUNTER — Encounter: Payer: Self-pay | Admitting: *Deleted

## 2019-04-05 DIAGNOSIS — O09299 Supervision of pregnancy with other poor reproductive or obstetric history, unspecified trimester: Secondary | ICD-10-CM

## 2019-04-05 DIAGNOSIS — Z3A36 36 weeks gestation of pregnancy: Secondary | ICD-10-CM

## 2019-04-05 DIAGNOSIS — O99820 Streptococcus B carrier state complicating pregnancy: Secondary | ICD-10-CM

## 2019-04-05 DIAGNOSIS — O09293 Supervision of pregnancy with other poor reproductive or obstetric history, third trimester: Secondary | ICD-10-CM

## 2019-04-05 DIAGNOSIS — O0990 Supervision of high risk pregnancy, unspecified, unspecified trimester: Secondary | ICD-10-CM

## 2019-04-05 DIAGNOSIS — O0993 Supervision of high risk pregnancy, unspecified, third trimester: Secondary | ICD-10-CM

## 2019-04-05 NOTE — Telephone Encounter (Signed)
Preadmission screen  

## 2019-04-05 NOTE — Progress Notes (Signed)
I connected with  Brittney Holmes on 04/05/19 at 1032 by MyChart and verified that I am speaking with the correct person using two identifiers.   I discussed the limitations, risks, security and privacy concerns of performing an evaluation and management service by telephone and the availability of in person appointments. I also discussed with the patient that there may be a patient responsible charge related to this service. The patient expressed understanding and agreed to proceed.  Pt reports mild headache. Denies any other s/s hypertension. BP is 126/87. Educated pt that she should check her BP if she has any headache, blurry vision, or dizziness. Reviewed BP parameters and when to go to MAU for further evaluation.  Brittney Bicker, RN 04/05/2019  10:32 AM

## 2019-04-05 NOTE — Progress Notes (Signed)
   TELEHEALTH OBSTETRICS PRENATAL VIRTUAL VIDEO VISIT ENCOUNTER NOTE  Provider location: Center for [facility] [place]   I connected with Brittney Holmes on 04/05/19 at 10:35 AM EST by MyChart Video Encounter at home and verified that I am speaking with the correct person using two identifiers.   I discussed the limitations, risks, security and privacy concerns of performing an evaluation and management service virtually and the availability of in person appointments. I also discussed with the patient that there may be a patient responsible charge related to this service. The patient expressed understanding and agreed to proceed. Subjective:  Brittney Holmes is a 24 y.o. G3P1011 at [redacted]w[redacted]d being seen today for ongoing prenatal care.  She is currently monitored for the following issues for this high-risk pregnancy and has Constipation; Anxiety; At high risk for complications of intrauterine pregnancy (IUP); Supervision of high risk pregnancy, antepartum; History of pre-eclampsia in prior pregnancy, currently pregnant; Microcytic anemia; Preterm contractions; and GBS (group B Streptococcus carrier), +RV culture, currently pregnant on their problem list.  Patient reports no complaints.  Contractions: Irregular. Vag. Bleeding: None.  Movement: Present. Denies any leaking of fluid.   The following portions of the patient's history were reviewed and updated as appropriate: allergies, current medications, past family history, past medical history, past social history, past surgical history and problem list.   Objective:   Vitals:   04/05/19 1037  BP: 126/87  Pulse: 83   Fetal Status:     Movement: Present     General:  Alert, oriented and cooperative. Patient is in no acute distress.  Respiratory: Normal respiratory effort, no problems with respiration noted  Mental Status: Normal mood and affect. Normal behavior. Normal judgment and thought content.  Rest of physical exam deferred due to type of  encounter  Imaging:  Assessment and Plan:  Pregnancy: G3P1011 at [redacted]w[redacted]d  1. GBS (group B Streptococcus carrier), +RV culture, currently pregnant  2. History of pre-eclampsia in prior pregnancy, currently pregnant  3. Supervision of high risk pregnancy, antepartum Scheduled for 1CS for h/o cornual wedge resection with ruptured ectopic  Preterm labor symptoms and general obstetric precautions including but not limited to vaginal bleeding, contractions, leaking of fluid and fetal movement were reviewed in detail with the patient. I discussed the assessment and treatment plan with the patient. The patient was provided an opportunity to ask questions and all were answered. The patient agreed with the plan and demonstrated an understanding of the instructions. The patient was advised to call back or seek an in-person office evaluation/go to MAU at [facility] for any urgent or concerning symptoms. Please refer to After Visit Summary for other counseling recommendations.   I provided 15 minutes of face-to-face time during this encounter.  Return in about 5 weeks (around 05/10/2019) for post partum check.  Future Appointments  Date Time Provider Department Center  04/06/2019  8:40 AM MC-MAU 1 MC-INDC None  04/12/2019  9:35 AM [name], Jethro Bastos, MD WOC-WOCA WOC  04/17/2019 11:15 AM [name], Jethro Bastos, MD WOC-WOCA WOC  04/19/2019 10:45 AM WOC-BEHAVIORAL HEALTH CLINICIAN WOC-WOCA WOC    Conan Bowens, MD Center for [facility], [facility]

## 2019-04-06 ENCOUNTER — Other Ambulatory Visit (HOSPITAL_COMMUNITY)
Admission: RE | Admit: 2019-04-06 | Discharge: 2019-04-06 | Disposition: A | Discharge status: Home / Self Care | Payer: Medicaid Other | Source: Ambulatory Visit | Attending: Obstetrics and Gynecology | Admitting: Obstetrics and Gynecology

## 2019-04-06 ENCOUNTER — Other Ambulatory Visit (HOSPITAL_COMMUNITY)
Admission: RE | Admit: 2019-04-06 | Discharge: 2019-04-06 | Disposition: A | Discharge status: Home / Self Care | Payer: Medicaid Other | Source: Ambulatory Visit | Attending: Obstetrics & Gynecology | Admitting: Obstetrics & Gynecology

## 2019-04-06 DIAGNOSIS — Z20822 Contact with and (suspected) exposure to covid-19: Secondary | ICD-10-CM | POA: Diagnosis not present

## 2019-04-06 DIAGNOSIS — Z01812 Encounter for preprocedural laboratory examination: Secondary | ICD-10-CM | POA: Insufficient documentation

## 2019-04-06 LAB — SARS CORONAVIRUS 2 (TAT 6-24 HRS): SARS Coronavirus 2: NEGATIVE

## 2019-04-08 ENCOUNTER — Inpatient Hospital Stay (HOSPITAL_COMMUNITY)
Admission: RE | Admit: 2019-04-08 | Discharge: 2019-04-10 | DRG: 785 | Disposition: A | Discharge status: Home / Self Care | Payer: Medicaid Other | Attending: Obstetrics & Gynecology | Admitting: Obstetrics & Gynecology

## 2019-04-08 ENCOUNTER — Inpatient Hospital Stay (HOSPITAL_COMMUNITY): Payer: Medicaid Other | Admitting: Anesthesiology

## 2019-04-08 ENCOUNTER — Encounter (HOSPITAL_COMMUNITY)
Admission: RE | Disposition: A | Discharge status: Home / Self Care | Payer: Self-pay | Source: Home / Self Care | Attending: Obstetrics & Gynecology

## 2019-04-08 ENCOUNTER — Other Ambulatory Visit: Payer: Self-pay

## 2019-04-08 ENCOUNTER — Encounter (HOSPITAL_COMMUNITY): Payer: Self-pay | Admitting: Obstetrics & Gynecology

## 2019-04-08 DIAGNOSIS — O99824 Streptococcus B carrier state complicating childbirth: Secondary | ICD-10-CM | POA: Diagnosis present

## 2019-04-08 DIAGNOSIS — Z302 Encounter for sterilization: Secondary | ICD-10-CM

## 2019-04-08 DIAGNOSIS — Z98891 History of uterine scar from previous surgery: Secondary | ICD-10-CM

## 2019-04-08 DIAGNOSIS — Z20822 Contact with and (suspected) exposure to covid-19: Secondary | ICD-10-CM | POA: Diagnosis present

## 2019-04-08 DIAGNOSIS — Z9079 Acquired absence of other genital organ(s): Secondary | ICD-10-CM

## 2019-04-08 DIAGNOSIS — Z3A37 37 weeks gestation of pregnancy: Secondary | ICD-10-CM

## 2019-04-08 DIAGNOSIS — O34211 Maternal care for low transverse scar from previous cesarean delivery: Principal | ICD-10-CM | POA: Diagnosis present

## 2019-04-08 DIAGNOSIS — O26893 Other specified pregnancy related conditions, third trimester: Secondary | ICD-10-CM | POA: Diagnosis present

## 2019-04-08 DIAGNOSIS — O09299 Supervision of pregnancy with other poor reproductive or obstetric history, unspecified trimester: Secondary | ICD-10-CM

## 2019-04-08 DIAGNOSIS — O99820 Streptococcus B carrier state complicating pregnancy: Secondary | ICD-10-CM

## 2019-04-08 DIAGNOSIS — Z90721 Acquired absence of ovaries, unilateral: Secondary | ICD-10-CM

## 2019-04-08 HISTORY — PX: UNILATERAL SALPINGECTOMY: SHX6160

## 2019-04-08 LAB — CBC
HCT: 34.6 % — ABNORMAL LOW (ref 36.0–46.0)
Hemoglobin: 10.5 g/dL — ABNORMAL LOW (ref 12.0–15.0)
MCH: 25.2 pg — ABNORMAL LOW (ref 26.0–34.0)
MCHC: 30.3 g/dL (ref 30.0–36.0)
MCV: 83 fL (ref 80.0–100.0)
Platelets: 398 10*3/uL (ref 150–400)
RBC: 4.17 MIL/uL (ref 3.87–5.11)
RDW: 24.2 % — ABNORMAL HIGH (ref 11.5–15.5)
WBC: 12 10*3/uL — ABNORMAL HIGH (ref 4.0–10.5)
nRBC: 0 % (ref 0.0–0.2)

## 2019-04-08 LAB — BASIC METABOLIC PANEL
Anion gap: 12 (ref 5–15)
BUN: <5 mg/dL — ABNORMAL LOW (ref 6–20)
CO2: 20 mmol/L — ABNORMAL LOW (ref 22–32)
Calcium: 9.9 mg/dL (ref 8.9–10.3)
Chloride: 103 mmol/L (ref 98–111)
Creatinine, Ser: 0.59 mg/dL (ref 0.44–1.00)
GFR calc Af Amer: >60 mL/min (ref >60)
GFR calc non Af Amer: >60 mL/min (ref >60)
Glucose, Bld: 68 mg/dL — ABNORMAL LOW (ref 70–99)
Potassium: 3.9 mmol/L (ref 3.5–5.1)
Sodium: 135 mmol/L (ref 135–145)

## 2019-04-08 LAB — TYPE AND SCREEN
ABO/RH(D): O POS
Antibody Screen: NEGATIVE

## 2019-04-08 LAB — RPR: RPR Ser Ql: NONREACTIVE

## 2019-04-08 SURGERY — UNILATERAL SALPINGECTOMY
Anesthesia: Spinal | Site: Abdomen | Laterality: Right | Wound class: Clean

## 2019-04-08 MED ORDER — IBUPROFEN 800 MG PO TABS: 800.0000 mg | ORAL_TABLET | Freq: Four times a day (QID) | ORAL | Status: DC

## 2019-04-08 MED ORDER — SCOPOLAMINE 1 MG/3DAYS TD PT72
MEDICATED_PATCH | TRANSDERMAL | Status: AC
  Filled 2019-04-08: qty 1

## 2019-04-08 MED ORDER — WITCH HAZEL-GLYCERIN EX PADS: 1.0000 "application " | MEDICATED_PAD | CUTANEOUS | Status: DC | PRN

## 2019-04-08 MED ORDER — PHENYLEPHRINE HCL-NACL 20-0.9 MG/250ML-% IV SOLN
INTRAVENOUS | Status: DC | PRN
  Administered 2019-04-08: 60 ug/min via INTRAVENOUS

## 2019-04-08 MED ORDER — SODIUM CHLORIDE 0.9 % IR SOLN
Status: DC | PRN
  Administered 2019-04-08: 1

## 2019-04-08 MED ORDER — OXYCODONE HCL 5 MG PO TABS
5.0000 mg | ORAL_TABLET | ORAL | Status: DC | PRN
  Filled 2019-04-08: qty 1

## 2019-04-08 MED ORDER — NALBUPHINE HCL 10 MG/ML IJ SOLN: 5.0000 mg | INTRAMUSCULAR | Status: DC | PRN

## 2019-04-08 MED ORDER — COCONUT OIL OIL
1.0000 "application " | TOPICAL_OIL | Status: DC | PRN
  Administered 2019-04-10: 1 via TOPICAL

## 2019-04-08 MED ORDER — DEXAMETHASONE SODIUM PHOSPHATE 10 MG/ML IJ SOLN
INTRAMUSCULAR | Status: DC | PRN
  Administered 2019-04-08: 10 mg via INTRAVENOUS

## 2019-04-08 MED ORDER — FERROUS SULFATE 325 (65 FE) MG PO TABS
325.0000 mg | ORAL_TABLET | Freq: Two times a day (BID) | ORAL | Status: DC
  Administered 2019-04-09: 325 mg via ORAL
  Filled 2019-04-08 (×2): qty 1

## 2019-04-08 MED ORDER — SODIUM CHLORIDE 0.9 % IV SOLN
INTRAVENOUS | Status: AC
  Filled 2019-04-08: qty 2

## 2019-04-08 MED ORDER — DEXAMETHASONE SODIUM PHOSPHATE 10 MG/ML IJ SOLN
INTRAMUSCULAR | Status: AC
  Filled 2019-04-08: qty 1

## 2019-04-08 MED ORDER — ENOXAPARIN SODIUM 40 MG/0.4ML ~~LOC~~ SOLN
40.0000 mg | SUBCUTANEOUS | Status: DC
  Administered 2019-04-09: 40 mg via SUBCUTANEOUS
  Filled 2019-04-08: qty 0.4

## 2019-04-08 MED ORDER — SODIUM CHLORIDE 0.9 % IV SOLN
2.0000 g | INTRAVENOUS | Status: AC
  Administered 2019-04-08: 11:00:00 2 g via INTRAVENOUS

## 2019-04-08 MED ORDER — MEASLES, MUMPS & RUBELLA VAC IJ SOLR: 0.5000 mL | Freq: Once | INTRAMUSCULAR | Status: DC

## 2019-04-08 MED ORDER — SENNOSIDES-DOCUSATE SODIUM 8.6-50 MG PO TABS
2.0000 | ORAL_TABLET | ORAL | Status: DC
  Filled 2019-04-08: qty 2

## 2019-04-08 MED ORDER — NALOXONE HCL 4 MG/10ML IJ SOLN
1.0000 ug/kg/h | INTRAVENOUS | Status: DC | PRN
  Filled 2019-04-08: qty 5

## 2019-04-08 MED ORDER — OXYTOCIN 40 UNITS IN NORMAL SALINE INFUSION - SIMPLE MED
INTRAVENOUS | Status: AC
  Filled 2019-04-08: qty 1000

## 2019-04-08 MED ORDER — ZOLPIDEM TARTRATE 5 MG PO TABS: 5.0000 mg | ORAL_TABLET | Freq: Every evening | ORAL | Status: DC | PRN

## 2019-04-08 MED ORDER — LACTATED RINGERS IV SOLN: INTRAVENOUS | Status: DC

## 2019-04-08 MED ORDER — ACETAMINOPHEN 500 MG PO TABS
ORAL_TABLET | ORAL | Status: AC
  Filled 2019-04-08: qty 2

## 2019-04-08 MED ORDER — OXYCODONE-ACETAMINOPHEN 5-325 MG PO TABS
2.0000 | ORAL_TABLET | ORAL | Status: DC | PRN
  Administered 2019-04-09 (×2): 2 via ORAL
  Filled 2019-04-08 (×2): qty 2

## 2019-04-08 MED ORDER — GABAPENTIN 300 MG PO CAPS
ORAL_CAPSULE | ORAL | Status: AC
  Filled 2019-04-08: qty 1

## 2019-04-08 MED ORDER — KETOROLAC TROMETHAMINE 30 MG/ML IJ SOLN
30.0000 mg | Freq: Four times a day (QID) | INTRAMUSCULAR | Status: AC | PRN
  Administered 2019-04-08: 30 mg via INTRAVENOUS

## 2019-04-08 MED ORDER — MORPHINE SULFATE (PF) 0.5 MG/ML IJ SOLN: INTRAMUSCULAR | Status: DC | PRN

## 2019-04-08 MED ORDER — SODIUM CHLORIDE 0.9 % IV SOLN: INTRAVENOUS | Status: DC | PRN

## 2019-04-08 MED ORDER — PHENYLEPHRINE HCL-NACL 20-0.9 MG/250ML-% IV SOLN
INTRAVENOUS | Status: AC
  Filled 2019-04-08: qty 250

## 2019-04-08 MED ORDER — NALOXONE HCL 0.4 MG/ML IJ SOLN: 0.4000 mg | INTRAMUSCULAR | Status: DC | PRN

## 2019-04-08 MED ORDER — SOD CITRATE-CITRIC ACID 500-334 MG/5ML PO SOLN
30.0000 mL | ORAL | Status: AC
  Administered 2019-04-08: 30 mL via ORAL

## 2019-04-08 MED ORDER — BUPIVACAINE IN DEXTROSE 0.75-8.25 % IT SOLN
INTRATHECAL | Status: DC | PRN
  Administered 2019-04-08: 1.6 mL via INTRATHECAL

## 2019-04-08 MED ORDER — OXYTOCIN 40 UNITS IN NORMAL SALINE INFUSION - SIMPLE MED: 2.5000 [IU]/h | INTRAVENOUS | Status: AC

## 2019-04-08 MED ORDER — OXYCODONE HCL 5 MG/5ML PO SOLN: 5.0000 mg | Freq: Once | ORAL | Status: DC | PRN

## 2019-04-08 MED ORDER — PRENATAL MULTIVITAMIN CH: 1.0000 | ORAL_TABLET | Freq: Every day | ORAL | Status: DC

## 2019-04-08 MED ORDER — DIBUCAINE (PERIANAL) 1 % EX OINT: 1.0000 "application " | TOPICAL_OINTMENT | CUTANEOUS | Status: DC | PRN

## 2019-04-08 MED ORDER — ONDANSETRON HCL 4 MG/2ML IJ SOLN
INTRAMUSCULAR | Status: DC | PRN
  Administered 2019-04-08: 4 mg via INTRAVENOUS

## 2019-04-08 MED ORDER — MORPHINE SULFATE (PF) 0.5 MG/ML IJ SOLN
INTRAMUSCULAR | Status: AC
  Filled 2019-04-08: qty 10

## 2019-04-08 MED ORDER — KETOROLAC TROMETHAMINE 30 MG/ML IJ SOLN
30.0000 mg | Freq: Four times a day (QID) | INTRAMUSCULAR | Status: DC
  Administered 2019-04-08 (×2): 30 mg via INTRAVENOUS
  Filled 2019-04-08 (×3): qty 1

## 2019-04-08 MED ORDER — MENTHOL 3 MG MT LOZG: 1.0000 | LOZENGE | OROMUCOSAL | Status: DC | PRN

## 2019-04-08 MED ORDER — DIPHENHYDRAMINE HCL 50 MG/ML IJ SOLN: 12.5000 mg | INTRAMUSCULAR | Status: DC | PRN

## 2019-04-08 MED ORDER — MEPERIDINE HCL 25 MG/ML IJ SOLN: 6.2500 mg | INTRAMUSCULAR | Status: DC | PRN

## 2019-04-08 MED ORDER — SIMETHICONE 80 MG PO CHEW
80.0000 mg | CHEWABLE_TABLET | ORAL | Status: DC
  Administered 2019-04-08: 80 mg via ORAL
  Filled 2019-04-08: qty 1

## 2019-04-08 MED ORDER — GABAPENTIN 300 MG PO CAPS
300.0000 mg | ORAL_CAPSULE | ORAL | Status: AC
  Administered 2019-04-08: 300 mg via ORAL

## 2019-04-08 MED ORDER — OXYTOCIN 40 UNITS IN NORMAL SALINE INFUSION - SIMPLE MED
INTRAVENOUS | Status: DC | PRN
  Administered 2019-04-08: 40 [IU] via INTRAVENOUS

## 2019-04-08 MED ORDER — DIPHENHYDRAMINE HCL 25 MG PO CAPS: 25.0000 mg | ORAL_CAPSULE | ORAL | Status: DC | PRN

## 2019-04-08 MED ORDER — TRAMADOL HCL 50 MG PO TABS: 50.0000 mg | ORAL_TABLET | Freq: Four times a day (QID) | ORAL | Status: DC | PRN

## 2019-04-08 MED ORDER — SCOPOLAMINE 1 MG/3DAYS TD PT72
1.0000 | MEDICATED_PATCH | Freq: Once | TRANSDERMAL | Status: DC
  Administered 2019-04-08: 1.5 mg via TRANSDERMAL

## 2019-04-08 MED ORDER — ONDANSETRON HCL 4 MG/2ML IJ SOLN: 4.0000 mg | Freq: Three times a day (TID) | INTRAMUSCULAR | Status: DC | PRN

## 2019-04-08 MED ORDER — SODIUM CHLORIDE 0.9% FLUSH: 3.0000 mL | INTRAVENOUS | Status: DC | PRN

## 2019-04-08 MED ORDER — DIPHENHYDRAMINE HCL 25 MG PO CAPS: 25.0000 mg | ORAL_CAPSULE | Freq: Four times a day (QID) | ORAL | Status: DC | PRN

## 2019-04-08 MED ORDER — KETOROLAC TROMETHAMINE 30 MG/ML IJ SOLN
INTRAMUSCULAR | Status: AC
  Filled 2019-04-08: qty 1

## 2019-04-08 MED ORDER — MAGNESIUM HYDROXIDE 400 MG/5ML PO SUSP: 30.0000 mL | ORAL | Status: DC | PRN

## 2019-04-08 MED ORDER — ACETAMINOPHEN 500 MG PO TABS
1000.0000 mg | ORAL_TABLET | ORAL | Status: AC
  Administered 2019-04-08: 1000 mg via ORAL

## 2019-04-08 MED ORDER — NALBUPHINE HCL 10 MG/ML IJ SOLN: 5.0000 mg | Freq: Once | INTRAMUSCULAR | Status: DC | PRN

## 2019-04-08 MED ORDER — OXYCODONE HCL 5 MG PO TABS: 5.0000 mg | ORAL_TABLET | Freq: Once | ORAL | Status: DC | PRN

## 2019-04-08 MED ORDER — MORPHINE SULFATE (PF) 0.5 MG/ML IJ SOLN
INTRAMUSCULAR | Status: DC | PRN
  Administered 2019-04-08: .15 mg via INTRATHECAL

## 2019-04-08 MED ORDER — FENTANYL CITRATE (PF) 100 MCG/2ML IJ SOLN
INTRAMUSCULAR | Status: DC | PRN
  Administered 2019-04-08: 15 ug via INTRATHECAL

## 2019-04-08 MED ORDER — STERILE WATER FOR IRRIGATION IR SOLN
Status: DC | PRN
  Administered 2019-04-08: 1

## 2019-04-08 MED ORDER — SOD CITRATE-CITRIC ACID 500-334 MG/5ML PO SOLN
ORAL | Status: AC
  Filled 2019-04-08: qty 30

## 2019-04-08 MED ORDER — HYDROMORPHONE HCL 1 MG/ML IJ SOLN: 1.0000 mg | INTRAMUSCULAR | Status: DC | PRN

## 2019-04-08 MED ORDER — TETANUS-DIPHTH-ACELL PERTUSSIS 5-2.5-18.5 LF-MCG/0.5 IM SUSP: 0.5000 mL | Freq: Once | INTRAMUSCULAR | Status: DC

## 2019-04-08 MED ORDER — GABAPENTIN 300 MG PO CAPS
300.0000 mg | ORAL_CAPSULE | Freq: Two times a day (BID) | ORAL | Status: DC
  Filled 2019-04-08: qty 1

## 2019-04-08 MED ORDER — SIMETHICONE 80 MG PO CHEW
80.0000 mg | CHEWABLE_TABLET | ORAL | Status: DC | PRN
  Filled 2019-04-08: qty 1

## 2019-04-08 MED ORDER — FENTANYL CITRATE (PF) 100 MCG/2ML IJ SOLN: 25.0000 ug | INTRAMUSCULAR | Status: DC | PRN

## 2019-04-08 MED ORDER — FENTANYL CITRATE (PF) 100 MCG/2ML IJ SOLN
INTRAMUSCULAR | Status: AC
  Filled 2019-04-08: qty 2

## 2019-04-08 MED ORDER — KETOROLAC TROMETHAMINE 30 MG/ML IJ SOLN: 30.0000 mg | Freq: Four times a day (QID) | INTRAMUSCULAR | Status: AC | PRN

## 2019-04-08 MED ORDER — ONDANSETRON HCL 4 MG/2ML IJ SOLN
INTRAMUSCULAR | Status: AC
  Filled 2019-04-08: qty 2

## 2019-04-08 MED ORDER — ONDANSETRON HCL 4 MG/2ML IJ SOLN: 4.0000 mg | Freq: Four times a day (QID) | INTRAMUSCULAR | Status: DC | PRN

## 2019-04-08 SURGICAL SUPPLY — 40 items
APL SKNCLS STERI-STRIP NONHPOA (GAUZE/BANDAGES/DRESSINGS) ×2
BENZOIN TINCTURE PRP APPL 2/3 (GAUZE/BANDAGES/DRESSINGS) ×2 IMPLANT
CHLORAPREP W/TINT 26ML (MISCELLANEOUS) ×4 IMPLANT
CLAMP CORD UMBIL (MISCELLANEOUS) IMPLANT
CLOSURE STERI STRIP 1/2 X4 (GAUZE/BANDAGES/DRESSINGS) ×2 IMPLANT
CLOTH BEACON ORANGE TIMEOUT ST (SAFETY) ×4 IMPLANT
DRSG OPSITE POSTOP 4X10 (GAUZE/BANDAGES/DRESSINGS) ×4 IMPLANT
ELECT REM PT RETURN 9FT ADLT (ELECTROSURGICAL) ×4
ELECTRODE REM PT RTRN 9FT ADLT (ELECTROSURGICAL) ×2 IMPLANT
EXTRACTOR VACUUM M CUP 4 TUBE (SUCTIONS) IMPLANT
EXTRACTOR VACUUM M CUP 4' TUBE (SUCTIONS)
GLOVE BIOGEL PI IND STRL 7.0 (GLOVE) ×6 IMPLANT
GLOVE BIOGEL PI INDICATOR 7.0 (GLOVE) ×6
GLOVE ECLIPSE 7.0 STRL STRAW (GLOVE) ×4 IMPLANT
GOWN STRL REUS W/TWL LRG LVL3 (GOWN DISPOSABLE) ×8 IMPLANT
KIT ABG SYR 3ML LUER SLIP (SYRINGE) IMPLANT
NDL HYPO 25X5/8 SAFETYGLIDE (NEEDLE) ×2 IMPLANT
NEEDLE HYPO 22GX1.5 SAFETY (NEEDLE) ×4 IMPLANT
NEEDLE HYPO 25X5/8 SAFETYGLIDE (NEEDLE) ×4 IMPLANT
NS IRRIG 1000ML POUR BTL (IV SOLUTION) ×4 IMPLANT
PACK C SECTION WH (CUSTOM PROCEDURE TRAY) ×4 IMPLANT
PAD ABD 7.5X8 STRL (GAUZE/BANDAGES/DRESSINGS) ×4 IMPLANT
PAD ABD 8X10 STRL (GAUZE/BANDAGES/DRESSINGS) ×2 IMPLANT
PAD OB MATERNITY 4.3X12.25 (PERSONAL CARE ITEMS) ×4 IMPLANT
PENCIL SMOKE EVAC W/HOLSTER (ELECTROSURGICAL) ×4 IMPLANT
RETRACTOR WND ALEXIS 25 LRG (MISCELLANEOUS) IMPLANT
RTRCTR C-SECT PINK 25CM LRG (MISCELLANEOUS) IMPLANT
RTRCTR WOUND ALEXIS 25CM LRG (MISCELLANEOUS) ×4
SPONGE GAUZE 4X4 12PLY STER LF (GAUZE/BANDAGES/DRESSINGS) ×4 IMPLANT
SUT PDS AB 0 CTX 36 PDP370T (SUTURE) IMPLANT
SUT PLAIN 2 0 XLH (SUTURE) IMPLANT
SUT VIC AB 0 CTX 36 (SUTURE) ×8
SUT VIC AB 0 CTX36XBRD ANBCTRL (SUTURE) ×4 IMPLANT
SUT VIC AB 2-0 CT1 27 (SUTURE) ×4
SUT VIC AB 2-0 CT1 TAPERPNT 27 (SUTURE) IMPLANT
SUT VIC AB 4-0 KS 27 (SUTURE) ×4 IMPLANT
SYR CONTROL 10ML LL (SYRINGE) ×4 IMPLANT
TOWEL OR 17X24 6PK STRL BLUE (TOWEL DISPOSABLE) ×4 IMPLANT
TRAY FOLEY W/BAG SLVR 14FR LF (SET/KITS/TRAYS/PACK) ×4 IMPLANT
WATER STERILE IRR 1000ML POUR (IV SOLUTION) ×4 IMPLANT

## 2019-04-08 NOTE — Anesthesia Procedure Notes (Signed)
Spinal  Patient location during procedure: OR Start time: 04/08/2019 11:45 AM End time: 04/08/2019 11:50 AM Staffing Performed: anesthesiologist  Anesthesiologist: Achille Rich, MD Preanesthetic Checklist Completed: patient identified, IV checked, risks and benefits discussed, surgical consent, monitors and equipment checked, pre-op evaluation and timeout performed Spinal Block Patient position: sitting Prep: DuraPrep Patient monitoring: cardiac monitor, continuous pulse ox and blood pressure Approach: midline Location: L3-4 Injection technique: single-shot Needle Needle type: Pencan  Needle gauge: 24 G Needle length: 9 cm Assessment Sensory level: T10 Additional Notes Functioning IV was confirmed and monitors were applied. Sterile prep and drape, including hand hygiene and sterile gloves were used. The patient was positioned and the spine was prepped. The skin was anesthetized with lidocaine.  Free flow of clear CSF was obtained prior to injecting local anesthetic into the CSF.  The spinal needle aspirated freely following injection.  The needle was carefully withdrawn.  The patient tolerated the procedure well.

## 2019-04-08 NOTE — Anesthesia Preprocedure Evaluation (Signed)
Anesthesia Evaluation  Patient identified by MRN, date of birth, ID band Patient awake    Reviewed: Allergy & Precautions, H&P , NPO status , Patient's Chart, lab work & pertinent test results  Airway Mallampati: II   Neck ROM: full    Dental   Pulmonary neg pulmonary ROS,    breath sounds clear to auscultation       Cardiovascular hypertension,  Rhythm:regular Rate:Normal     Neuro/Psych PSYCHIATRIC DISORDERS Anxiety    GI/Hepatic   Endo/Other    Renal/GU      Musculoskeletal   Abdominal   Peds  Hematology   Anesthesia Other Findings   Reproductive/Obstetrics (+) Pregnancy                             Anesthesia Physical Anesthesia Plan  ASA: II  Anesthesia Plan: Spinal   Post-op Pain Management:    Induction: Intravenous  PONV Risk Score and Plan: 2 and Ondansetron and Treatment may vary due to age or medical condition  Airway Management Planned: Simple Face Mask  Additional Equipment:   Intra-op Plan:   Post-operative Plan:   Informed Consent: I have reviewed the patients History and Physical, chart, labs and discussed the procedure including the risks, benefits and alternatives for the proposed anesthesia with the patient or authorized representative who has indicated his/her understanding and acceptance.       Plan Discussed with: CRNA, Anesthesiologist and Surgeon  Anesthesia Plan Comments:         Anesthesia Quick Evaluation

## 2019-04-08 NOTE — Transfer of Care (Signed)
Immediate Anesthesia Transfer of Care Note  Patient: Brittney Holmes  Procedure(s) Performed: CESAREAN SECTION (N/A ) UNILATERAL SALPINGECTOMY (Right Abdomen)  Patient Location: PACU  Anesthesia Type:Spinal  Level of Consciousness: awake  Airway & Oxygen Therapy: Patient Spontanous Breathing  Post-op Assessment: Report given to RN  Post vital signs: Reviewed and stable  Last Vitals:  Vitals Value Taken Time  BP    Temp    Pulse 93 04/08/19 1300  Resp 31 04/08/19 1300  SpO2 100 % 04/08/19 1300  Vitals shown include unvalidated device data.  Last Pain:  Vitals:   04/08/19 1038  TempSrc: Oral         Complications: No apparent anesthesia complications

## 2019-04-08 NOTE — Op Note (Signed)
Scherry Ran PROCEDURE DATE: 04/08/2019  PREOPERATIVE DIAGNOSES: Intrauterine pregnancy at [redacted]w[redacted]d weeks gestation; previous uterine incision: cornual resection of ectopic pregnancy; undesired fertility  POSTOPERATIVE DIAGNOSES: The same  PROCEDURE: Primary Low Transverse Cesarean Section, Left Tubal Sterilization via Salpingectomy  SURGEON:  Dr. Verita Schneiders  ASSISTANT:  Dr. Barrington Ellison  ANESTHESIOLOGY TEAM: Anesthesiologist: Albertha Ghee, MD CRNA: [name], CRNA  INDICATIONS: Brittney Holmes is a 24 y.o. G3P1011 at [redacted]w[redacted]d here for cesarean section and tubal sterilization secondary to the indications listed under preoperative diagnoses; please see preoperative note for further details.  The risks of surgery were discussed with the patient including but were not limited to: bleeding which may require transfusion or reoperation; infection which may require antibiotics; injury to bowel, bladder, ureters or other surrounding organs; injury to the fetus; need for additional procedures including hysterectomy in the event of a life-threatening hemorrhage; placental abnormalities wth subsequent pregnancies, incisional problems, thromboembolic phenomenon and other postoperative/anesthesia complications.  Patient also desires permanent sterilization.  Other reversible forms of contraception were discussed with patient; she declines all other modalities. Risks of procedure discussed with patient including but not limited to: risk of regret, permanence of method, bleeding, infection, injury to surrounding organs and need for additional procedures.  Failure risk of 1-2% with increased risk of ectopic gestation if pregnancy occurs was also discussed with patient.  Also discussed possibility of post-tubal pain syndrome. The patient concurred with the proposed plan, giving informed written consent for the procedures.    FINDINGS:  Viable female infant in cephalic presentation.  Apgars 9 and 9.  Clear amniotic  fluid.  Intact placenta, three vessel cord.  Normal uterus and ovaries bilaterally. Surgically absent right fallopian tube, well-healed right cornual region.  Distal end and fimbria of left fallopian was noted to be adherent to bowel; these were taken down gently, and left fallopian tube was excised.    ANESTHESIA: Spinal ESTIMATED BLOOD LOSS: 950 ml SPECIMENS: Placenta sent to L&D and left fallopian tube fragments also sent to pathology COMPLICATIONS: None immediate  PROCEDURE IN DETAIL:  The patient preoperatively received intravenous antibiotics and had sequential compression devices applied to her lower extremities.   She was then taken to the operating room where spinal anesthesia was administered and was found to be adequate. She was then placed in a dorsal supine position with a leftward tilt, and prepped and draped in a sterile manner.  A foley catheter was placed into her bladder and attached to constant gravity.  After an adequate timeout was performed, a Pfannenstiel skin incision was made with scalpel over her preexisting scar and carried through to the underlying layer of fascia. The fascia was incised in the midline, and this incision was extended bilaterally using the Mayo scissors.  Kocher clamps were applied to the superior aspect of the fascial incision and the underlying rectus muscles were dissected off bluntly.  A similar process was carried out on the inferior aspect of the fascial incision. The rectus muscles were separated in the midline and the peritoneum was entered bluntly. The Alexis self-retaining retractor was introduced into the abdominal cavity.  Attention was turned to the lower uterine segment where a low transverse hysterotomy was made with a scalpel and extended bilaterally bluntly.  The infant was successfully delivered, the cord was clamped and cut after one minute, and the infant was handed over to the awaiting neonatology team. Uterine massage was then administered, and  the placenta delivered intact with a three-vessel cord. The uterus was  then cleared of clots and debris.  The hysterotomy was closed with 0 Vicryl in a running locked fashion, and an imbricating layer was also placed with 0 Vicryl.  Figure-of-eight 0 Vicryl serosal stitches were placed to help with hemostasis.  Attention was then turned to the left fallopian tubes noted to be adherent to bowel and omentum. This adhesion was clamped, cut and tied off sequentially using 2-0 Vicryl.   This freed up the left fallopian tube which was clamped in segments, cut and suture ligated using 2-0 Vicryl. This allowed for left tubal sterilization.  Good hemostasis was noted. The pelvis was cleared of all clot and debris. Hemostasis was confirmed on all surfaces.  The retractor was removed.  The peritoneum was closed with a 0 Vicryl running stitch. The fascia was then closed using 0 PDS in a running fashion.  The subcutaneous layer was irrigated, and the skin was closed with a 4-0 Vicryl subcuticular stitch. The patient tolerated the procedure well. Sponge, instrument and needle counts were correct x 3.  She was taken to the recovery room in stable condition.    Jaynie Collins, MD, FACOG Obstetrician & Gynecologist, [facility] for [employer], [facility]

## 2019-04-08 NOTE — Discharge Summary (Signed)
Postpartum Discharge Summary     Patient Name: Brittney Holmes DOB: 11-Sep-1995 MRN: [id number]  Date of admission: 04/08/2019 Delivering Provider: Chauncey Mann   Date of discharge: 04/10/2019  Admitting diagnosis: Status post cesarean section [Z98.891] Intrauterine pregnancy: [redacted]w[redacted]d    Secondary diagnosis:  Principal Problem:   Status post cesarean section and left salpingectomy Active Problems:   History of pre-eclampsia in prior pregnancy, currently pregnant   GBS (group B Streptococcus carrier), +RV culture, currently pregnant   History of salpingoophorectomy  Additional problems: None     Discharge diagnosis: Term Pregnancy Delivered                                                                                                Post partum procedures:None  Augmentation: NA  Complications: None  Hospital course:  Sceduled C/S   24y.o. yo G3P1011 at 321w1das admitted to the hospital 04/08/2019 for scheduled cesarean section with the following indication:Prior Uterine Surgery.  Membrane Rupture Time/Date: 12:09 PM ,04/08/2019   Patient delivered a Viable infant.04/08/2019  Details of operation can be found in separate operative note. Left salpingectomy done intra-operatively. Pateint had an uncomplicated postpartum course.  She is ambulating, tolerating a regular diet, passing flatus, and urinating well. Patient is discharged home in stable condition on  04/10/19        Delivery time: 12:10 PM    Magnesium Sulfate received: No BMZ received: No Rhophylac:No MMR:No Transfusion:No  Physical exam  Vitals:   04/09/19 1000 04/09/19 1600 04/09/19 2122 04/10/19 0527  BP: (!) 118/91 99/68 104/73 (!) 116/92  Pulse: 92 88 90 91  Resp: _0 Temp: 98.4 F (36.9 C) 98.6 F (37 C) 98 F (36.7 C) 98.7 F (37.1 C)  TempSrc: Axillary Axillary Oral Oral  SpO2: 99% 98% 99%   Weight:      Height:       General: alert, cooperative and no distress Lochia:  appropriate Uterine Fundus: firm, U/-1 Incision: Dressing is clean, dry, and intact DVT Evaluation: No evidence of DVT seen on physical exam. No significant calf/ankle edema. Labs: Lab Results  Component Value Date   WBC 18.5 (H) 04/09/2019   HGB 8.5 (L) 04/09/2019   HCT 27.4 (L) 04/09/2019   MCV 82.0 04/09/2019   PLT 377 04/09/2019   CMP Latest Ref Rng & Units 04/09/2019  Glucose 70 - 99 mg/dL 114(H)  BUN 6 - 20 mg/dL <5(L)  Creatinine 0.44 - 1.00 mg/dL 0.56  Sodium 135 - 145 mmol/L 132(L)  Potassium 3.5 - 5.1 mmol/L 4.0  Chloride 98 - 111 mmol/L 101  CO2 22 - 32 mmol/L 23  Calcium 8.9 - 10.3 mg/dL 8.9  Total Protein 6.5 - 8.1 g/dL 5.3(L)  Total Bilirubin 0.3 - 1.2 mg/dL 0.4  Alkaline Phos 38 - 126 U/L 93  AST 15 - 41 U/L 33  ALT 0 - 44 U/L 14    Discharge instruction: per After Visit Summary and "Baby and Me Booklet". Remove steri-strips in 7-10 days.  Incision care guidelines: how to clean, when to call, and  anticipated healing.    After visit meds:  Allergies as of 04/10/2019      Reactions   Feraheme [ferumoxytol]    Light headed, shakey, nausea, short of breath      Medication List    STOP taking these medications   aspirin EC 81 MG tablet   Prenatal Vitamin Plus Low Iron 27-1 MG Tabs     TAKE these medications   Blood Pressure Kit Devi 1 Device by Does not apply route as needed.   ferrous sulfate 220 (44 Fe) MG/5ML solution Commonly known as: Iron Supplement Take 5 mLs (220 mg total) by mouth daily.   ibuprofen 100 MG/5ML suspension Commonly known as: ADVIL Take 30 mLs (600 mg total) by mouth every 6 (six) hours as needed.   oxyCODONE 5 MG/5ML solution Commonly known as: ROXICODONE Take 5-10 mLs (5-10 mg total) by mouth every 4 (four) hours as needed for moderate pain.       Diet: No evidence of DVT seen on physical exam. No significant calf/ankle edema.  Activity: Advance as tolerated. Pelvic rest for 6 weeks.   Outpatient follow up:4  weeks Follow up Appt: Future Appointments  Date Time Provider [place]  04/19/2019 10:45 AM [facility] [place]  04/23/2019 10:20 AM [facility] [place]  05/07/2019  3:35 PM Fair, Marin Shutter, MD [facility]   Follow up Visit:    Please schedule this patient for Postpartum visit in: 4 weeks with the following provider: Any provider Virtual For C/S patients schedule nurse incision check in weeks 2 weeks: yes Low risk pregnancy complicated by: prior uterine surgery Delivery mode:  CS Anticipated Birth Control:  salpingectomy PP Procedures needed: Incision check  Schedule Integrated BH visit: no     Newborn Data: Live born female-Teavin Birth Weight: 6lbs 7 oz APGAR: 9, 9  Newborn Delivery   Birth date/time: 04/08/2019 12:10:00 Delivery type: C-Section, Low Transverse Trial of labor: No C-section categorization: Primary      Baby Feeding: Breast Disposition:home with mother   04/10/2019 Maryann Conners, CNM

## 2019-04-08 NOTE — Lactation Note (Signed)
This note was copied from a baby's chart. Lactation Consultation Note Baby 8 hrs old. Mom is breast/formula right now, will switch to total formula once she leaves the hospital. Mom has everted nipples. Mom stated she had just finished BF. Baby laying in cradle position on breast. Mom stated he may wants more I'll put him back on. Baby latched w/one cheeks not touching breast pulling away from breast. Encouraged mom cheeks to breast to obtain deep latch and not pull on nipple and make nipple sore or cause damage.  Gave mom pillow for support. Discussed using support, breast massage, cheeks to breast, STS, I&O, giving breast first, newborn behavior, supply and demand. Mom encouraged to feed baby 8-12 times/24 hours and with feeding cues.  Mom very polite and appreciative for assistance. Discussed once home if chooses not to BF how to manage engorgement.  Lactation brochure given.  Patient Name: Brittney Holmes [id number] Date: 04/08/2019 Reason for consult: Initial assessment;Primapara;Early term 37-38.6wks   Maternal Data Has patient been taught Hand Expression?: Yes Does the patient have breastfeeding experience prior to this delivery?: No  Feeding Feeding Type: Breast Fed  LATCH Score Latch: Grasps breast easily, tongue down, lips flanged, rhythmical sucking.  Audible Swallowing: A few with stimulation  Type of Nipple: Everted at rest and after stimulation  Comfort (Breast/Nipple): Soft / non-tender  Hold (Positioning): Assistance needed to correctly position infant at breast and maintain latch.  LATCH Score: 8  Interventions Interventions: Breast feeding basics reviewed;Support pillows;Assisted with latch;Position options;Skin to skin;Breast massage;Hand express;Breast compression;Adjust position  Lactation Tools Discussed/Used WIC Program: Yes   Consult Status Consult Status: Follow-up Date: 04/09/19 Follow-up type: In-patient    Charyl Dancer 04/08/2019, 8:38  PM

## 2019-04-08 NOTE — H&P (Signed)
Obstetric Preoperative History and Physical  Brittney Holmes is a 24 y.o. G3P1011 with IUP at 64w1dpresenting for scheduled cesarean section.  Reports good fetal movement, no bleeding, no contractions, no leaking of fluid.  No acute preoperative concerns.    Cesarean Section Indication: prior uterine surgery, had wedge resection of right cornual ectopic pregnancy  Prenatal Course Source of Care: [facility] Pregnancy complications or risks: Patient Active Problem List   Diagnosis Date Noted  . History of salpingoophorectomy 04/08/2019  . GBS (group B Streptococcus carrier), +RV culture, currently pregnant 04/05/2019  . Preterm contractions 03/07/2019  . Microcytic anemia 02/27/2019  . History of pre-eclampsia in prior pregnancy, currently pregnant 01/23/2019  . Supervision of high risk pregnancy, antepartum 01/03/2019  . At high risk for complications of intrauterine pregnancy (IUP) 09/06/2018  . Constipation 11/09/2016  . Anxiety 11/09/2016   She plans to bottle feed She desires left salpingectomy for postpartum contraception.   Prenatal labs and studies: ABO, Rh: --/--/O POS [facility]formed at [facility] [address], [place] [address] ([phone] Antibody: NEG (01/07 2308) Rubella: 2.03 (12/15 1633) RPR: NON REACTIVE (01/07 2308)  HBsAg: Negative (12/15 1633)  HIV: NON REACTIVE (01/07 1841)  GQMG:QQPYPPJK/- (01/29 1144) 2 hr Glucola  WNL Genetic screening normal Anatomy UKoreanormal  Prenatal Transfer Tool  Maternal Diabetes: No Genetic Screening: Normal Maternal Ultrasounds/Referrals: Normal Fetal Ultrasounds or other Referrals:  None Maternal Substance Abuse:  No Significant Maternal Medications:  None Significant Maternal Lab Results: Group B Strep positive  Past Medical History:  Diagnosis Date  . Chlamydia infection 05/31/2013  . Cornual ectopic pregnancy 06/18/2016   s/p ELAP, R cornual resection, salpingectomy '[ ]'  Needs serial HCGs until undetectable  Needs cesarean deliveries at 36-37 weeks for subsequent pregnancies.  . 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 Hemoperitoneum due to rupture of right cornual ectopic pregnancy 06/18/2016  . Mild preeclampsia 10/07/2012  . Trichomonas infection 02/14/2014    Past Surgical History:  Procedure Laterality Date  . LAPAROTOMY  06/18/2016   Procedure: EXPLORATORY LAPAROTOMY, RIGHT CORNEAL WEDGE RESECTION,RIGHT SALPINGECTOMY;  Surgeon: UOsborne Oman MD;  [facility];  Service: Gynecology;;  . SALPINGOOPHORECTOMY     rt. per pt    OB History  Gravida Para Term Preterm AB Living  '3 1 1 ' 0 1 1  SAB TAB Ectopic Multiple Live Births  0 0 1 0 1    # Outcome Date GA Lbr Len/2nd Weight Sex Delivery Anes PTL Lv  3 Current           2 Ectopic 06/18/16 830w6d  ECTOPIC     1 Term 10/06/12 3865w0d:42 / 01:18 2262 g F Vag-Spont EPI  LIV     Birth Comments: baby sga, preeclampsia    Social History   Socioeconomic History  . Marital status: Single    Spouse name: Not on file  . Number of children: Not on file  . Years of education: Not on file  . Highest education level: Not on file  Occupational History  . Not on file  Tobacco Use  . Smoking status: Never Smoker  . Smokeless tobacco: Never Used  Substance and Sexual Activity  . Alcohol use: No  . Drug use: No  . Sexual activity: Yes    Birth control/protection: None  Other Topics Concern  . Not on file  Social History Narrative  . Not on file   Social Determinants of Health  Financial Resource Strain:   . Difficulty of Paying Living Expenses: Not on file  Food Insecurity: Food Insecurity Present  . Worried About Charity fundraiser in the Last Year: Sometimes true  . Ran Out of Food in the Last Year: Sometimes true  Transportation Needs: Unmet Transportation Needs  . Lack of Transportation (Medical): Yes  . Lack of Transportation (Non-Medical): No  Physical Activity:   . Days of Exercise per Week:  Not on file  . Minutes of Exercise per Session: Not on file  Stress:   . Feeling of Stress : Not on file  Social Connections:   . Frequency of Communication with Friends and Family: Not on file  . Frequency of Social Gatherings with Friends and Family: Not on file  . Attends Religious Services: Not on file  . Active Member of Clubs or Organizations: Not on file  . Attends Archivist Meetings: Not on file  . Marital Status: Not on file    Family History  Problem Relation Age of Onset  . Cancer Maternal Grandmother        lung    Medications Prior to Admission  Medication Sig Dispense Refill Last Dose  . aspirin EC 81 MG tablet Take 81 mg by mouth daily.   04/07/2019 at Unknown time  . Prenatal Vit-Fe Fumarate-FA (PRENATAL VITAMIN PLUS LOW IRON) 27-1 MG TABS Take 1 tablet by mouth daily. 30 tablet 10 Past Week at Unknown time  . Blood Pressure Monitoring (BLOOD PRESSURE KIT) DEVI 1 Device by Does not apply route as needed. 1 each 0   . ferrous sulfate (IRON SUPPLEMENT) 220 (44 Fe) MG/5ML solution Take 5 mLs (220 mg total) by mouth daily. (Patient not taking: Reported on 03/29/2019) 150 mL 3     Allergies  Allergen Reactions  . Feraheme [Ferumoxytol]     Light headed, shakey, nausea, short of breath    Review of Systems: Pertinent items noted in HPI and remainder of comprehensive ROS otherwise negative.  Physical Exam: BP (!) 127/94   Pulse (!) 120   Temp 98.2 F (36.8 C) (Oral)   Resp 18   Ht '5\' 4"'  (1.626 m)   Wt 54 kg   LMP 07/28/2018   SpO2 98%   BMI 20.43 kg/m  FHR by Doppler: 172 bpm CONSTITUTIONAL: Well-developed, well-nourished female in no acute distress.  HENT:  Normocephalic, atraumatic, External right and left ear normal.  EYES: Conjunctivae and EOM are normal. No scleral icterus.  NECK: Normal range of motion, supple, no masses SKIN: Skin is warm and dry. No rash noted. Not diaphoretic. No erythema. No pallor. [place]: Alert and oriented to  person, place, and time. Normal reflexes, muscle tone coordination. No cranial nerve deficit noted. PSYCHIATRIC: Normal mood and affect. Normal behavior. Normal judgment and thought content. CARDIOVASCULAR: Normal heart rate noted RESPIRATORY: Effort and breath sounds normal, no problems with respiration noted ABDOMEN: Soft, nontender, nondistended, gravid.  PELVIC: Deferred MUSCULOSKELETAL: Normal range of motion. No edema and no tenderness. 2+ distal pulses.   Pertinent Labs/Studies:   Results for orders placed or performed during the hospital encounter of 04/06/19 (from the past 72 hour(s))  SARS CORONAVIRUS 2 (TAT 6-24 HRS) Nasopharyngeal Nasopharyngeal Swab     Status: None   Collection Time: 04/06/19  9:11 AM   Specimen: Nasopharyngeal Swab  Result Value Ref Range   SARS Coronavirus 2 NEGATIVE NEGATIVE    Comment: (NOTE) SARS-CoV-2 target nucleic acids are NOT DETECTED. The SARS-CoV-2 RNA is  generally detectable in upper and lower respiratory specimens during the acute phase of infection. Negative results do not preclude SARS-CoV-2 infection, do not rule out co-infections with other pathogens, and should not be used as the sole basis for treatment or other patient management decisions. Negative results must be combined with clinical observations, patient history, and epidemiological information. The expected result is Negative. Fact Sheet for Patients: SugarRoll.be Fact Sheet for Healthcare Providers: https://www.woods-mathews.com/ This test is not yet approved or cleared by the [place] FDA and  has been authorized for detection and/or diagnosis of SARS-CoV-2 by FDA under an Emergency Use Authorization (EUA). This EUA will remain  in effect (meaning this test can be used) for the duration of the COVID-19 declaration under Section 56 4(b)(1) of the Act, 21 U.S.C. section 360bbb-3(b)(1), unless the authorization is terminated  or revoked sooner. Performed at [facility], [place] [address] [place], [place] [address]     Assessment and Plan: Brianca Fortenberry is a 24 y.o. G3P1011 at 45w1dbeing admitted for scheduled cesarean section. The risks of cesarean section discussed with the patient included but were not limited to: bleeding which may require transfusion or reoperation; infection which may require antibiotics; injury to bowel, bladder, ureters or other surrounding organs; injury to the fetus; need for additional procedures including hysterectomy in the event of a life-threatening hemorrhage; placental abnormalities with subsequent pregnancies, incisional problems, thromboembolic phenomenon and other postoperative/anesthesia complications. The patient concurred with the proposed plan, giving informed written consent for the procedure. Patient has been NPO since last night she will remain NPO for procedure. Anesthesia and OR aware. Preoperative prophylactic antibiotics and SCDs ordered on call to the OR.  Patient desires permanent sterilization.  Other reversible forms of contraception were discussed with patient; she declines all other modalities. Risks of procedure discussed with patient including but not limited to: risk of regret, permanence of method, bleeding, infection, injury to surrounding organs and need for additional procedures. Desires salpingectomy.  Also discussed possibility of post-tubal pain syndrome. Patient verbalized understanding of these risks and wants to proceed with sterilization.  Written informed consent obtained.  To OR when ready.  Pregnancy Complications: Hx of Pre-E, will monitor BP's closely. GBS pos. Contraception: Salpingectomy (hx of salpingoophorectomy for ectopic) Circumcision: outpatient MOF: Bottle   CBarrington Ellison MD OB Family Medicine Fellow, [facility] [place]for [facility] [place]of Attending Supervision of Advanced Practice  Provider (PA/CNM/NP): Evaluation and management procedures were performed by the Advanced Practice Provider under my supervision and collaboration.  I have reviewed the Advanced Practice Provider's note and chart, and I agree with the management and plan as written above.  Patient was seen and examined. Re-reviewed risks of surgery and also had her confirm she wanted permanent sterilization.  Consent signed. To OR when ready.   UVerita Schneiders MD, FLake MiltonAttending [place] FPrisma Health [facility] Hospitalfor WDean Foods Company CDrytown

## 2019-04-09 LAB — COMPREHENSIVE METABOLIC PANEL
ALT: 14 U/L (ref 0–44)
AST: 33 U/L (ref 15–41)
Albumin: 2.3 g/dL — ABNORMAL LOW (ref 3.5–5.0)
Alkaline Phosphatase: 93 U/L (ref 38–126)
Anion gap: 8 (ref 5–15)
BUN: <5 mg/dL — ABNORMAL LOW (ref 6–20)
CO2: 23 mmol/L (ref 22–32)
Calcium: 8.9 mg/dL (ref 8.9–10.3)
Chloride: 101 mmol/L (ref 98–111)
Creatinine, Ser: 0.56 mg/dL (ref 0.44–1.00)
GFR calc Af Amer: >60 mL/min (ref >60)
GFR calc non Af Amer: >60 mL/min (ref >60)
Glucose, Bld: 114 mg/dL — ABNORMAL HIGH (ref 70–99)
Potassium: 4 mmol/L (ref 3.5–5.1)
Sodium: 132 mmol/L — ABNORMAL LOW (ref 135–145)
Total Bilirubin: 0.4 mg/dL (ref 0.3–1.2)
Total Protein: 5.3 g/dL — ABNORMAL LOW (ref 6.5–8.1)

## 2019-04-09 LAB — CBC
HCT: 27.4 % — ABNORMAL LOW (ref 36.0–46.0)
Hemoglobin: 8.5 g/dL — ABNORMAL LOW (ref 12.0–15.0)
MCH: 25.4 pg — ABNORMAL LOW (ref 26.0–34.0)
MCHC: 31 g/dL (ref 30.0–36.0)
MCV: 82 fL (ref 80.0–100.0)
Platelets: 377 10*3/uL (ref 150–400)
RBC: 3.34 MIL/uL — ABNORMAL LOW (ref 3.87–5.11)
RDW: 24 % — ABNORMAL HIGH (ref 11.5–15.5)
WBC: 18.5 10*3/uL — ABNORMAL HIGH (ref 4.0–10.5)
nRBC: 0 % (ref 0.0–0.2)

## 2019-04-09 LAB — SURGICAL PATHOLOGY

## 2019-04-09 MED ORDER — GABAPENTIN 250 MG/5ML PO SOLN
300.0000 mg | Freq: Two times a day (BID) | ORAL | Status: DC
  Administered 2019-04-09: 300 mg via ORAL
  Filled 2019-04-09 (×4): qty 6

## 2019-04-09 MED ORDER — COMPLETENATE 29-1 MG PO CHEW
1.0000 | CHEWABLE_TABLET | Freq: Every day | ORAL | Status: DC
  Filled 2019-04-09 (×2): qty 1

## 2019-04-09 MED ORDER — KETOROLAC TROMETHAMINE 30 MG/ML IJ SOLN: 30.0000 mg | Freq: Four times a day (QID) | INTRAMUSCULAR | Status: AC

## 2019-04-09 MED ORDER — OXYCODONE HCL 5 MG/5ML PO SOLN: 5.0000 mg | ORAL | Status: DC | PRN

## 2019-04-09 MED ORDER — OXYTOCIN 40 UNITS IN NORMAL SALINE INFUSION - SIMPLE MED
INTRAVENOUS | Status: AC
  Filled 2019-04-09: qty 2000

## 2019-04-09 MED ORDER — IBUPROFEN 100 MG/5ML PO SUSP
600.0000 mg | Freq: Four times a day (QID) | ORAL | Status: DC
  Administered 2019-04-09 – 2019-04-10 (×4): 600 mg via ORAL
  Filled 2019-04-09 (×5): qty 30

## 2019-04-09 MED ORDER — SENNOSIDES 8.8 MG/5ML PO SYRP
10.0000 mL | ORAL_SOLUTION | Freq: Every day | ORAL | Status: DC
  Administered 2019-04-09: 10 mL via ORAL
  Filled 2019-04-09 (×3): qty 10

## 2019-04-09 MED ORDER — SIMETHICONE 40 MG/0.6ML PO SUSP
80.0000 mg | ORAL | Status: DC
  Administered 2019-04-09: 80 mg via ORAL
  Filled 2019-04-09 (×2): qty 1.2

## 2019-04-09 MED ORDER — DOCUSATE SODIUM 50 MG/5ML PO LIQD
100.0000 mg | Freq: Every day | ORAL | Status: DC
  Administered 2019-04-09: 100 mg via ORAL
  Filled 2019-04-09 (×2): qty 10

## 2019-04-09 MED ORDER — FERROUS SULFATE 300 (60 FE) MG/5ML PO SYRP
300.0000 mg | ORAL_SOLUTION | Freq: Two times a day (BID) | ORAL | Status: DC
  Administered 2019-04-10: 300 mg via ORAL
  Filled 2019-04-09 (×4): qty 5

## 2019-04-09 NOTE — Lactation Note (Addendum)
This note was copied from a baby's chart. Lactation Consultation Note  Patient Name: Brittney Holmes [id number] Date: 04/09/2019 Reason for consult: Follow-up assessment;Early term 38-38.6wks  Baby is 27 hours old  As LC entered the room , student and instructor finishing up 24 hour  Checks on baby. Baby awake and rooting. LC updated the  Doc flow  Sheets per mom and baby last fed at 920 am for 30 mins.  LC assisted mom to latch on the left breast, cross cradle, and worked on  Depth, increased swallows noted with breast compressions and per mom comfortable. Baby fed for 10 mins and released on his own, nipple well rounded.  Baby still hungry and LC assisted mom to use the football position ( per mom not familiar). Baby latched easily and still feeding with multiple swallows.  LC reviewed the benefits of STS feedings and in between.  Discussed nutritive vs non - nutritive feeding patterns and watching the baby for  Hanging out latched.  LC reviewed the latching basics, importance of firm support and waiting for the baby to open wide and with breast compressions.  Per mom comfortable with both latches.  LC encouraged mom to give the baby practice at latching and praised her for her efforts breast feeding. LC reminded mom to feed with feeding cues and if the baby is not showing feeding cues and its been awhile to check the diaper , change if needed,  Place baby STS, hand express , spoon feed EBM for appetizer if needed and the baby probably will want to feed.  Mom mentioned he is latching so much better than her 1st baby did.     Maternal Data Has patient been taught Hand Expression?: Yes  Feeding Feeding Type: Breast Fed  LATCH Score Latch: Grasps breast easily, tongue down, lips flanged, rhythmical sucking.  Audible Swallowing: Spontaneous and intermittent  Type of Nipple: Everted at rest and after stimulation  Comfort (Breast/Nipple): Filling, red/small blisters or bruises, mild/mod  discomfort  Hold (Positioning): Assistance needed to correctly position infant at breast and maintain latch.  LATCH Score: 8  Interventions Interventions: Breast feeding basics reviewed;Assisted with latch;Skin to skin;Breast massage;Breast compression;Adjust position;Support pillows;Position options  Lactation Tools Discussed/Used     Consult Status Consult Status: Follow-up Date: 04/10/19 Follow-up type: In-patient    [name] Brittney Holmes 04/09/2019, 12:35 PM

## 2019-04-09 NOTE — Clinical Social Work Maternal (Signed)
CLINICAL SOCIAL WORK MATERNAL/CHILD NOTE  Patient Details  Name: Brittney Holmes MRN: [id number] Date of Birth: 1995/08/07  Date:  04/09/2019  Clinical Social Worker Initiating Note:  Brittney Holmes Date/Time: Initiated:  04/09/19/1122     Child's Name:  Brittney Holmes   Biological Parents:  Mother, Brittney Holmes and Brittney Holmes DOB: 02/01/1989)   Need for Interpreter:  None   Reason for Referral:  Behavioral Health Concerns, Other (Comment)(Food insecurity and [facility])   Address:  [address] [place] [address]    Phone number:  [phone] (home)     Additional phone number:   Household Members/Support Persons (HM/SP):   Household Member/Support Person 1   HM/SP Name Relationship DOB or Age  HM/SP -1 Brittney Holmes Daughter 10/06/2012  HM/SP -2        HM/SP -3        HM/SP -4        HM/SP -5        HM/SP -6        HM/SP -7        HM/SP -8          Natural Supports (not living in the home):  Parent, Immediate Family   Professional Supports: None   Employment: Unemployed   Type of Work:     Education:  Programmer, systems   Homebound arranged:    Museum/gallery curator Resources:  Medicaid   Other Resources:  Physicist, medical  (Intends to apply for [facility])   Cultural/Religious Considerations Which May Impact Care:    Strengths:  Ability to meet basic needs  , Home prepared for child  , Pediatrician chosen   Psychotropic Medications:         Pediatrician:    [place]  Pediatrician List:   [place]      Pediatrician Fax Number:    Risk Factors/Current Problems:  Mental Health Concerns     Cognitive State:  Able to Concentrate  , Alert  , Linear Thinking     Mood/Affect:  Calm  , Comfortable  , Bright  , Interested  , Happy  , Relaxed     CSW Assessment:  CSW received consult for history of anxiety and  depression, LPNC and noted food insecurity.  CSW met with MOB to offer support and complete assessment.    MOB sitting up in bed with infant asleep in bassinet and MGM present at bedside, when CSW entered the room. MOB welcoming of CSW visit and gave CSW verbal permission to complete assessment with MGM present. MOB and MGM both very pleasant and engaged throughout assessment. CSW explained reason for consult to which MOB expressed understanding. MOB reported she currently lives in [place] with her 26-year-old. MGM shared she and her other daughters live in the same apartment complex and that MGM is around often to help and offer support. MOB confirmed she and her children receive food stamps through her mother and that she intends to apply for [facility]. CSW inquired about MOB's mental health history and MOB acknowledged a history of anxiety. MGM shared MOB tends to over think things and believes her anxiety stems from ectopic pregnancy that MOB experienced. MOB shared increased anxiety during her pregnancy but that she is able to think positive and symptoms go away. MOB also reported having a good support  system consisting of her mother and sisters. CSW inquired about MOB's interest in medications or counseling but MOB declined, at this time. MOB denied any PPD with previous pregnancy but was receptive to education. CSW provided education regarding the baby blues period vs. perinatal mood disorders, discussed treatment and gave resources for mental health follow up if concerns arise. CSW recommended self-evaluation during the postpartum time period using the New Mom Checklist from Postpartum Progress and encouraged MOB to contact a medical professional if symptoms are noted at any time. MOB did not appear to be displaying any acute mental health symptoms and denied any current SI, HI or DV. MOB confirmed having all essential items for infant once discharged and stated infant would be sleeping in a bassinet once  home. CSW provided review of Sudden Infant Death Syndrome (SIDS) precautions and safe sleeping habits. CSW addressed noted food insecurity in [facility] records to which MOB seemed surprised. MOB confirmed she has adequate food in the home and MGM shared she gets food stamps and that they are aware of resources, if needed.   CSW aware consults mention [facility] to which CSW addressed this with MOB. MOB reported she attended all of her ultrasounds as she was not aware she was also supposed to be going to appointments. Per chart review, it looks like MOB's first appointment with [facility] was at 26 weeks but MOB visited with MFM at 14 weeks. MOB and MGM denied any barriers to getting infant to his follow up appointments.     CSW Plan/Description:  No Further Intervention Required/No Barriers to Discharge, Sudden Infant Death Syndrome (SIDS) Education, Perinatal Mood and Anxiety Disorder (PMADs) Education    Woody, LCSW 04/09/2019, 11:50 AM

## 2019-04-09 NOTE — Progress Notes (Signed)
POSTPARTUM PROGRESS NOTE  Subjective: Brittney Holmes is a 24 y.o. [id number] on POD#1 s/p pLTCS 2/2 prior uterine surgery (wedge resection of right cornual ectopic pregnancy) at [redacted]w[redacted]d.  She reports she doing well. No acute events overnight. She denies any problems with ambulating or po intake. Denies nausea or vomiting. She has not voided (her Foley was just removed) or passed flatus. Pain is well controlled.  Lochia is apopropriate. She does report a headache, but denies vision changes or RUQ pain. She also reports that she has not slept much over night.  Objective: Blood pressure 99/66, pulse 79, temperature 98.9 F (37.2 C), temperature source Oral, resp. rate 16, height 5\' 4"  (1.626 m), weight 54 kg, last menstrual period 07/28/2018, SpO2 99 %, unknown if currently breastfeeding.  Physical Exam:  General: alert, cooperative and no distress Chest: no respiratory distress Abdomen: soft, non-tender  Uterine Fundus: firm, appropriately tender Extremities: No calf swelling or tenderness  No edema  Recent Labs    04/08/19 1029 04/09/19 0656  HGB 10.5* 8.5*  HCT 34.6* 27.4*    Assessment/Plan: Brittney Holmes is a 24 y.o. 22 on POD#1 s/p pLTCS 2/2 prior uterine surgery (wedge resection of right cornual ectopic pregnancy) at [redacted]w[redacted]d  Routine Postpartum Care: Doing well, pain well-controlled.  -- Continue routine care, lactation support  -- Contraception: s/p salpingectomy -- Feeding: bottle -- Headache: will try tylenol, has not slept over night. Encouraged PO fluids (BP not elevated). If persists, may need to work up for post partum Pre-E.  Dispo: Plan for discharge tomorrow or POD#3.  [redacted]w[redacted]d, DO OB/GYN Fellow, [facility] for [facility]

## 2019-04-10 MED ORDER — IBUPROFEN 100 MG/5ML PO SUSP: 600.0000 mg | Freq: Four times a day (QID) | ORAL | 1 refills | Status: DC | PRN

## 2019-04-10 MED ORDER — OXYCODONE HCL 5 MG/5ML PO SOLN: 5.0000 mg | ORAL | 0 refills | Status: DC | PRN

## 2019-04-10 NOTE — Lactation Note (Signed)
This note was copied from a baby's chart. Lactation Consultation Note  Patient Name: Brittney Holmes [id number] Date: 04/10/2019 Reason for consult: Follow-up assessment;Infant weight loss;Early term 20-38.6wks Baby is 54 hours old  Per mom baby recently fed, breast are fuller and nipples are sensitive.  LC assessed breast tissue and noted no breakdown, just areola edema.  Breast appear fuller today than yesterday. LC described transient soreness when the  Milk is coming. Sore nipple and engorgement prevention and tx reviewed. Storage of breast milk.  LC instructed mom on the use comfort gels after feedings, alternating with coconut oil and breast shells except when sleeping.  LC provided comfort gels , breast shells and hand pump ( #24 F and #27 F ).  LC discussed the importance of STS feedings until the baby can stay awake for majority of feeding , back to birth weight and gaining steadily.  LC updated the doc flow sheets per mom and grandmother.  Mom has the [facility] pamphlet with phone numbers.    Maternal Data    Feeding Feeding Type: (grandmother mentioned she had packed the feeding diary sheet)  LATCH Score                   Interventions Interventions: Breast feeding basics reviewed  Lactation Tools Discussed/Used Tools: Shells;Pump;Comfort gels;Coconut oil;Flanges Flange Size: 24;27 Shell Type: Inverted Breast pump type: Manual Pump Review: Milk Storage;Setup, frequency, and cleaning Initiated by:: MAI Date initiated:: 04/10/19   Consult Status Consult Status: Complete Date: 04/10/19    Brittney Holmes 04/10/2019, 11:54 AM

## 2019-04-10 NOTE — Progress Notes (Addendum)
Subjective: Postpartum Day 2: Cesarean Delivery Patient reports tolerating PO, + flatus and no problems voiding. Up and moving, mild abdominal pain.   No headache, no change in vision, no hand/foot/face swelling, no RUQ tenderness.  Objective: Vital signs in last 24 hours: Temp:  [98 F (36.7 C)-98.7 F (37.1 C)] 98.7 F (37.1 C) (02/10 0527) Pulse Rate:  [88-92] 91 (02/10 0527) Resp:  [14-18] 18 (02/10 0527) BP: (99-118)/(68-92) 116/92 (02/10 0527) SpO2:  [98 %-99 %] 99 % (02/09 2122)  Physical Exam:  General: alert, cooperative, appears stated age and no distress Lochia: appropriate Uterine Fundus: firm Incision: healing well, no significant drainage, tegaderm with leakage and pulling DVT Evaluation: No evidence of DVT seen on physical exam. Negative Homan's sign. No cords or calf tenderness.  Recent Labs    04/08/19 1029 04/09/19 0656  HGB 10.5* 8.5*  HCT 34.6* 27.4*    Assessment/Plan: Status post Cesarean section. Doing well postoperatively.  No headache today, BP's normotensive, no evidence of PEC. Continue current care. Ok to replace tegaderm. Patient may opt to discharge home later today, otherwise dicharge planned for tomorrow.  Shirlean Mylar 04/10/2019, 8:30 AM

## 2019-04-10 NOTE — Discharge Instructions (Signed)
Cesarean Delivery, Care After This sheet gives you information about how to care for yourself after your procedure. Your health care provider may also give you more specific instructions. If you have problems or questions, contact your health care provider. What can I expect after the procedure? After the procedure, it is common to have:  A small amount of blood or clear fluid coming from the incision.  Some redness, swelling, and pain in your incision area.  Some abdominal pain and soreness.  Vaginal bleeding (lochia). Even though you did not have a vaginal delivery, you will still have vaginal bleeding and discharge.  Pelvic cramps.  Fatigue. You may have pain, swelling, and discomfort in the tissue between your vagina and your anus (perineum) if:  Your C-section was unplanned, and you were allowed to labor and push.  An incision was made in the area (episiotomy) or the tissue tore during attempted vaginal delivery. Follow these instructions at home: Incision care   Follow instructions from your health care provider about how to take care of your incision. Make sure you: ? Wash your hands with soap and water before you change your bandage (dressing). If soap and water are not available, use hand sanitizer. ? If you have a dressing, change it or remove it as told by your health care provider. ? Leave stitches (sutures), skin staples, skin glue, or adhesive strips in place. These skin closures may need to stay in place for 2 weeks or longer. If adhesive strip edges start to loosen and curl up, you may trim the loose edges. Do not remove adhesive strips completely unless your health care provider tells you to do that.  Check your incision area every day for signs of infection. Check for: ? More redness, swelling, or pain. ? More fluid or blood. ? Warmth. ? Pus or a bad smell.  Do not take baths, swim, or use a hot tub until your health care provider says it's okay. Ask your health  care provider if you can take showers.  When you cough or sneeze, hug a pillow. This helps with pain and decreases the chance of your incision opening up (dehiscing). Do this until your incision heals. Medicines  Take over-the-counter and prescription medicines only as told by your health care provider.  If you were prescribed an antibiotic medicine, take it as told by your health care provider. Do not stop taking the antibiotic even if you start to feel better.  Do not drive or use heavy machinery while taking prescription pain medicine. Lifestyle  Do not drink alcohol. This is especially important if you are breastfeeding or taking pain medicine.  Do not use any products that contain nicotine or tobacco, such as cigarettes, e-cigarettes, and chewing tobacco. If you need help quitting, ask your health care provider. Eating and drinking  Drink at least 8 eight-ounce glasses of water every day unless told not to by your health care provider. If you breastfeed, you may need to drink even more water.  Eat high-fiber foods every day. These foods may help prevent or relieve constipation. High-fiber foods include: ? Whole grain cereals and breads. ? Brown rice. ? Beans. ? Fresh fruits and vegetables. Activity   If possible, have someone help you care for your baby and help with household activities for at least a few days after you leave the hospital.  Return to your normal activities as told by your health care provider. Ask your health care provider what activities are safe for   you.  Rest as much as possible. Try to rest or take a nap while your baby is sleeping.  Do not lift anything that is heavier than 10 lbs (4.5 kg), or the limit that you were told, until your health care provider says that it is safe.  Talk with your health care provider about when you can engage in sexual activity. This may depend on your: ? Risk of infection. ? How fast you heal. ? Comfort and desire to  engage in sexual activity. General instructions  Do not use tampons or douches until your health care provider approves.  Wear loose, comfortable clothing and a supportive and well-fitting bra.  Keep your perineum clean and dry. Wipe from front to back when you use the toilet.  If you pass a blood clot, save it and call your health care provider to discuss. Do not flush blood clots down the toilet before you get instructions from your health care provider.  Keep all follow-up visits for you and your baby as told by your health care provider. This is important. Contact a health care provider if:  You have: ? A fever. ? Bad-smelling vaginal discharge. ? Pus or a bad smell coming from your incision. ? Difficulty or pain when urinating. ? A sudden increase or decrease in the frequency of your bowel movements. ? More redness, swelling, or pain around your incision. ? More fluid or blood coming from your incision. ? A rash. ? Nausea. ? Little or no interest in activities you used to enjoy. ? Questions about caring for yourself or your baby.  Your incision feels warm to the touch.  Your breasts turn red or become painful or hard.  You feel unusually sad or worried.  You vomit.  You pass a blood clot from your vagina.  You urinate more than usual.  You are dizzy or light-headed. Get help right away if:  You have: ? Pain that does not go away or get better with medicine. ? Chest pain. ? Difficulty breathing. ? Blurred vision or spots in your vision. ? Thoughts about hurting yourself or your baby. ? New pain in your abdomen or in one of your legs. ? A severe headache.  You faint.  You bleed from your vagina so much that you fill more than one sanitary pad in one hour. Bleeding should not be heavier than your heaviest period. Summary  After the procedure, it is common to have pain at your incision site, abdominal cramping, and slight bleeding from your vagina.  Check  your incision area every day for signs of infection.  Tell your health care provider about any unusual symptoms.  Keep all follow-up visits for you and your baby as told by your health care provider. This information is not intended to replace advice given to you by your health care provider. Make sure you discuss any questions you have with your health care provider. Document Revised: 08/23/2017 Document Reviewed: 08/23/2017 Elsevier Patient Education  2020 Elsevier Inc.  

## 2019-04-10 NOTE — Anesthesia Postprocedure Evaluation (Signed)
Anesthesia Post Note  Patient: Brittney Holmes  Procedure(s) Performed: CESAREAN SECTION (N/A ) UNILATERAL SALPINGECTOMY (Right Abdomen)     Patient location during evaluation: PACU Anesthesia Type: Spinal Level of consciousness: oriented and awake and alert Pain management: pain level controlled Vital Signs Assessment: post-procedure vital signs reviewed and stable Respiratory status: spontaneous breathing, respiratory function stable and patient connected to nasal cannula oxygen Cardiovascular status: blood pressure returned to baseline and stable Postop Assessment: no headache, no backache and no apparent nausea or vomiting Anesthetic complications: no    Last Vitals:  Vitals:   04/09/19 2122 04/10/19 0527  BP: 104/73 (!) 116/92  Pulse: 90 91  Resp: 16 18  Temp: 36.7 C 37.1 C  SpO2: 99%     Last Pain:  Vitals:   04/10/19 1243  TempSrc:   PainSc: (P) 3    Pain Goal: Patients Stated Pain Goal: (P) 2 (04/10/19 1243)                 [name] S

## 2019-04-12 ENCOUNTER — Encounter: Payer: Medicaid Other | Admitting: Obstetrics & Gynecology

## 2019-04-15 NOTE — BH Specialist Note (Signed)
Pt did not arrive to video visit and did not answer the phone ; Left HIPPA-compliant message to call back [name] from [facility] at [phone].  ; left MyChart message for patient.    Integrated Behavioral Health via Telemedicine Video Visit  04/15/2019 Brittney Holmes [id number]   Rae Lips

## 2019-04-17 ENCOUNTER — Encounter: Payer: Medicaid Other | Admitting: Obstetrics & Gynecology

## 2019-04-19 ENCOUNTER — Ambulatory Visit: Payer: Medicaid Other | Admitting: Clinical

## 2019-04-19 DIAGNOSIS — Z91199 Patient's noncompliance with other medical treatment and regimen due to unspecified reason: Secondary | ICD-10-CM

## 2019-04-19 DIAGNOSIS — Z5329 Procedure and treatment not carried out because of patient's decision for other reasons: Secondary | ICD-10-CM

## 2019-04-23 ENCOUNTER — Ambulatory Visit: Payer: Medicaid Other

## 2019-04-30 ENCOUNTER — Telehealth: Payer: Self-pay | Admitting: Family Medicine

## 2019-04-30 NOTE — Telephone Encounter (Signed)
Pt concern addressed via MyChart.   Jarin Cornfield, RN  

## 2019-04-30 NOTE — Telephone Encounter (Signed)
Ms. Jasek is requesting a call back from the clinical staff. She has not had a BM in three days.

## 2019-05-01 ENCOUNTER — Ambulatory Visit: Payer: Medicaid Other

## 2019-05-07 ENCOUNTER — Encounter: Payer: Self-pay | Admitting: Family Medicine

## 2019-05-07 ENCOUNTER — Telehealth (INDEPENDENT_AMBULATORY_CARE_PROVIDER_SITE_OTHER): Payer: Medicaid Other | Admitting: Family Medicine

## 2019-05-07 VITALS — BP 156/104 | HR 88

## 2019-05-07 DIAGNOSIS — Z98891 History of uterine scar from previous surgery: Secondary | ICD-10-CM

## 2019-05-07 DIAGNOSIS — R519 Headache, unspecified: Secondary | ICD-10-CM | POA: Diagnosis not present

## 2019-05-07 DIAGNOSIS — O139 Gestational [pregnancy-induced] hypertension without significant proteinuria, unspecified trimester: Secondary | ICD-10-CM

## 2019-05-07 DIAGNOSIS — O135 Gestational [pregnancy-induced] hypertension without significant proteinuria, complicating the puerperium: Secondary | ICD-10-CM | POA: Diagnosis not present

## 2019-05-07 MED ORDER — ENALAPRIL MALEATE 5 MG PO TABS: 5.0000 mg | ORAL_TABLET | Freq: Every day | ORAL | 0 refills | Status: AC

## 2019-05-07 MED ORDER — ACETAMINOPHEN 500 MG PO TABS: 500.0000 mg | ORAL_TABLET | Freq: Four times a day (QID) | ORAL | 0 refills | Status: AC | PRN

## 2019-05-07 NOTE — Progress Notes (Signed)
I connected with@ on 05/07/19 at  3:35 PM EST by: Telephone and verified that I am speaking with the correct person using two identifiers.  Patient is located at [facility] and provider is located at [place].     The purpose of this virtual visit is to provide medical care while limiting exposure to the novel coronavirus. I discussed the limitations, risks, security and privacy concerns of performing an evaluation and management service by Mychart and the availability of in person appointments. I also discussed with the patient that there may be a patient responsible charge related to this service. By engaging in this virtual visit, you consent to the provision of healthcare.  Additionally, you authorize for your insurance to be billed for the services provided during this visit.  The patient expressed understanding and agreed to proceed.  The following staff members participated in the virtual visit: Chelsea Fair,MD  Post Partum Visit Note Subjective:    [name] is a 24 y.o. [id number] female who presents for a postpartum visit. She is 4 weeks postpartum following a . I have fully reviewed the prenatal and intrapartum course. The delivery was at [redacted]w[redacted]d gestational weeks. Outcome: primary cesarean section, low transverse incision. Anesthesia: spinal. Postpartum course has been unremarkable. Baby's course has been unremarkable. Baby is feeding by both breast and bottle - gerber good stsrt gentel. Bleeding no bleeding. Bowel function is normal. Bladder function is normal. Patient is not sexually active. Contraception method is tubal ligation via Salpingectomy . Postpartum depression screening: Negative.  Denies vision changes, fever, chills, cough, SOB, chest pain, abdominal pain, nausea, vomiting, diarrhea, constipation, dysuria, hematuria, hematochezia, melena, difficulty moving arms/legs, speech difficulty, trouble eating, confusion or any other complaints.  Review of Systems All other systems negative  unless noted above in HPI.   Objective:   Vitals:   05/07/19 1525 05/07/19 1530  BP: (!) 145/96 (!) 156/104  Pulse: 84 88   General: Well-developed, well-nourished female in no acute distress.  Psych: Appropriate mood and affect  Incision: Appears CDI       Assessment:   No postpartum exam. Pap smear not done at today's visit. Last pap smear 02/12/2019 and results were Normal . Next pap due 2023.   Plan:   1. Contraception: tubal ligation 2. GHTN: Patient with elevated BP's. Pr/Cr and CMP WNL in hospital. Has mild intermittent headache and has not taken anything today. Enalapril 5 mg prescribed to start tonight. Try taking Tylenol and ibuprofen for headache. Given BP parameters for going to [facility] or if headache persists or other concerns.  3. Follow up as needed with provider. Requested visit in 2-3 days for RN to call for BP check.  20 minutes of non-face-to-face time spent with the patient   [name], MD 05/07/2019 3:45 PM   Patient complains of vision Changes and headaches and neck pain. States she is seeing spots.

## 2019-05-07 NOTE — Patient Instructions (Signed)

## 2019-05-10 ENCOUNTER — Encounter: Payer: Self-pay | Admitting: *Deleted

## 2019-05-10 ENCOUNTER — Ambulatory Visit (INDEPENDENT_AMBULATORY_CARE_PROVIDER_SITE_OTHER): Payer: Medicaid Other | Admitting: *Deleted

## 2019-05-10 ENCOUNTER — Other Ambulatory Visit: Payer: Self-pay

## 2019-05-10 VITALS — BP 133/95 | HR 77

## 2019-05-10 DIAGNOSIS — Z0130 Encounter for examination of blood pressure without abnormal findings: Secondary | ICD-10-CM

## 2019-05-10 DIAGNOSIS — Z8759 Personal history of other complications of pregnancy, childbirth and the puerperium: Secondary | ICD-10-CM

## 2019-05-10 NOTE — Progress Notes (Addendum)
Called pt to initiate scheduled telephone visit for BP check and she did not answer. Message was left that I will call back in 10 minutes. MyChart message also sent to pt.   I connected with  Brittney Holmes on 05/10/19 at 09:03 AM EST by telephone and verified that I am speaking with the correct person using two identifiers.   I discussed the limitations, risks, security and privacy concerns of performing an evaluation and management service by telephone and the availability of in person appointments. I also discussed with the patient that there may be a patient responsible charge related to this service. The patient expressed understanding and agreed to proceed.  Pt states she feels well and denies H/A or visual disturbances. Pt states she has been taking her BP every 4 hours. I advised pt that she does not need to check that often unless she is having sx of pre-e. I advised that she check it once daily. She was not able to obtain Rx for Enalapril as prescribed on 3/9 until yesterday and therefore has only taken once last evening. She checked BP during our call and reported 133/95, P- 77. I advised she will need to have another telephone visit next week for BP check since she just started taking the medication. Pt then asked if she can begin to take tub baths. Per chart review, she [facility] for incision check and PP appt on 3/9 was virtual. I asked pt if her incision looked healed to her (no open areas) and if there is any drainage, swelling or redness. Pt stated she thinks it is healed but doesn't want to be wrong. I advised pt to continue with only showers for now and suggested that she come in to office for visit next week so that we can assess her incision as well as BP. Pt voiced understanding and agreed to appt on 3/17 @ 1430.   Damarri Rampy, Drucilla Schmidt, RN 05/10/2019  9:13 AM   Chart reviewed for nurse visit. Agree with plan of care.   Currie Paris, NP 05/10/2019 11:05 AM '

## 2019-05-15 ENCOUNTER — Other Ambulatory Visit: Payer: Self-pay

## 2019-05-15 ENCOUNTER — Ambulatory Visit (INDEPENDENT_AMBULATORY_CARE_PROVIDER_SITE_OTHER): Payer: Medicaid Other | Admitting: *Deleted

## 2019-05-15 VITALS — BP 128/96 | HR 79

## 2019-05-15 DIAGNOSIS — Z7689 Persons encountering health services in other specified circumstances: Secondary | ICD-10-CM | POA: Diagnosis not present

## 2019-05-15 DIAGNOSIS — O165 Unspecified maternal hypertension, complicating the puerperium: Secondary | ICD-10-CM

## 2019-05-15 DIAGNOSIS — Z0130 Encounter for examination of blood pressure without abnormal findings: Secondary | ICD-10-CM

## 2019-05-15 DIAGNOSIS — Z8759 Personal history of other complications of pregnancy, childbirth and the puerperium: Secondary | ICD-10-CM

## 2019-05-15 MED ORDER — FERROUS SULFATE 220 (44 FE) MG/5ML PO ELIX: 220.0000 mg | ORAL_SOLUTION | Freq: Every day | ORAL | 3 refills | Status: DC

## 2019-05-15 NOTE — Progress Notes (Signed)
Here for bp check and incision check. States did start vasotec about 5 days ago.  States never did take iron po. We discussed she was anemic at delivery so she will need to take the iron. Iron rx resent.  Also advised to be careful as she had reaction to fereheme ; is possible but not as likely with po iron.  Bp elevated today and repeated. BP and history reported to Dr. Shawnie Pons. Advised patient to continue taking her Enalapril. and will need to see a PCP for HTN management. She states she doesn't have a PCP but did used to go to [facility]. Referral made to [facility].  Incision clean, dry, intact and appears well healed.  Sua Spadafora,RN

## 2019-05-15 NOTE — Progress Notes (Signed)
Patient seen and assessed by nursing staff.  Agree with documentation and plan.  

## 2019-05-23 ENCOUNTER — Emergency Department
Admission: EM | Admit: 2019-05-23 | Discharge: 2019-05-23 | Disposition: A | Discharge status: Home / Self Care | Payer: Medicaid Other | Attending: Emergency Medicine | Admitting: Emergency Medicine

## 2019-05-23 ENCOUNTER — Emergency Department: Payer: Medicaid Other

## 2019-05-23 ENCOUNTER — Other Ambulatory Visit: Payer: Self-pay

## 2019-05-23 DIAGNOSIS — R1110 Vomiting, unspecified: Secondary | ICD-10-CM | POA: Diagnosis not present

## 2019-05-23 DIAGNOSIS — Z20822 Contact with and (suspected) exposure to covid-19: Secondary | ICD-10-CM | POA: Diagnosis not present

## 2019-05-23 DIAGNOSIS — R1030 Lower abdominal pain, unspecified: Secondary | ICD-10-CM | POA: Diagnosis not present

## 2019-05-23 DIAGNOSIS — R52 Pain, unspecified: Secondary | ICD-10-CM | POA: Diagnosis not present

## 2019-05-23 DIAGNOSIS — R55 Syncope and collapse: Secondary | ICD-10-CM | POA: Diagnosis not present

## 2019-05-23 DIAGNOSIS — R000 Tachycardia, unspecified: Secondary | ICD-10-CM | POA: Diagnosis not present

## 2019-05-23 DIAGNOSIS — R0902 Hypoxemia: Secondary | ICD-10-CM | POA: Diagnosis not present

## 2019-05-23 DIAGNOSIS — R1084 Generalized abdominal pain: Secondary | ICD-10-CM | POA: Diagnosis not present

## 2019-05-23 DIAGNOSIS — R509 Fever, unspecified: Secondary | ICD-10-CM | POA: Insufficient documentation

## 2019-05-23 DIAGNOSIS — G4489 Other headache syndrome: Secondary | ICD-10-CM | POA: Diagnosis not present

## 2019-05-23 LAB — CBC WITH DIFFERENTIAL/PLATELET
Abs Immature Granulocytes: 0.07 10*3/uL (ref 0.00–0.07)
Basophils Absolute: 0.1 10*3/uL (ref 0.0–0.1)
Basophils Relative: 1 %
Eosinophils Absolute: 8.2 10*3/uL — ABNORMAL HIGH (ref 0.0–0.5)
Eosinophils Relative: 40 %
HCT: 34.3 % — ABNORMAL LOW (ref 36.0–46.0)
Hemoglobin: 10.5 g/dL — ABNORMAL LOW (ref 12.0–15.0)
Immature Granulocytes: 0 %
Lymphocytes Relative: 9 %
Lymphs Abs: 1.9 10*3/uL (ref 0.7–4.0)
MCH: 24.8 pg — ABNORMAL LOW (ref 26.0–34.0)
MCHC: 30.6 g/dL (ref 30.0–36.0)
MCV: 81.1 fL (ref 80.0–100.0)
Monocytes Absolute: 1.1 10*3/uL — ABNORMAL HIGH (ref 0.1–1.0)
Monocytes Relative: 6 %
Neutro Abs: 9.1 10*3/uL — ABNORMAL HIGH (ref 1.7–7.7)
Neutrophils Relative %: 44 %
Platelets: 412 10*3/uL — ABNORMAL HIGH (ref 150–400)
RBC: 4.23 MIL/uL (ref 3.87–5.11)
RDW: 17 % — ABNORMAL HIGH (ref 11.5–15.5)
Smear Review: NORMAL
WBC: 20.5 10*3/uL — ABNORMAL HIGH (ref 4.0–10.5)
nRBC: 0 % (ref 0.0–0.2)

## 2019-05-23 LAB — COMPREHENSIVE METABOLIC PANEL
ALT: 24 U/L (ref 0–44)
AST: 31 U/L (ref 15–41)
Albumin: 3.8 g/dL (ref 3.5–5.0)
Alkaline Phosphatase: 90 U/L (ref 38–126)
Anion gap: 8 (ref 5–15)
BUN: 7 mg/dL (ref 6–20)
CO2: 22 mmol/L (ref 22–32)
Calcium: 9.5 mg/dL (ref 8.9–10.3)
Chloride: 106 mmol/L (ref 98–111)
Creatinine, Ser: 0.67 mg/dL (ref 0.44–1.00)
GFR calc Af Amer: >60 mL/min (ref >60)
GFR calc non Af Amer: >60 mL/min (ref >60)
Glucose, Bld: 110 mg/dL — ABNORMAL HIGH (ref 70–99)
Potassium: 3.3 mmol/L — ABNORMAL LOW (ref 3.5–5.1)
Sodium: 136 mmol/L (ref 135–145)
Total Bilirubin: 0.9 mg/dL (ref 0.3–1.2)
Total Protein: 7.4 g/dL (ref 6.5–8.1)

## 2019-05-23 LAB — CBC
HCT: 31.5 % — ABNORMAL LOW (ref 36.0–46.0)
Hemoglobin: 9.5 g/dL — ABNORMAL LOW (ref 12.0–15.0)
MCH: 24.9 pg — ABNORMAL LOW (ref 26.0–34.0)
MCHC: 30.2 g/dL (ref 30.0–36.0)
MCV: 82.7 fL (ref 80.0–100.0)
Platelets: 374 10*3/uL (ref 150–400)
RBC: 3.81 MIL/uL — ABNORMAL LOW (ref 3.87–5.11)
RDW: 16.9 % — ABNORMAL HIGH (ref 11.5–15.5)
WBC: 17.4 10*3/uL — ABNORMAL HIGH (ref 4.0–10.5)
nRBC: 0 % (ref 0.0–0.2)

## 2019-05-23 LAB — URINALYSIS, ROUTINE W REFLEX MICROSCOPIC
Bilirubin Urine: NEGATIVE
Glucose, UA: NEGATIVE mg/dL
Hgb urine dipstick: NEGATIVE
Ketones, ur: 20 mg/dL — AB
Leukocytes,Ua: NEGATIVE
Nitrite: NEGATIVE
Protein, ur: NEGATIVE mg/dL
Specific Gravity, Urine: 1.006 (ref 1.005–1.030)
pH: 7 (ref 5.0–8.0)

## 2019-05-23 LAB — WET PREP, GENITAL
Clue Cells Wet Prep HPF POC: NONE SEEN
Sperm: NONE SEEN
Trich, Wet Prep: NONE SEEN
WBC, Wet Prep HPF POC: NONE SEEN
Yeast Wet Prep HPF POC: NONE SEEN

## 2019-05-23 LAB — RESPIRATORY PANEL BY RT PCR (FLU A&B, COVID)
Influenza A by PCR: NEGATIVE
Influenza B by PCR: NEGATIVE
SARS Coronavirus 2 by RT PCR: NEGATIVE

## 2019-05-23 LAB — TROPONIN I (HIGH SENSITIVITY)
Troponin I (High Sensitivity): <2 ng/L (ref <18)
Troponin I (High Sensitivity): 3 ng/L (ref <18)

## 2019-05-23 LAB — CHLAMYDIA/NGC RT PCR (ARMC ONLY)
Chlamydia Tr: NOT DETECTED
N gonorrhoeae: NOT DETECTED

## 2019-05-23 LAB — PROTIME-INR
INR: 1 (ref 0.8–1.2)
Prothrombin Time: 13.3 seconds (ref 11.4–15.2)

## 2019-05-23 LAB — POC SARS CORONAVIRUS 2 AG: SARS Coronavirus 2 Ag: NEGATIVE

## 2019-05-23 LAB — APTT: aPTT: 47 seconds — ABNORMAL HIGH (ref 24–36)

## 2019-05-23 LAB — LACTIC ACID, PLASMA
Lactic Acid, Venous: 0.9 mmol/L (ref 0.5–1.9)
Lactic Acid, Venous: 0.9 mmol/L (ref 0.5–1.9)

## 2019-05-23 LAB — PROCALCITONIN: Procalcitonin: <0.1 ng/mL

## 2019-05-23 LAB — LIPASE, BLOOD: Lipase: 21 U/L (ref 11–51)

## 2019-05-23 LAB — PATHOLOGIST SMEAR REVIEW

## 2019-05-23 LAB — POCT PREGNANCY, URINE: Preg Test, Ur: NEGATIVE

## 2019-05-23 MED ORDER — IOHEXOL 9 MG/ML PO SOLN
500.0000 mL | ORAL | Status: AC
  Administered 2019-05-23 (×2): 500 mL via ORAL

## 2019-05-23 MED ORDER — IOHEXOL 300 MG/ML  SOLN
75.0000 mL | Freq: Once | INTRAMUSCULAR | Status: AC | PRN
  Administered 2019-05-23: 75 mL via INTRAVENOUS

## 2019-05-23 MED ORDER — VANCOMYCIN HCL IN DEXTROSE 1-5 GM/200ML-% IV SOLN: 1000.0000 mg | Freq: Once | INTRAVENOUS | Status: DC

## 2019-05-23 MED ORDER — ENALAPRILAT 1.25 MG/ML IV SOLN
1.2500 mg | Freq: Once | INTRAVENOUS | Status: AC
  Administered 2019-05-23: 1.25 mg via INTRAVENOUS
  Filled 2019-05-23: qty 2

## 2019-05-23 MED ORDER — ACETAMINOPHEN 500 MG PO TABS
1000.0000 mg | ORAL_TABLET | Freq: Once | ORAL | Status: AC
  Administered 2019-05-23: 1000 mg via ORAL
  Filled 2019-05-23: qty 2

## 2019-05-23 MED ORDER — SODIUM CHLORIDE 0.9 % IV BOLUS (SEPSIS)
500.0000 mL | Freq: Once | INTRAVENOUS | Status: AC
  Administered 2019-05-23: 500 mL via INTRAVENOUS

## 2019-05-23 MED ORDER — SODIUM CHLORIDE 0.9 % IV SOLN
2.0000 g | Freq: Once | INTRAVENOUS | Status: AC
  Administered 2019-05-23: 2 g via INTRAVENOUS
  Filled 2019-05-23: qty 2

## 2019-05-23 MED ORDER — METRONIDAZOLE IN NACL 5-0.79 MG/ML-% IV SOLN
500.0000 mg | Freq: Once | INTRAVENOUS | Status: AC
  Administered 2019-05-23: 500 mg via INTRAVENOUS
  Filled 2019-05-23: qty 100

## 2019-05-23 MED ORDER — VANCOMYCIN HCL 1250 MG/250ML IV SOLN
1250.0000 mg | Freq: Once | INTRAVENOUS | Status: AC
  Administered 2019-05-23: 1250 mg via INTRAVENOUS
  Filled 2019-05-23: qty 250

## 2019-05-23 MED ORDER — SODIUM CHLORIDE 0.9 % IV BOLUS (SEPSIS)
1000.0000 mL | Freq: Once | INTRAVENOUS | Status: AC
  Administered 2019-05-23: 1000 mL via INTRAVENOUS

## 2019-05-23 NOTE — ED Provider Notes (Signed)
Repeat labs look improved.  She may have chronic leukocytosis.  She has no specific abdominal tenderness.  Should be advised to follow-up in 1 to 2 days for recheck.   Emily Filbert, MD 05/23/19 1045

## 2019-05-23 NOTE — ED Notes (Signed)
This RN at bedside. Pt c/o itching at IV site where Vancomycin is infusing. IV site and forearm noted to be red and warm to touch from IV site proximal to anterior elbow. Vancomycin stopped. MD made aware.

## 2019-05-23 NOTE — ED Notes (Signed)
Pt given graham crackers and apple juice per MD order for PO challenge.

## 2019-05-23 NOTE — Progress Notes (Signed)
PHARMACY -  BRIEF ANTIBIOTIC NOTE   Pharmacy has received consult(s) for vanc/cefepime from an ED provider.  The patient's profile has been reviewed for ht/wt/allergies/indication/available labs.    One time order(s) placed for vancomycin 1.25g IV load and cefepime 2g IV x 1 in ED.  Further antibiotics/pharmacy consults should be ordered by admitting physician if indicated.                       Thank you,  Thomasene Ripple, PharmD, BCPS Clinical Pharmacist 05/23/2019  5:07 AM

## 2019-05-23 NOTE — ED Provider Notes (Signed)
IMPRESSION: 1. Low attenuation noted along the anterior lower uterine segment consistent with the C-section scar. No adjacent defined fluid collection is seen to suggest an abscess. There is a small amount of pelvic free fluid, likely physiologic. 2. Mild increased stool burden in the colon. No bowel obstruction or inflammation. 3. Small dependent gallstones.  No other abnormalities.  CT as dictated above.  On repeat examination no focal abdominal tenderness.  I will repeat a CBC and she will have a p.o. trial.   Emily Filbert, MD 05/23/19 [phone]

## 2019-05-23 NOTE — ED Notes (Signed)
Pt to toilet for self vaginal swab and urine specimen. Pt ambulates to toilet with ease

## 2019-05-23 NOTE — ED Notes (Signed)
Pt to CT

## 2019-05-23 NOTE — ED Triage Notes (Signed)
Pt arrives via EMS from home after she woke up at [facility] and was experiencing headache, weakness, general body aches, abdominal pain, blurred vision, dizziness. PT denies these symptoms waking her up from her sleep. PT states she only experiences abdominal pain and headache when she is sitting still.  On arrival pt is A&Ox4, moves all extremities appropriately, no slurred speech, equal strength, no droop noted.   With EMS pt had hr of 170, BP 160/90, and denies chest pain. Pt states her main concern is abdominal pain due to this being recurrent since 2/8 folllowing C-section. Also reports decreased urine output.

## 2019-05-23 NOTE — ED Provider Notes (Signed)
[facility] Emergency Department Provider Note   ____________________________________________   First MD Initiated Contact with Patient 05/23/19 0406     (approximate)  I have reviewed the triage vital signs and the nursing notes.   HISTORY  Chief Complaint Tachycardia, Dizziness, and Abdominal Pain    HPI Brittney Holmes is a 24 y.o. female brought to the ED via EMS from home with multiple medical complaints.  Patient reports she woke up around 3 AM generalized weakness, body aches, lower abdominal pain, dizziness, blurred vision, headache and decreased urine output.  Most recently patient had C-section on 2/8.  She is breast-feeding.  States she has been having recurrent abdominal pain since her C-section.  Is on antihypertensives for gestational hypertension.  Denies cough, chest pain, shortness of breath, vomiting or diarrhea.  Notes slight vaginal discharge but denies STDs.  Denies known COVID-19 exposure.       Past Medical History:  Diagnosis Date  . Chlamydia infection 05/31/2013  . Cornual ectopic pregnancy 06/18/2016   s/p ELAP, R cornual resection, salpingectomy '[ ]'  Needs serial HCGs until undetectable Needs cesarean deliveries at 36-37 weeks for subsequent pregnancies.  . 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 Hemoperitoneum due to rupture of right cornual ectopic pregnancy 06/18/2016  . Mild preeclampsia 10/07/2012  . Trichomonas infection 02/14/2014    Patient Active Problem List   Diagnosis Date Noted  . History of salpingoophorectomy 04/08/2019  . Status post cesarean section and left salpingectomy 04/08/2019  . GBS (group B Streptococcus carrier), +RV culture, currently pregnant 04/05/2019  . Preterm contractions 03/07/2019  . Microcytic anemia 02/27/2019  . History of pre-eclampsia in prior pregnancy, currently pregnant 01/23/2019  . Supervision of high risk pregnancy, antepartum 01/03/2019  . At high risk for  complications of intrauterine pregnancy (IUP) 09/06/2018  . Constipation 11/09/2016  . Anxiety 11/09/2016    Past Surgical History:  Procedure Laterality Date  . CESAREAN SECTION N/A 04/08/2019   Procedure: CESAREAN SECTION;  Surgeon: Osborne Oman, MD;  Location: [facility] LD ORS;  Service: Obstetrics;  Laterality: N/A;  . LAPAROTOMY  06/18/2016   Procedure: EXPLORATORY LAPAROTOMY, RIGHT CORNEAL WEDGE RESECTION,RIGHT SALPINGECTOMY;  Surgeon: Osborne Oman, MD;  Location: [facility];  Service: Gynecology;;  . SALPINGOOPHORECTOMY     rt. per pt  . UNILATERAL SALPINGECTOMY Right 04/08/2019   Procedure: UNILATERAL SALPINGECTOMY;  Surgeon: Osborne Oman, MD;  Location: [facility] LD ORS;  Service: Obstetrics;  Laterality: Right;    Prior to Admission medications   Medication Sig Start Date End Date Taking? Authorizing Provider  acetaminophen (TYLENOL) 500 MG tablet Take 1 tablet (500 mg total) by mouth every 6 (six) hours as needed for mild pain or headache. 05/07/19   Fair, Marin Shutter, MD  Blood Pressure Monitoring (BLOOD PRESSURE KIT) DEVI 1 Device by Does not apply route as needed. 01/23/19   [name], Sallyanne Havers, MD  enalapril (VASOTEC) 5 MG tablet Take 1 tablet (5 mg total) by mouth at bedtime. 05/07/19   Fair, Marin Shutter, MD  ferrous sulfate (IRON SUPPLEMENT) 220 (44 Fe) MG/5ML solution Take 5 mLs (220 mg total) by mouth daily. 05/15/19   Donnamae Jude, MD  ibuprofen (ADVIL) 100 MG/5ML suspension Take 30 mLs (600 mg total) by mouth every 6 (six) hours as needed. 04/10/19   Gavin Pound, CNM    Allergies Feraheme [ferumoxytol]  Family History  Problem Relation Age of Onset  . Cancer Maternal Grandmother  lung    Social History Social History   Tobacco Use  . Smoking status: Never Smoker  . Smokeless tobacco: Never Used  Substance Use Topics  . Alcohol use: No  . Drug use: No    Review of Systems  Constitutional: Positive for fever. Eyes: No visual changes. ENT: No sore  throat. Cardiovascular: Denies chest pain.  Positive for palpitations. Respiratory: Denies shortness of breath. Gastrointestinal: Positive for lower abdominal pain and nausea, no vomiting.  No diarrhea.  No constipation. Genitourinary: Negative for dysuria. Musculoskeletal: Negative for back pain. Skin: Negative for rash. Neurological: Positive for headache.  Negative for focal weakness or numbness.   ____________________________________________   PHYSICAL EXAM:  VITAL SIGNS: ED Triage Vitals  Enc Vitals Group     BP 05/23/19 0403 (!) 140/92     Pulse Rate 05/23/19 0403 (!) 119     Resp 05/23/19 0403 20     Temp 05/23/19 0403 (!) 102.8 F (39.3 C)     Temp Source 05/23/19 0403 Oral     SpO2 05/23/19 0403 100 %     Weight 05/23/19 0405 98 lb (44.5 kg)     Height 05/23/19 0405 '5\' 2"'  (1.575 m)     Head Circumference --      Peak Flow --      Pain Score 05/23/19 0404 8     Pain Loc --      Pain Edu? --      Excl. in [place]? --     Constitutional: Alert and oriented. Well appearing and in mild acute distress. Eyes: Conjunctivae are normal. PERRL. EOMI. Head: Atraumatic. Nose: No congestion/rhinnorhea. Mouth/Throat: Mucous membranes are moist.   Neck: No stridor.  Supple neck without meningismus. Cardiovascular: Tachycardic rate, regular rhythm. Grossly normal heart sounds.  Good peripheral circulation. Respiratory: Normal respiratory effort.  No retractions. Lungs CTAB. Gastrointestinal: Soft and mildly tender to palpation lower abdomen without rebound or guarding. No distention. No abdominal bruits. No CVA tenderness. Musculoskeletal: No lower extremity tenderness nor edema.  No joint effusions. Neurologic: Alert and oriented x3.  CN II-XII grossly intact.  Normal speech and language. No gross focal neurologic deficits are appreciated. MAEx4. Skin:  Skin is warm, dry and intact. No rash noted.  No petechiae. Psychiatric: Mood and affect are normal. Speech and behavior are  normal.  ____________________________________________   LABS (all labs ordered are listed, but only abnormal results are displayed)  Labs Reviewed  COMPREHENSIVE METABOLIC PANEL - Abnormal; Notable for the following components:      Result Value   Potassium 3.3 (*)    Glucose, Bld 110 (*)    All other components within normal limits  CBC WITH DIFFERENTIAL/PLATELET - Abnormal; Notable for the following components:   WBC 20.5 (*)    Hemoglobin 10.5 (*)    HCT 34.3 (*)    MCH 24.8 (*)    RDW 17.0 (*)    Platelets 412 (*)    Neutro Abs 9.1 (*)    Monocytes Absolute 1.1 (*)    Eosinophils Absolute 8.2 (*)    All other components within normal limits  URINALYSIS, ROUTINE W REFLEX MICROSCOPIC - Abnormal; Notable for the following components:   Color, Urine YELLOW (*)    APPearance CLEAR (*)    Ketones, ur 20 (*)    All other components within normal limits  APTT - Abnormal; Notable for the following components:   aPTT 47 (*)    All other components within normal limits  RESPIRATORY  PANEL BY RT PCR (FLU A&B, COVID)  CHLAMYDIA/NGC RT PCR ([facility] ONLY)  WET PREP, GENITAL  CULTURE, BLOOD (ROUTINE X 2)  CULTURE, BLOOD (ROUTINE X 2)  URINE CULTURE  LACTIC ACID, PLASMA  LACTIC ACID, PLASMA  LIPASE, BLOOD  PROCALCITONIN  PROTIME-INR  PATHOLOGIST SMEAR REVIEW  POC SARS CORONAVIRUS 2 AG -  ED  POC URINE PREG, ED  POC SARS CORONAVIRUS 2 AG  POCT PREGNANCY, URINE  TROPONIN I (HIGH SENSITIVITY)  TROPONIN I (HIGH SENSITIVITY)   ____________________________________________  EKG  ED ECG REPORT I, Adellyn Capek J, the attending physician, personally viewed and interpreted this ECG.   Date: 05/23/2019  EKG Time: 0402  Rate: 117  Rhythm: sinus tachycardia  Axis: Normal  Intervals:none  ST&T Change: Nonspecific  ____________________________________________  RADIOLOGY  ED MD interpretation:  No acute cardiopulmonary process; CT pending  Official radiology  report(s): DG Chest Port 1 View  Result Date: 05/23/2019 CLINICAL DATA:  Fever and palpitations EXAM: PORTABLE CHEST 1 VIEW COMPARISON:  10/16/2016 FINDINGS: Normal heart size and mediastinal contours. No acute infiltrate or edema. No effusion or pneumothorax. No acute osseous findings. IMPRESSION: No active disease. Electronically Signed   By: Monte Fantasia M.D.   On: 05/23/2019 04:35    ____________________________________________   PROCEDURES  Procedure(s) performed (including Critical Care):  .1-3 Lead EKG Interpretation Performed by: Paulette Blanch, MD Authorized by: Paulette Blanch, MD     Interpretation: abnormal     ECG rate:  115   ECG rate assessment: tachycardic     Rhythm: sinus tachycardia     Ectopy: none     Conduction: normal       ____________________________________________   INITIAL IMPRESSION / ASSESSMENT AND PLAN / ED COURSE  As part of my medical decision making, I reviewed the following data within the [place] notes reviewed and incorporated, Labs reviewed, EKG interpreted, Old chart reviewed, Radiograph reviewed and Notes from prior ED visits     Brittney Holmes was evaluated in Emergency Department on 05/23/2019 for the symptoms described in the history of present illness. She was evaluated in the context of the global COVID-19 pandemic, which necessitated consideration that the patient might be at risk for infection with the SARS-CoV-2 virus that causes COVID-19. Institutional protocols and algorithms that pertain to the evaluation of patients at risk for COVID-19 are in a state of rapid change based on information released by regulatory bodies including the CDC and federal and state organizations. These policies and algorithms were followed during the patient's care in the ED.    24 year old female presenting with fever, myalgias, lower abdominal pain, palpitations.  Differential diagnosis includes but is not limited to sepsis,  community-acquired pneumonia, UTI, endometritis, COVID-19, etc.  Code sepsis initiated on patient's arrival to the treatment room.  30 cc/kg IV fluids ordered.  Rapid Covid antigen is NEGATIVE.  Will send for PCR.  Will obtain septic work-up, CT abdomen/pelvis and reassess.  Tylenol given for fever.  I personally reviewed patient's records and see that she had a televisit with her OB/GYN on 3/9.  She has had elevated BPs with mild intermittent headaches.  She was started on Enalapril 5 mg at that time.   Clinical Course as of May 22 713  Thu May 23, 2019  0518 Patient remains hypertensive.  Started on Enalapril 5 mg on 3/9.  Will give small IV dose for blood pressure control.   [JS]  3244 BP improved; currently 123/88.  Patient is  afebrile but remains tachycardic.  Covid PCR is negative.  Lactic acid unremarkable, procalcitonin is negative, UA unremarkable except for ketones.  Patient drinking oral contrast in preparation for CT scan.   [JS]  0708 Care transferred to Dr. Jimmye Norman at change of shift. CT abd/pel pending.    [JS]    Clinical Course User Index [JS] Paulette Blanch, MD     ____________________________________________   FINAL CLINICAL IMPRESSION(S) / ED DIAGNOSES  Final diagnoses:  Fever, unspecified fever cause  Lower abdominal pain     ED Discharge Orders    None       Note:  This document was prepared using Dragon voice recognition software and may include unintentional dictation errors.   Paulette Blanch, MD 05/23/19 [phone]

## 2019-05-23 NOTE — ED Notes (Signed)
Pt ambulating to toilet in room at this time with no assist. No difficulty noted

## 2019-05-23 NOTE — ED Notes (Signed)
Pt states that she has not eaten or drink "much in the last few days." Pt state she has experienced a decrease in amount of urine produced the last 2 days.

## 2019-05-23 NOTE — ED Notes (Signed)
md states that we will hold abx until we know source of infectionf

## 2019-05-23 NOTE — Progress Notes (Signed)
CODE SEPSIS - PHARMACY COMMUNICATION  **Broad Spectrum Antibiotics should be administered within 1 hour of Sepsis diagnosis**  Time Code Sepsis Called/Page Received: 0410  Antibiotics Ordered: vanc/cefepime/flagyl  Time of 1st antibiotic administration: 0505  Additional action taken by pharmacy: secure chatted RN to educate on giving the broadest abx first (cefepime). Flagyl was the first thing administered in this case.  If necessary, Name of Provider/Nurse Contacted:     Thomasene Ripple ,PharmD Clinical Pharmacist  05/23/2019  5:18 AM

## 2019-05-23 NOTE — ED Notes (Signed)
Pt currently consuming 2nd container of Oral Contrast

## 2019-05-24 LAB — URINE CULTURE: Culture: NO GROWTH

## 2019-05-28 LAB — CULTURE, BLOOD (ROUTINE X 2)
Culture: NO GROWTH
Culture: NO GROWTH
Special Requests: ADEQUATE
Special Requests: ADEQUATE

## 2019-05-31 ENCOUNTER — Encounter: Payer: Self-pay | Admitting: Family Medicine

## 2019-06-11 ENCOUNTER — Encounter: Payer: Self-pay | Admitting: Family Medicine

## 2019-06-17 ENCOUNTER — Encounter: Payer: Self-pay | Admitting: Family Medicine

## 2019-06-17 ENCOUNTER — Telehealth: Payer: Self-pay | Admitting: Family Medicine

## 2019-06-17 NOTE — Telephone Encounter (Signed)
Please call this patient in order to schedule her for a blood pressure check.   Thank you,  Dr. Sharol Harness

## 2019-06-18 NOTE — Telephone Encounter (Signed)
LMOVM for pt to call back and schedule a bp check. Brittney Holmes, CMA

## 2020-03-30 ENCOUNTER — Ambulatory Visit (INDEPENDENT_AMBULATORY_CARE_PROVIDER_SITE_OTHER): Payer: Medicaid Other | Admitting: Family Medicine

## 2020-03-30 ENCOUNTER — Ambulatory Visit (HOSPITAL_COMMUNITY)
Admission: RE | Admit: 2020-03-30 | Discharge: 2020-03-30 | Disposition: A | Discharge status: Home / Self Care | Payer: Medicaid Other | Source: Ambulatory Visit | Attending: Family Medicine | Admitting: Family Medicine

## 2020-03-30 ENCOUNTER — Encounter: Payer: Self-pay | Admitting: Family Medicine

## 2020-03-30 ENCOUNTER — Other Ambulatory Visit: Payer: Self-pay

## 2020-03-30 VITALS — BP 133/91 | HR 138 | Ht 62.0 in | Wt 89.6 lb

## 2020-03-30 DIAGNOSIS — R030 Elevated blood-pressure reading, without diagnosis of hypertension: Secondary | ICD-10-CM

## 2020-03-30 DIAGNOSIS — R002 Palpitations: Secondary | ICD-10-CM | POA: Insufficient documentation

## 2020-03-30 DIAGNOSIS — R000 Tachycardia, unspecified: Secondary | ICD-10-CM | POA: Insufficient documentation

## 2020-03-30 DIAGNOSIS — R5383 Other fatigue: Secondary | ICD-10-CM | POA: Insufficient documentation

## 2020-03-30 NOTE — Patient Instructions (Signed)
Today we will check your blood work to check for anemia, any electrolyte abnormalities or thyroid abnormal levels that could be causing your fatigue and fast heart beat.   I will follow up with you  And refer you to cardiology if your results are normal.   You electrocardiogram did not show any abnormal rhythm today.    Please stay away from caffeine (dark sodas, energy drinks) to see if that helps with your symptoms.   Aim to drink 1 gallon of water per day to remain hydrated.   Follow up with me in 1 week.

## 2020-03-30 NOTE — Progress Notes (Signed)
    SUBJECTIVE:   CHIEF COMPLAINT / HPI: Mood check  Patient reports that since having her son almost a year ago, she has not felt like herself.  Patient states that she often has episodes where she feels short of breath and like her heart is racing and she has central chest pressure. She also reports feeling tired all the time. She states that she has not started any new supplements or medications recently. Denies any syncopal episodes.   PERTINENT  PMH / PSH:  Anemia of Pregnancy   OBJECTIVE:   BP (!) 133/91   Pulse (!) 138   Ht 5\' 2"  (1.575 m)   Wt 89 lb 9.6 oz (40.6 kg)   LMP 03/19/2020 (Exact Date)   SpO2 97%   BMI 16.39 kg/m   General: petite female appearing stated age in no acute distress HEENT: MMM, no oral lesions noted,Neck non-tender without lymphadenopathy Cardio: Normal S1 and S2, no S3 or S4. Rhythm is regular. No murmurs or rubs.  Bilateral radial pulses palpable Pulm: Clear to auscultation bilaterally, no crackles, wheezing, or diminished breath sounds. Normal respiratory effort Abdomen: Bowel sounds normal. Abdomen soft and non-tender.  Extremities: No peripheral edema. Warm & well perfused.   ASSESSMENT/PLAN:   Palpitations Patient is to have elevated heart rate 138 initially.  Heart rate improved on EKG to 92 bpm.  Patient's symptoms concerning for tachycardia and given patient's small stature could be due to dehydration.  Patient noted to have history of microcytic anemia during pregnancy.  Will check CBC to check hemoglobin level.  We will also check for any thyroid study abnormalities with TSH.  We will also check for electrolyte derangements with CMP.  Have patient follow in 2 weeks and consider cardiology referral if symptoms persist or above labs are normal. EKG normal sinus rhythm with no signs of ischemia or abnormal intervals.  03/31/20: Hemoglobin 9 with MCV 74, will restart iron supplementation, discussed with patient via telephone  Elevated blood  pressure reading Patient blood pressure mildly elevated for age at 133/91.  Patient with preeclampsia in prior pregnancy which would raise risk for chronic hypertension. We will follow-up in 2 weeks after initiation of iron supplementation    05/29/20, MD [facility]

## 2020-03-31 ENCOUNTER — Encounter: Payer: Self-pay | Admitting: Family Medicine

## 2020-03-31 DIAGNOSIS — R002 Palpitations: Secondary | ICD-10-CM | POA: Insufficient documentation

## 2020-03-31 DIAGNOSIS — R030 Elevated blood-pressure reading, without diagnosis of hypertension: Secondary | ICD-10-CM | POA: Insufficient documentation

## 2020-03-31 LAB — CBC WITH DIFFERENTIAL/PLATELET
Basophils Absolute: 0.1 10*3/uL (ref 0.0–0.2)
Basos: 1 %
EOS (ABSOLUTE): 0.1 10*3/uL (ref 0.0–0.4)
Eos: 1 %
Hematocrit: 32.6 % — ABNORMAL LOW (ref 34.0–46.6)
Hemoglobin: 9.3 g/dL — ABNORMAL LOW (ref 11.1–15.9)
Immature Grans (Abs): 0 10*3/uL (ref 0.0–0.1)
Immature Granulocytes: 0 %
Lymphocytes Absolute: 2 10*3/uL (ref 0.7–3.1)
Lymphs: 19 %
MCH: 21.1 pg — ABNORMAL LOW (ref 26.6–33.0)
MCHC: 28.5 g/dL — ABNORMAL LOW (ref 31.5–35.7)
MCV: 74 fL — ABNORMAL LOW (ref 79–97)
Monocytes Absolute: 0.4 10*3/uL (ref 0.1–0.9)
Monocytes: 4 %
Neutrophils Absolute: 7.8 10*3/uL — ABNORMAL HIGH (ref 1.4–7.0)
Neutrophils: 75 %
Platelets: 507 10*3/uL — ABNORMAL HIGH (ref 150–450)
RBC: 4.4 x10E6/uL (ref 3.77–5.28)
RDW: 13.9 % (ref 11.7–15.4)
WBC: 10.5 10*3/uL (ref 3.4–10.8)

## 2020-03-31 LAB — COMPREHENSIVE METABOLIC PANEL
ALT: 7 IU/L (ref 0–32)
AST: 16 IU/L (ref 0–40)
Albumin/Globulin Ratio: 1.5 (ref 1.2–2.2)
Albumin: 4.6 g/dL (ref 3.9–5.0)
Alkaline Phosphatase: 87 IU/L (ref 44–121)
BUN/Creatinine Ratio: 8 — ABNORMAL LOW (ref 9–23)
BUN: 5 mg/dL — ABNORMAL LOW (ref 6–20)
Bilirubin Total: 1 mg/dL (ref 0.0–1.2)
CO2: 21 mmol/L (ref 20–29)
Calcium: 9.8 mg/dL (ref 8.7–10.2)
Chloride: 105 mmol/L (ref 96–106)
Creatinine, Ser: 0.6 mg/dL (ref 0.57–1.00)
GFR calc Af Amer: 145 mL/min/{1.73_m2} (ref >59)
GFR calc non Af Amer: 126 mL/min/{1.73_m2} (ref >59)
Globulin, Total: 3.1 g/dL (ref 1.5–4.5)
Glucose: 97 mg/dL (ref 65–99)
Potassium: 3.7 mmol/L (ref 3.5–5.2)
Sodium: 139 mmol/L (ref 134–144)
Total Protein: 7.7 g/dL (ref 6.0–8.5)

## 2020-03-31 LAB — TSH: TSH: 0.953 u[IU]/mL (ref 0.450–4.500)

## 2020-03-31 MED ORDER — FERROUS SULFATE 220 (44 FE) MG/5ML PO ELIX: 220.0000 mg | ORAL_SOLUTION | Freq: Every day | ORAL | 3 refills | Status: DC

## 2020-03-31 NOTE — Assessment & Plan Note (Signed)
Patient blood pressure mildly elevated for age at 133/91.  Patient with preeclampsia in prior pregnancy which would raise risk for chronic hypertension. We will follow-up in 2 weeks after initiation of iron supplementation

## 2020-03-31 NOTE — Assessment & Plan Note (Addendum)
Patient is to have elevated heart rate 138 initially.  Heart rate improved on EKG to 92 bpm.  Patient's symptoms concerning for tachycardia and given patient's small stature could be due to dehydration.  Patient noted to have history of microcytic anemia during pregnancy.  Will check CBC to check hemoglobin level.  We will also check for any thyroid study abnormalities with TSH.  We will also check for electrolyte derangements with CMP.  Have patient follow in 2 weeks and consider cardiology referral if symptoms persist or above labs are normal. EKG normal sinus rhythm with no signs of ischemia or abnormal intervals.  03/31/20: Hemoglobin 9 with MCV 74, will restart iron supplementation, discussed with patient via telephone

## 2020-04-01 ENCOUNTER — Encounter: Payer: Self-pay | Admitting: Family Medicine

## 2020-04-06 ENCOUNTER — Other Ambulatory Visit: Payer: Medicaid Other

## 2020-04-10 ENCOUNTER — Other Ambulatory Visit: Payer: Self-pay

## 2020-04-10 ENCOUNTER — Other Ambulatory Visit: Payer: Medicaid Other

## 2020-04-10 DIAGNOSIS — D509 Iron deficiency anemia, unspecified: Secondary | ICD-10-CM | POA: Diagnosis not present

## 2020-04-11 LAB — FERRITIN: Ferritin: 8 ng/mL — ABNORMAL LOW (ref 15–150)

## 2020-04-13 ENCOUNTER — Encounter: Payer: Self-pay | Admitting: Family Medicine

## 2020-04-13 NOTE — Progress Notes (Deleted)
    SUBJECTIVE:   CHIEF COMPLAINT / HPI: f/u for iron deficiency   Patient found to be severely anemic with ferritin significantly low at 8. Patient has since been started on oral iron therapy but states that she contines to feel unwell reporting symptoms including ***. Patient reports she has been taking the liquid iron as prescribed***   PERTINENT  PMH / PSH:  IDA  OBJECTIVE:   LMP 03/19/2020 (Exact Date)   General: female appearing stated age in no acute distress HEENT: MMM, no oral lesions noted,Neck non-tender without lymphadenopathy, masses or thyromegaly*** Cardio: Normal S1 and S2, no S3 or S4. Rhythm is regular***. No murmurs or rubs.  Bilateral radial pulses palpable Pulm: Clear to auscultation bilaterally, no crackles, wheezing, or diminished breath sounds. Normal respiratory effort, stable on *** Abdomen: Bowel sounds normal. Abdomen soft and non-tender. *** Extremities: No peripheral edema. Warm/ well perfused. *** Neuro: pt alert and oriented x4    ASSESSMENT/PLAN:   No problem-specific Assessment & Plan notes found for this encounter.     Ronnald Ramp, MD [facility]

## 2020-04-14 ENCOUNTER — Ambulatory Visit: Payer: Medicaid Other | Admitting: Family Medicine

## 2020-04-21 ENCOUNTER — Encounter: Payer: Self-pay | Admitting: Family Medicine

## 2020-05-06 ENCOUNTER — Encounter: Payer: Self-pay | Admitting: Family Medicine

## 2020-05-29 ENCOUNTER — Ambulatory Visit: Payer: Medicaid Other | Admitting: Family Medicine

## 2020-10-21 ENCOUNTER — Other Ambulatory Visit: Payer: Self-pay

## 2020-10-21 ENCOUNTER — Encounter: Payer: Self-pay | Admitting: Emergency Medicine

## 2020-10-21 ENCOUNTER — Emergency Department
Admission: EM | Admit: 2020-10-21 | Discharge: 2020-10-21 | Disposition: A | Discharge status: Home / Self Care | Payer: Medicaid Other | Attending: Emergency Medicine | Admitting: Emergency Medicine

## 2020-10-21 DIAGNOSIS — Z28310 Unvaccinated for covid-19: Secondary | ICD-10-CM | POA: Diagnosis not present

## 2020-10-21 DIAGNOSIS — R000 Tachycardia, unspecified: Secondary | ICD-10-CM | POA: Diagnosis not present

## 2020-10-21 DIAGNOSIS — U071 COVID-19: Secondary | ICD-10-CM | POA: Diagnosis not present

## 2020-10-21 DIAGNOSIS — R059 Cough, unspecified: Secondary | ICD-10-CM | POA: Diagnosis present

## 2020-10-21 LAB — RESP PANEL BY RT-PCR (FLU A&B, COVID) ARPGX2
Influenza A by PCR: NEGATIVE
Influenza B by PCR: NEGATIVE
SARS Coronavirus 2 by RT PCR: POSITIVE — AB

## 2020-10-21 MED ORDER — IBUPROFEN 600 MG PO TABS
600.0000 mg | ORAL_TABLET | Freq: Once | ORAL | Status: DC
  Filled 2020-10-21: qty 1

## 2020-10-21 NOTE — ED Notes (Signed)
esignature pad not working 

## 2020-10-21 NOTE — ED Provider Notes (Signed)
[facility] Emergency Department Provider Note  ____________________________________________  Time seen: Approximately 5:41 AM  I have reviewed the triage vital signs and the nursing notes.   HISTORY  Chief Complaint Covid Exposure   HPI Brittney Holmes is a 25 y.o. female presents requesting a COVID test.  Patient reports that she was with a friend today and the friend told her that she tested positive for COVID this evening.  Patient denies any symptoms.  She does report 2 days of sneezing and a mild cough.  She reports that she has really bad allergies and her symptoms are consistent with her allergies.  She denies chest pain, shortness of breath, body aches, fever, vomiting, diarrhea.  Patient is unvaccinated   Past Medical History:  Diagnosis Date   Chlamydia infection 05/31/2013   Cornual ectopic pregnancy 06/18/2016   s/p ELAP, R cornual resection, salpingectomy '[ ]'  Needs serial HCGs until undetectable Needs cesarean deliveries at 36-37 weeks for subsequent pregnancies.   Cornual pregnancy 06/18/2016   s/p ELAP, wedge resection   Hemoperitoneum due to rupture of right cornual ectopic pregnancy 06/18/2016   Mild preeclampsia 10/07/2012   Trichomonas infection 02/14/2014    Patient Active Problem List   Diagnosis Date Noted   Palpitations 03/31/2020   Elevated blood pressure reading 03/31/2020   History of salpingoophorectomy 04/08/2019   Status post cesarean section and left salpingectomy 04/08/2019   GBS (group B Streptococcus carrier), +RV culture, currently pregnant 04/05/2019   Preterm contractions 03/07/2019   Microcytic anemia 02/27/2019   History of pre-eclampsia in prior pregnancy, currently pregnant 01/23/2019   Supervision of high risk pregnancy, antepartum 01/03/2019   At high risk for complications of intrauterine pregnancy (IUP) 09/06/2018   Constipation 11/09/2016   Anxiety 11/09/2016    Past Surgical History:  Procedure Laterality  Date   CESAREAN SECTION N/A 04/08/2019   Procedure: CESAREAN SECTION;  Surgeon: Osborne Oman, MD;  Location: [facility];  Service: Obstetrics;  Laterality: N/A;   LAPAROTOMY  06/18/2016   Procedure: EXPLORATORY LAPAROTOMY, RIGHT CORNEAL WEDGE RESECTION,RIGHT SALPINGECTOMY;  Surgeon: Osborne Oman, MD;  Location: [place] [facility];  Service: Gynecology;;   SALPINGOOPHORECTOMY     rt. per pt   UNILATERAL SALPINGECTOMY Right 04/08/2019   Procedure: UNILATERAL SALPINGECTOMY;  Surgeon: Osborne Oman, MD;  Location: [facility];  Service: Obstetrics;  Laterality: Right;    Prior to Admission medications   Medication Sig Start Date End Date Taking? Authorizing Provider  acetaminophen (TYLENOL) 500 MG tablet Take 1 tablet (500 mg total) by mouth every 6 (six) hours as needed for mild pain or headache. 05/07/19   Fair, Marin Shutter, MD  Blood Pressure Monitoring (BLOOD PRESSURE KIT) DEVI 1 Device by Does not apply route as needed. 01/23/19   [name], Sallyanne Havers, MD  enalapril (VASOTEC) 5 MG tablet Take 1 tablet (5 mg total) by mouth at bedtime. 05/07/19   Fair, Marin Shutter, MD  ferrous sulfate (IRON SUPPLEMENT) 220 (44 Fe) MG/5ML solution Take 5 mLs (220 mg total) by mouth daily. 03/31/20   Simmons-Robinson, Riki Sheer, MD  ibuprofen (ADVIL) 100 MG/5ML suspension Take 30 mLs (600 mg total) by mouth every 6 (six) hours as needed. 04/10/19   Gavin Pound, CNM    Allergies Vancomycin and Feraheme [ferumoxytol]  Family History  Problem Relation Age of Onset   Cancer Maternal Grandmother        lung    Social History Social History   Tobacco Use   Smoking  status: Never   Smokeless tobacco: Never  Vaping Use   Vaping Use: Former   Quit date: 09/12/2018  Substance Use Topics   Alcohol use: No   Drug use: No    Review of Systems  Constitutional: Negative for fever. Eyes: Negative for visual changes. ENT: Negative for sore throat. + congestion Neck: No neck pain  Cardiovascular: Negative for chest  pain. Respiratory: Negative for shortness of breath. + cough Gastrointestinal: Negative for abdominal pain, vomiting or diarrhea. Genitourinary: Negative for dysuria. Musculoskeletal: Negative for back pain. Skin: Negative for rash. Neurological: Negative for headaches, weakness or numbness. Psych: No SI or HI  ____________________________________________   PHYSICAL EXAM:  VITAL SIGNS: ED Triage Vitals  Enc Vitals Group     BP 10/21/20 0233 124/85     Pulse Rate 10/21/20 0233 (!) 127     Resp 10/21/20 0233 16     Temp 10/21/20 0233 98.6 F (37 C)     Temp Source 10/21/20 0233 Oral     SpO2 10/21/20 0233 100 %     Weight 10/21/20 0245 90 lb (40.8 kg)     Height 10/21/20 0245 '5\' 2"'  (1.575 m)     Head Circumference --      Peak Flow --      Pain Score 10/21/20 0245 2     Pain Loc --      Pain Edu? --      Excl. in [place]? --     Constitutional: Alert and oriented. Well appearing and in no apparent distress. HEENT:      Head: Normocephalic and atraumatic.         Eyes: Conjunctivae are normal. Sclera is non-icteric.       Mouth/Throat: Mucous membranes are moist.       Neck: Supple with no signs of meningismus. Cardiovascular: Regular rate and rhythm. No murmurs, gallops, or rubs. 2+ symmetrical distal pulses are present in all extremities. No JVD. Respiratory: Normal respiratory effort. Lungs are clear to auscultation bilaterally.  Gastrointestinal: Soft, non tender, and non distended with positive bowel sounds. No rebound or guarding. Genitourinary: No CVA tenderness. Musculoskeletal:  No edema, cyanosis, or erythema of extremities. Neurologic: Normal speech and language. Face is symmetric. Moving all extremities. No gross focal neurologic deficits are appreciated. Skin: Skin is warm, dry and intact. No rash noted. Psychiatric: Mood and affect are normal. Speech and behavior are normal.  ____________________________________________   LABS (all labs ordered are listed,  but only abnormal results are displayed)  Labs Reviewed  RESP PANEL BY RT-PCR (FLU A&B, COVID) ARPGX2 - Abnormal; Notable for the following components:      Result Value   SARS Coronavirus 2 by RT PCR POSITIVE (*)    All other components within normal limits   ____________________________________________  EKG  none  ____________________________________________  RADIOLOGY  none  ____________________________________________   PROCEDURES  Procedure(s) performed: None Procedures Critical Care performed:  None ____________________________________________   INITIAL IMPRESSION / ASSESSMENT AND PLAN / ED COURSE  25 y.o. female presents requesting a COVID test.  Patient has had symptoms of sneezing, mild cough, and a runny nose for 2 days which she attributed to her environmental allergies.  She is unvaccinated.  She is otherwise extremely well-appearing in no distress with no shortness of breath, no chest pain, normal work of breathing and normal sats both at rest and with ambulation.  Patient was tachycardic upon arrival but then improved without any interventions.  Patient has a history of  tachycardia and has been seen by her family practitioner in January for the same.  Review of epic shows the patient heart rate usually hovers that where it is right now. She denies CP, SOB, palpitations.  Her COVID swab here is positive.  We discussed quarantine, immune system boosting, oxygen monitoring, follow-up with PCP, my standard return precautions.     _____________________________________________ Please note:  Patient was evaluated in Emergency Department today for the symptoms described in the history of present illness. Patient was evaluated in the context of the global COVID-19 pandemic, which necessitated consideration that the patient might be at risk for infection with the SARS-CoV-2 virus that causes COVID-19. Institutional protocols and algorithms that pertain to the evaluation of  patients at risk for COVID-19 are in a state of rapid change based on information released by regulatory bodies including the CDC and federal and state organizations. These policies and algorithms were followed during the patient's care in the ED.  Some ED evaluations and interventions may be delayed as a result of limited staffing during the pandemic.   [place] Controlled Substance Database was reviewed by me. ____________________________________________   FINAL CLINICAL IMPRESSION(S) / ED DIAGNOSES   Final diagnoses:  COVID-19      NEW MEDICATIONS STARTED DURING THIS VISIT:  ED Discharge Orders     None        Note:  This document was prepared using Dragon voice recognition software and may include unintentional dictation errors.    Alfred Levins, [place], MD 10/21/20 [phone]

## 2020-10-21 NOTE — Discharge Instructions (Signed)
To help boost your immune system against COVID-19, please take:  - Vitamin D3 4,000 IU/day - Vitamin C 500-1,000?mg twice a day - Quercetin 250?mg twice a day - Zinc 100?mg/day - Melatonin 10?mg before bedtime (causes drowsiness) - Aspirin 325?mg/day (unless contraindicated) - Pulse Oximeter Monitoring of oxygen saturation is recommended - check your oxygen 3 times a day if less than 90% return to the ER  These medications are all over-the-counter and do not need a prescription.  QUARANTINE INSTRUCTION  Follow these instructions at home:  Protecting others To avoid spreading the illness to other people: Quarantine in your home for 5 days Household members: With no symptoms: who are vaccinated do not need to quarantine but are strongly encouraged to use masks to prevent infection.  With symptoms: should get tested and if positive should quarantine for 5 days. Wash your hands often with soap and water. If soap and water are not available, use an alcohol-based hand sanitizer. If you have not cleaned your hands, do not touch your face. Make sure that all people in your household wash their hands well and often. Cover your nose and mouth when you cough or sneeze. Throw away used tissues. Stay home if you have any cold-like or flu-like symptoms. General instructions Go to your local pharmacy and buy a pulse oximeter (this is a machine that measures your oxygen). Check your oxygen levels at least 3 times a day. If your oxygen level is 90% or less return to the emergency room immediately Take over-the-counter and prescription medicines only as told by your health care provider. If you need medication for fever take tylenol or ibuprofen Drink enough fluid to keep your urine pale yellow. Rest at home as directed by your health care provider. Do not give aspirin to a child with the flu, because of the association with Reye's syndrome. Do not use tobacco products, including cigarettes, chewing  tobacco, and e-cigarettes. If you need help quitting, ask your health care provider. Keep all follow-up visits as told by your health care provider. This is important. How is this prevented? Avoid areas where an outbreak has been reported. Avoid large groups of people. Keep a safe distance from people who are coughing and sneezing. Do not touch your face if you have not cleaned your hands. When you are around people who are sick or might be sick, wear a mask to protect yourself. Contact a health care provider if: You have symptoms of SARS (cough, fever, chest pain, shortness of breath) that are not getting better at home. You have a fever. If you have difficulty breathing go to your local ER or call 911   

## 2020-10-21 NOTE — ED Triage Notes (Signed)
Pt arrived via POV with reports of COVID exposure today, pt states she was around a friend this morning that found out she was positive today. PT states she started having sxs of HA, cough, runny nose, sneezing about 2 days ago.  Wants a covid test.

## 2020-10-22 ENCOUNTER — Telehealth: Payer: Self-pay

## 2020-10-22 NOTE — Telephone Encounter (Signed)
Transition Care Management Unsuccessful Follow-up Telephone Call  Date of discharge and from where:  10/21/2020-[facility]  Attempts:  1st Attempt  Reason for unsuccessful TCM follow-up call:  Unable to reach patient

## 2020-10-25 NOTE — Telephone Encounter (Signed)
Transition Care Management Unsuccessful Follow-up Telephone Call  Date of discharge and from where:  10/21/2020 from [facility]  Attempts:  2nd Attempt  Reason for unsuccessful TCM follow-up call:  Unable to reach patient

## 2020-10-26 NOTE — Telephone Encounter (Signed)
Transition Care Management Unsuccessful Follow-up Telephone Call  Date of discharge and from where:  10/21/2020 [facility] ED  Attempts:  3rd Attempt  Reason for unsuccessful TCM follow-up call:  Unable to reach patient

## 2020-10-31 ENCOUNTER — Emergency Department: Payer: Medicaid Other

## 2020-10-31 ENCOUNTER — Emergency Department
Admission: EM | Admit: 2020-10-31 | Discharge: 2020-10-31 | Disposition: A | Discharge status: Home / Self Care | Payer: Medicaid Other | Attending: Student in an Organized Health Care Education/Training Program | Admitting: Student in an Organized Health Care Education/Training Program

## 2020-10-31 ENCOUNTER — Other Ambulatory Visit: Payer: Self-pay

## 2020-10-31 DIAGNOSIS — D72829 Elevated white blood cell count, unspecified: Secondary | ICD-10-CM | POA: Insufficient documentation

## 2020-10-31 DIAGNOSIS — N12 Tubulo-interstitial nephritis, not specified as acute or chronic: Secondary | ICD-10-CM | POA: Diagnosis not present

## 2020-10-31 DIAGNOSIS — R079 Chest pain, unspecified: Secondary | ICD-10-CM | POA: Diagnosis not present

## 2020-10-31 DIAGNOSIS — R0789 Other chest pain: Secondary | ICD-10-CM | POA: Diagnosis not present

## 2020-10-31 DIAGNOSIS — U071 COVID-19: Secondary | ICD-10-CM | POA: Diagnosis not present

## 2020-10-31 LAB — CBC
HCT: 33.4 % — ABNORMAL LOW (ref 36.0–46.0)
Hemoglobin: 9.6 g/dL — ABNORMAL LOW (ref 12.0–15.0)
MCH: 21.3 pg — ABNORMAL LOW (ref 26.0–34.0)
MCHC: 28.7 g/dL — ABNORMAL LOW (ref 30.0–36.0)
MCV: 74.2 fL — ABNORMAL LOW (ref 80.0–100.0)
Platelets: 421 10*3/uL — ABNORMAL HIGH (ref 150–400)
RBC: 4.5 MIL/uL (ref 3.87–5.11)
RDW: 14.9 % (ref 11.5–15.5)
WBC: 4.9 10*3/uL (ref 4.0–10.5)
nRBC: 0 % (ref 0.0–0.2)

## 2020-10-31 LAB — POC URINE PREG, ED: Preg Test, Ur: NEGATIVE

## 2020-10-31 LAB — TROPONIN I (HIGH SENSITIVITY)
Troponin I (High Sensitivity): 3 ng/L (ref <18)
Troponin I (High Sensitivity): 3 ng/L (ref <18)

## 2020-10-31 LAB — BASIC METABOLIC PANEL
Anion gap: 7 (ref 5–15)
BUN: 9 mg/dL (ref 6–20)
CO2: 26 mmol/L (ref 22–32)
Calcium: 9.4 mg/dL (ref 8.9–10.3)
Chloride: 104 mmol/L (ref 98–111)
Creatinine, Ser: 0.65 mg/dL (ref 0.44–1.00)
GFR, Estimated: >60 mL/min (ref >60)
Glucose, Bld: 104 mg/dL — ABNORMAL HIGH (ref 70–99)
Potassium: 3.5 mmol/L (ref 3.5–5.1)
Sodium: 137 mmol/L (ref 135–145)

## 2020-10-31 LAB — URINALYSIS, COMPLETE (UACMP) WITH MICROSCOPIC
Glucose, UA: NEGATIVE mg/dL
Nitrite: NEGATIVE
Protein, ur: 30 mg/dL — AB
RBC / HPF: >50 RBC/hpf — ABNORMAL HIGH (ref 0–5)
Specific Gravity, Urine: 1.03 (ref 1.005–1.030)
WBC, UA: >50 WBC/hpf — ABNORMAL HIGH (ref 0–5)
pH: 5.5 (ref 5.0–8.0)

## 2020-10-31 LAB — LIPASE, BLOOD: Lipase: 26 U/L (ref 11–51)

## 2020-10-31 LAB — HEPATIC FUNCTION PANEL
ALT: 10 U/L (ref 0–44)
AST: 23 U/L (ref 15–41)
Albumin: 4.1 g/dL (ref 3.5–5.0)
Alkaline Phosphatase: 67 U/L (ref 38–126)
Bilirubin, Direct: 0.1 mg/dL (ref 0.0–0.2)
Indirect Bilirubin: 0.9 mg/dL (ref 0.3–0.9)
Total Bilirubin: 1 mg/dL (ref 0.3–1.2)
Total Protein: 8 g/dL (ref 6.5–8.1)

## 2020-10-31 MED ORDER — CEPHALEXIN 500 MG PO CAPS: 500.0000 mg | ORAL_CAPSULE | Freq: Four times a day (QID) | ORAL | 0 refills | Status: AC

## 2020-10-31 MED ORDER — SODIUM CHLORIDE 0.9 % IV SOLN
1.0000 g | Freq: Once | INTRAVENOUS | Status: AC
  Administered 2020-10-31: 1 g via INTRAVENOUS
  Filled 2020-10-31: qty 10

## 2020-10-31 MED ORDER — IOHEXOL 350 MG/ML SOLN
50.0000 mL | Freq: Once | INTRAVENOUS | Status: AC | PRN
  Administered 2020-10-31: 50 mL via INTRAVENOUS
  Filled 2020-10-31: qty 50

## 2020-10-31 NOTE — Discharge Instructions (Addendum)
Take Keflex four times daily for the next seven days.  

## 2020-10-31 NOTE — ED Provider Notes (Signed)
[facility]-EMERGENCY DEPARTMENT  ____________________________________________  Time seen: Approximately 4:27 PM  I have reviewed the triage vital signs and the nursing notes.   HISTORY  Chief Complaint No chief complaint on file.   Historian Patient     HPI Brittney Holmes is a 25 y.o. female presents to the emergency department with chest heaviness and pain since having COVID-19 10 days ago.  Patient reports that she initially had some nasal congestion and nonproductive cough but she reports that her cough is resolved.  She has been afebrile at home.  Denies vomiting and diarrhea.  She does state that she has shortness of breath with exertion but none at rest.  Patient denies upper back pain.  Denies possibility of pregnancy.  No history of DVT or PE in the past.   Past Medical History:  Diagnosis Date   Chlamydia infection 05/31/2013   Cornual ectopic pregnancy 06/18/2016   s/p ELAP, R cornual resection, salpingectomy '[ ]'  Needs serial HCGs until undetectable Needs cesarean deliveries at 36-37 weeks for subsequent pregnancies.   Cornual pregnancy 06/18/2016   s/p ELAP, wedge resection   Hemoperitoneum due to rupture of right cornual ectopic pregnancy 06/18/2016   Mild preeclampsia 10/07/2012   Trichomonas infection 02/14/2014     Immunizations up to date:  Yes.     Past Medical History:  Diagnosis Date   Chlamydia infection 05/31/2013   Cornual ectopic pregnancy 06/18/2016   s/p ELAP, R cornual resection, salpingectomy '[ ]'  Needs serial HCGs until undetectable Needs cesarean deliveries at 36-37 weeks for subsequent pregnancies.   Cornual pregnancy 06/18/2016   s/p ELAP, wedge resection   Hemoperitoneum due to rupture of right cornual ectopic pregnancy 06/18/2016   Mild preeclampsia 10/07/2012   Trichomonas infection 02/14/2014    Patient Active Problem List   Diagnosis Date Noted   Palpitations 03/31/2020   Elevated blood pressure reading 03/31/2020   History of  salpingoophorectomy 04/08/2019   Status post cesarean section and left salpingectomy 04/08/2019   GBS (group B Streptococcus carrier), +RV culture, currently pregnant 04/05/2019   Preterm contractions 03/07/2019   Microcytic anemia 02/27/2019   History of pre-eclampsia in prior pregnancy, currently pregnant 01/23/2019   Supervision of high risk pregnancy, antepartum 01/03/2019   At high risk for complications of intrauterine pregnancy (IUP) 09/06/2018   Constipation 11/09/2016   Anxiety 11/09/2016    Past Surgical History:  Procedure Laterality Date   CESAREAN SECTION N/A 04/08/2019   Procedure: CESAREAN SECTION;  Surgeon: Osborne Oman, MD;  Location: [facility];  Service: Obstetrics;  Laterality: N/A;   LAPAROTOMY  06/18/2016   Procedure: EXPLORATORY LAPAROTOMY, RIGHT CORNEAL WEDGE RESECTION,RIGHT SALPINGECTOMY;  Surgeon: Osborne Oman, MD;  Location: [place] [facility];  Service: Gynecology;;   SALPINGOOPHORECTOMY     rt. per pt   UNILATERAL SALPINGECTOMY Right 04/08/2019   Procedure: UNILATERAL SALPINGECTOMY;  Surgeon: Osborne Oman, MD;  Location: [facility];  Service: Obstetrics;  Laterality: Right;    Prior to Admission medications   Medication Sig Start Date End Date Taking? Authorizing Provider  cephALEXin (KEFLEX) 500 MG capsule Take 1 capsule (500 mg total) by mouth 4 (four) times daily for 7 days. 10/31/20 11/07/20 Yes Vallarie Mare M, PA-C  acetaminophen (TYLENOL) 500 MG tablet Take 1 tablet (500 mg total) by mouth every 6 (six) hours as needed for mild pain or headache. 05/07/19   Fair, Marin Shutter, MD  Blood Pressure Monitoring (BLOOD PRESSURE KIT) DEVI 1 Device by Does not apply route as needed.  01/23/19   [name], Sallyanne Havers, MD  enalapril (VASOTEC) 5 MG tablet Take 1 tablet (5 mg total) by mouth at bedtime. 05/07/19   Fair, Marin Shutter, MD  ferrous sulfate (IRON SUPPLEMENT) 220 (44 Fe) MG/5ML solution Take 5 mLs (220 mg total) by mouth daily. 03/31/20   Simmons-Robinson, Riki Sheer, MD   ibuprofen (ADVIL) 100 MG/5ML suspension Take 30 mLs (600 mg total) by mouth every 6 (six) hours as needed. 04/10/19   Gavin Pound, CNM    Allergies Vancomycin and Feraheme [ferumoxytol]  Family History  Problem Relation Age of Onset   Cancer Maternal Grandmother        lung    Social History Social History   Tobacco Use   Smoking status: Never   Smokeless tobacco: Never  Vaping Use   Vaping Use: Former   Quit date: 09/12/2018  Substance Use Topics   Alcohol use: No   Drug use: No     Review of Systems  Constitutional: No fever/chills Eyes:  No discharge ENT: No upper respiratory complaints. Respiratory: no cough. No SOB/ use of accessory muscles to breath Gastrointestinal:   No nausea, no vomiting.  No diarrhea.  No constipation. Musculoskeletal: Negative for musculoskeletal pain. Skin: Negative for rash, abrasions, lacerations, ecchymosis.    ____________________________________________   PHYSICAL EXAM:  VITAL SIGNS: ED Triage Vitals  Enc Vitals Group     BP 10/31/20 1533 (!) 113/91     Pulse Rate 10/31/20 1533 (!) 116     Resp 10/31/20 1533 18     Temp 10/31/20 1533 99.1 F (37.3 C)     Temp Source 10/31/20 1533 Oral     SpO2 10/31/20 1533 100 %     Weight 10/31/20 1534 90 lb (40.8 kg)     Height 10/31/20 1534 '5\' 2"'  (1.575 m)     Head Circumference --      Peak Flow --      Pain Score 10/31/20 1534 2     Pain Loc --      Pain Edu? --      Excl. in [place]? --      Constitutional: Alert and oriented. Well appearing and in no acute distress. Eyes: Conjunctivae are normal. PERRL. EOMI. Head: Atraumatic. ENT:      Nose: No congestion/rhinnorhea.      Mouth/Throat: Mucous membranes are moist.  Neck: No stridor.  FROM.  Cardiovascular: Patient tachycardic.  Regular rhythm. Normal S1 and S2.  Good peripheral circulation. Respiratory: Normal respiratory effort without tachypnea or retractions. Lungs CTAB. Good air entry to the bases with no decreased or  absent breath sounds Gastrointestinal: Bowel sounds x 4 quadrants. Soft and nontender to palpation. No guarding or rigidity. No distention. Musculoskeletal: Full range of motion to all extremities. No obvious deformities noted Neurologic:  Normal for age. No gross focal neurologic deficits are appreciated.  Skin:  Skin is warm, dry and intact. No rash noted. Psychiatric: Mood and affect are normal for age. Speech and behavior are normal.   ____________________________________________   LABS (all labs ordered are listed, but only abnormal results are displayed)  Labs Reviewed  BASIC METABOLIC PANEL - Abnormal; Notable for the following components:      Result Value   Glucose, Bld 104 (*)    All other components within normal limits  CBC - Abnormal; Notable for the following components:   Hemoglobin 9.6 (*)    HCT 33.4 (*)    MCV 74.2 (*)    MCH 21.3 (*)  MCHC 28.7 (*)    Platelets 421 (*)    All other components within normal limits  URINALYSIS, COMPLETE (UACMP) WITH MICROSCOPIC - Abnormal; Notable for the following components:   Color, Urine AMBER (*)    APPearance HAZY (*)    Hgb urine dipstick TRACE (*)    Bilirubin Urine SMALL (*)    Ketones, ur TRACE (*)    Protein, ur 30 (*)    Leukocytes,Ua LARGE (*)    RBC / HPF >50 (*)    WBC, UA >50 (*)    Bacteria, UA RARE (*)    All other components within normal limits  URINE CULTURE  HEPATIC FUNCTION PANEL  LIPASE, BLOOD  POC URINE PREG, ED  TROPONIN I (HIGH SENSITIVITY)  TROPONIN I (HIGH SENSITIVITY)   ____________________________________________  EKG   ____________________________________________  RADIOLOGY Unk Pinto, personally viewed and evaluated these images (plain radiographs) as part of my medical decision making, as well as reviewing the written report by the radiologist.  DG Chest 2 View  Result Date: 10/31/2020 CLINICAL DATA:  Pt states that she has been having chest pain x weeks, nonsmoker  EXAM: CHEST - 2 VIEW COMPARISON:  Chest radiograph 05/23/2019 FINDINGS: The cardiomediastinal contours are within normal limits. The lungs are clear. No pneumothorax or pleural effusion. No acute finding in the visualized skeleton. IMPRESSION: No active cardiopulmonary disease. Electronically Signed   By: Audie Pinto M.D.   On: 10/31/2020 16:00   CT Angio Chest PE W and/or Wo Contrast  Result Date: 10/31/2020 CLINICAL DATA:  Chest pain.  Patient recently tested COVID positive. EXAM: CT ANGIOGRAPHY CHEST WITH CONTRAST TECHNIQUE: Multidetector CT imaging of the chest was performed using the standard protocol during bolus administration of intravenous contrast. Multiplanar CT image reconstructions and MIPs were obtained to evaluate the vascular anatomy. CONTRAST:  36m OMNIPAQUE IOHEXOL 350 MG/ML SOLN COMPARISON:  None. FINDINGS: Cardiovascular: Satisfactory opacification of the pulmonary arteries to the segmental level. No evidence of pulmonary embolism. Normal heart size. No pericardial effusion. Mediastinum/Nodes: No enlarged mediastinal, hilar, or axillary lymph nodes. Thyroid gland, trachea, and esophagus demonstrate no significant findings. Lungs/Pleura: Central airways are patent. The lungs are clear. No pneumothorax or pleural effusion. Upper Abdomen: No acute abnormality. Musculoskeletal: No chest wall abnormality. No acute or significant osseous findings. Review of the MIP images confirms the above findings. IMPRESSION: Negative for pulmonary embolus or other acute intrathoracic process. Electronically Signed   By: NAudie PintoM.D.   On: 10/31/2020 17:39    ____________________________________________    PROCEDURES  Procedure(s) performed:     Procedures     Medications  iohexol (OMNIPAQUE) 350 MG/ML injection 50 mL (50 mLs Intravenous Contrast Given 10/31/20 1715)  cefTRIAXone (ROCEPHIN) 1 g in sodium chloride 0.9 % 100 mL IVPB (1 g Intravenous New Bag/Given 10/31/20 1830)      ____________________________________________   INITIAL IMPRESSION / ASSESSMENT AND PLAN / ED COURSE  Pertinent labs & imaging results that were available during my care of the patient were reviewed by me and considered in my medical decision making (see chart for details).      Assessment and plan:  Shortness of breath Chest pain Pyelonephritis.  25year old female presents to the emergency department with anterior chest pain and shortness of breath with exertion.  Patient was tachycardic at triage but vital signs were otherwise reassuring.  On exam, patient was alert, active and nontoxic-appearing with no increased work of breathing.  Anterior chest wall pain was not reproducible  to palpation.  Patient has breath sounds in the lung bases bilaterally with no adventitious lung sounds.  EKG indicated sinus tachycardia without ST segment elevation or other apparent arrhythmia.  Both sets of troponin were within reference range.  CT of the chest was obtained which showed no evidence of PE.  Patient did have a large amount of leukocytes and more than 50 red blood cells in her urine.  Concern for pyelonephritis with atypical presentation.  We will treat patient with IV Rocephin in the emergency department and Keflex p.o. as an outpatient.  Return precautions were given to return to the emergency department with new or worsening symptoms.  All patient questions were answered.    ____________________________________________  FINAL CLINICAL IMPRESSION(S) / ED DIAGNOSES  Final diagnoses:  Other chest pain  Pyelonephritis      NEW MEDICATIONS STARTED DURING THIS VISIT:  ED Discharge Orders          Ordered    cephALEXin (KEFLEX) 500 MG capsule  4 times daily        10/31/20 1932                This chart was dictated using voice recognition software/Dragon. Despite best efforts to proofread, errors can occur which can change the meaning. Any change was purely  unintentional.     Lannie Fields, PA-C 10/31/20 1949    Merlyn Lot, MD 11/01/20 1500

## 2020-10-31 NOTE — ED Triage Notes (Signed)
Pt came in asking for COVID testing for her and her children, when told we do not do COVID testing. She then reports that she wants to check in any way because her kids and her have had chest pains for weeks.

## 2020-10-31 NOTE — ED Triage Notes (Signed)
Pt states that she has been havign chets pain d/t "holding her pee"- pt states it hurts when she pees so she has been holding it- pt has also recently tested positive for covid

## 2020-11-02 LAB — URINE CULTURE: Culture: <10000 — AB

## 2020-11-03 ENCOUNTER — Telehealth: Payer: Self-pay

## 2020-11-03 NOTE — Telephone Encounter (Signed)
Transition Care Management Follow-up Telephone Call Date of discharge and from where: 10/31/2020 from [facility] How have you been since you were released from the hospital? Pt states that she is feeling better and will pick up rx today.  Any questions or concerns? No  Items Reviewed: Did the pt receive and understand the discharge instructions provided? Yes  Medications obtained and verified? Yes  Other? No  Any new allergies since your discharge? No  Dietary orders reviewed? No Do you have support at home? Yes   Functional Questionnaire: (I = Independent and D = Dependent) ADLs: I  Bathing/Dressing- I  Meal Prep- I  Eating- I  Maintaining continence- I  Transferring/Ambulation- I  Managing Meds- I   Follow up appointments reviewed:  PCP Hospital f/u appt confirmed? No   Specialist Hospital f/u appt confirmed? No   Are transportation arrangements needed? No  If their condition worsens, is the pt aware to call PCP or go to the Emergency Dept.? Yes Was the patient provided with contact information for the PCP's office or ED? Yes Was to pt encouraged to call back with questions or concerns? Yes

## 2021-02-06 ENCOUNTER — Encounter: Payer: Self-pay | Admitting: Family Medicine

## 2021-02-08 ENCOUNTER — Ambulatory Visit: Payer: Medicaid Other | Admitting: Family Medicine

## 2021-02-17 ENCOUNTER — Ambulatory Visit: Payer: Medicaid Other | Admitting: Family Medicine

## 2021-03-03 ENCOUNTER — Ambulatory Visit (INDEPENDENT_AMBULATORY_CARE_PROVIDER_SITE_OTHER): Payer: Medicaid Other | Admitting: Family Medicine

## 2021-03-03 DIAGNOSIS — Z91199 Patient's noncompliance with other medical treatment and regimen due to unspecified reason: Secondary | ICD-10-CM

## 2021-03-03 NOTE — Progress Notes (Deleted)
No show

## 2021-03-03 NOTE — Patient Instructions (Signed)
It was nice seeing you today! ° ° ° °Please arrive at least 15 minutes prior to your scheduled appointments. ° °Stay well, °Taje Tondreau, MD °[facility] °[phone]  °

## 2021-03-09 NOTE — Progress Notes (Signed)
No show

## 2021-03-18 ENCOUNTER — Encounter: Payer: Self-pay | Admitting: Family Medicine

## 2021-03-27 ENCOUNTER — Encounter: Payer: Self-pay | Admitting: Family Medicine

## 2021-05-11 IMAGING — US TRANSVAGINAL OB ULTRASOUND
1 series · 15 of 28 positions shown · non-contrast
Comparison: None.

CLINICAL DATA: Positive urine pregnancy test

EXAM:
TRANSVAGINAL OB ULTRASOUND
TECHNIQUE: Transvaginal ultrasound was performed for complete evaluation of the
gestation as well as the maternal uterus, adnexal regions, and
pelvic cul-de-sac.

[Series 1: transvaginal ob ultrasound · 45 acquisitions, 15 frames shown]
[im 1/45]
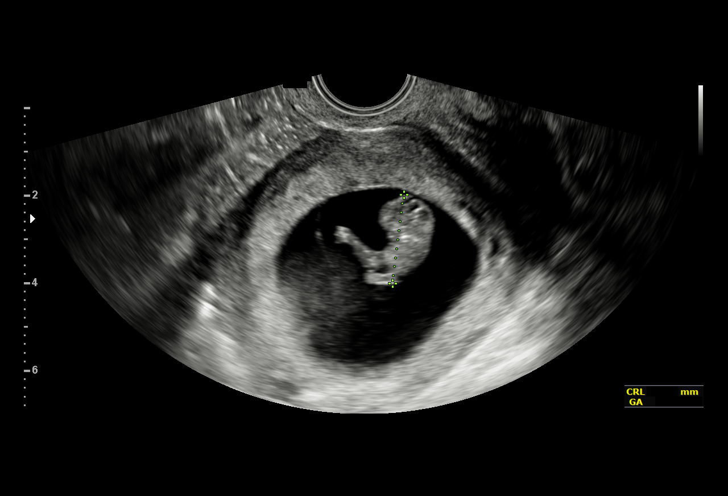
[im 4/45]
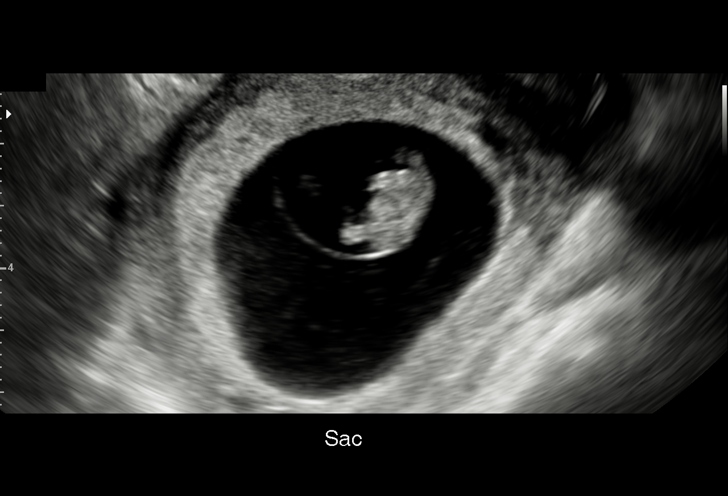
[im 7/45]
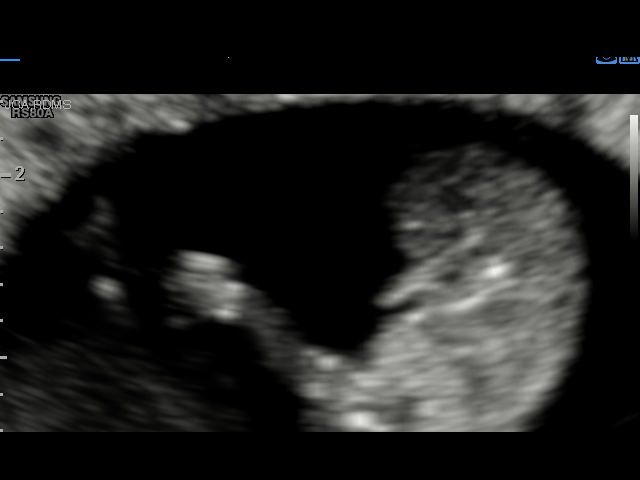
[im 10/45]
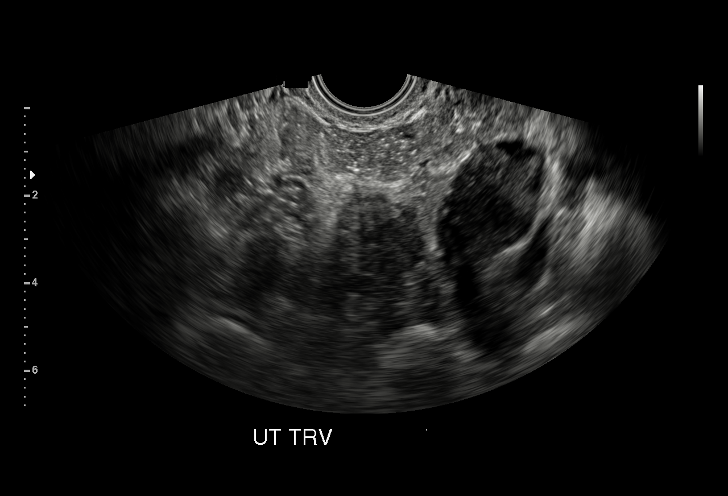
[im 14/45]
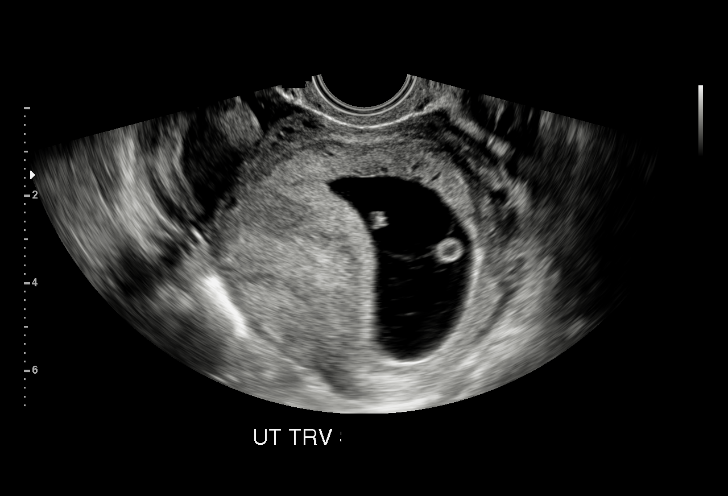
[im 17/45]
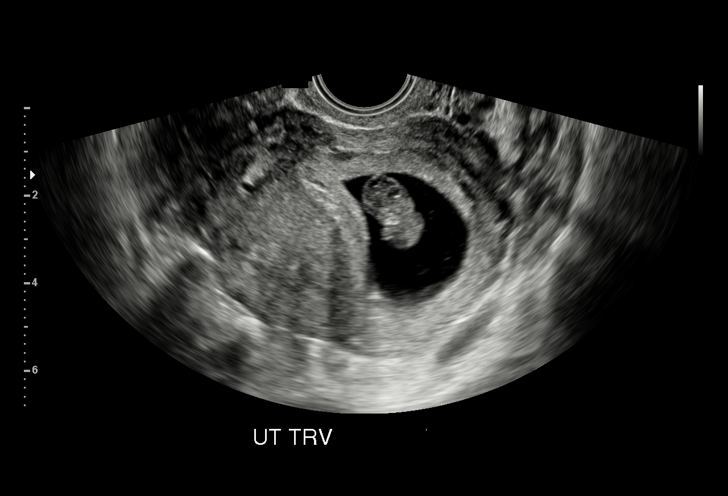
[im 20/45]
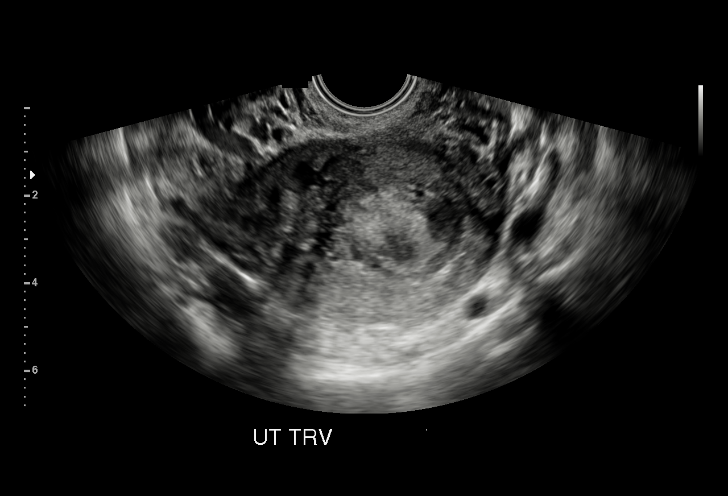
[im 23/45]
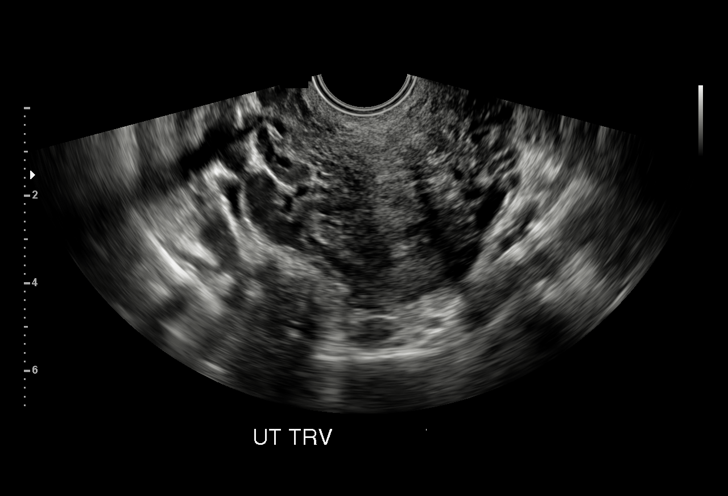
[im 25/45]
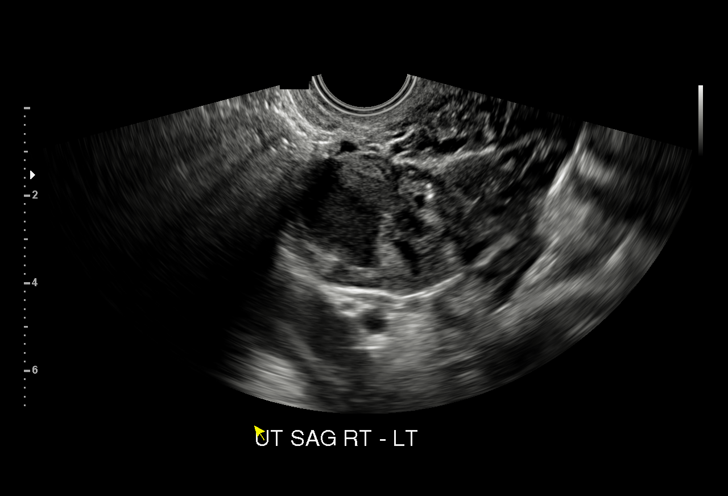
[im 28/45]
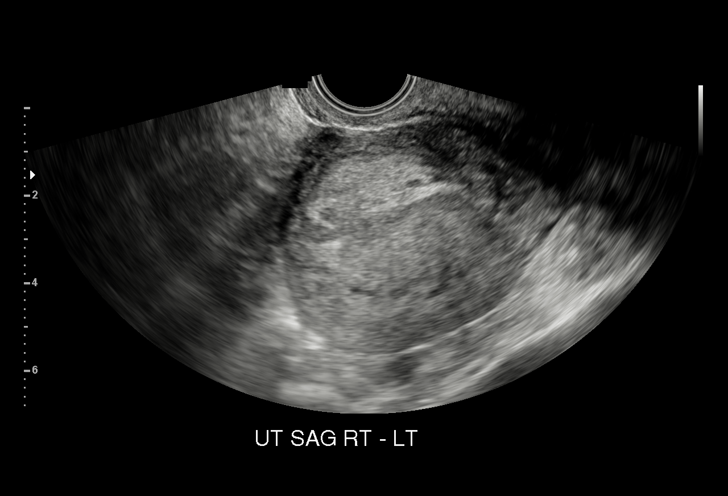
[im 31/45]
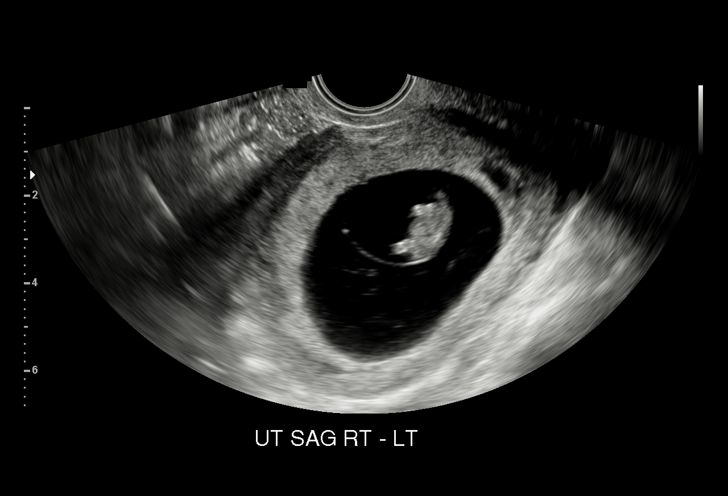
[im 35/45]
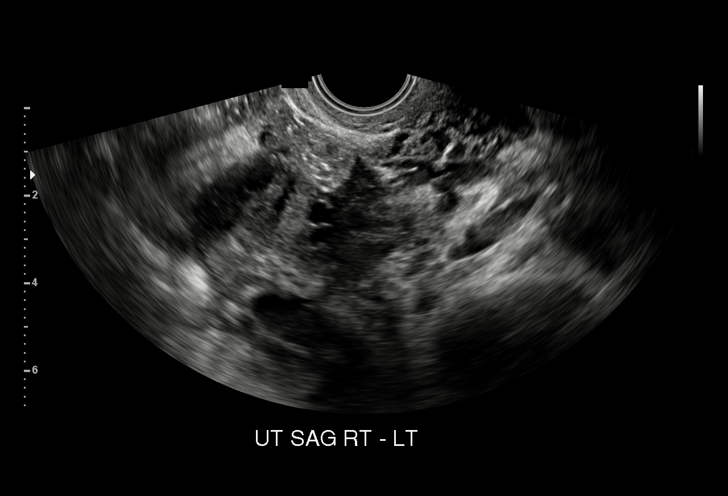
[im 38/45]
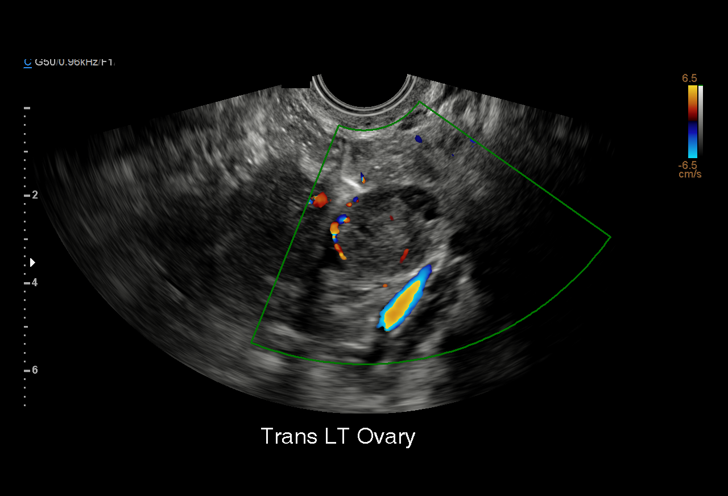
[im 41/45]
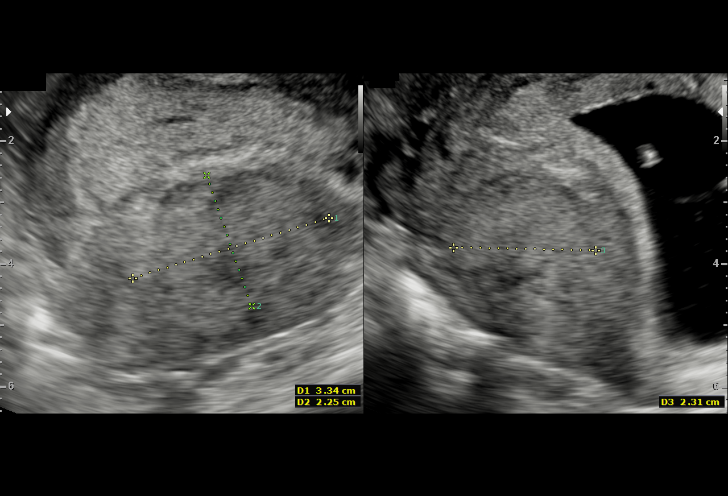
[im 45/45]
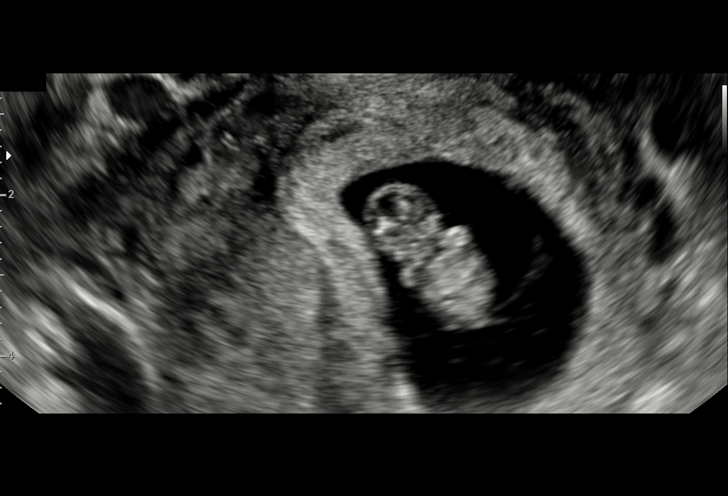

[15 of 28 positions shown; findings below may reference images not displayed]

FINDINGS: Intrauterine gestational sac: Single

Yolk sac:  Visualized

Embryo:  Visualized

Cardiac Activity: Visualized

Heart Rate: 157 bpm

MSD:   mm    w     d

CRL:   21.22 mm   8 w 5 d                  US EDC: 04/28/2019

Subchorionic hemorrhage:  None visualized.

Maternal uterus/adnexae: No adnexal mass or free fluid.
IMPRESSION: Eight week 5 day intrauterine pregnancy. Fetal heart rate 157 beats
per minute. No acute maternal findings.

## 2021-08-03 ENCOUNTER — Encounter: Payer: Self-pay | Admitting: *Deleted

## 2021-09-06 IMAGING — US US MFM OB FOLLOW-UP
1 series · 13 of 28 positions shown · non-contrast
Comparison: none

[Series 1: us mfm ob follow-up · 81 acquisitions, 13 frames shown]
[im 3/81]
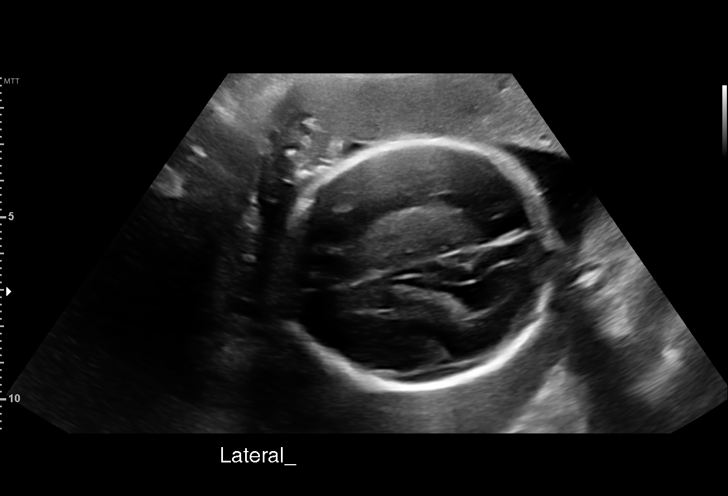
[im 9/81]
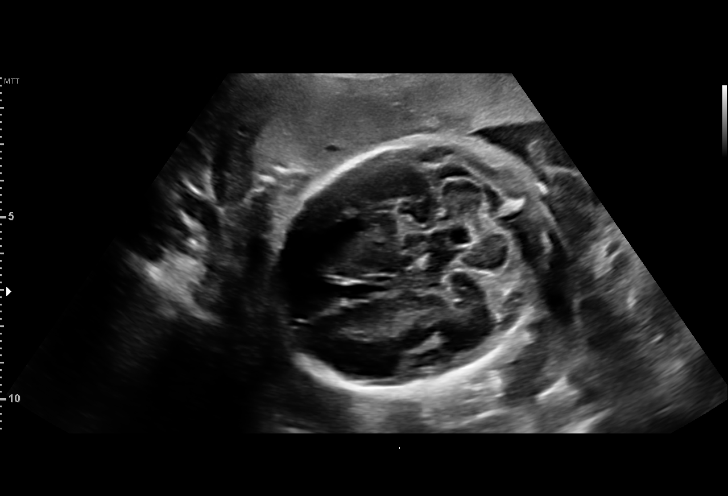
[im 15/81]
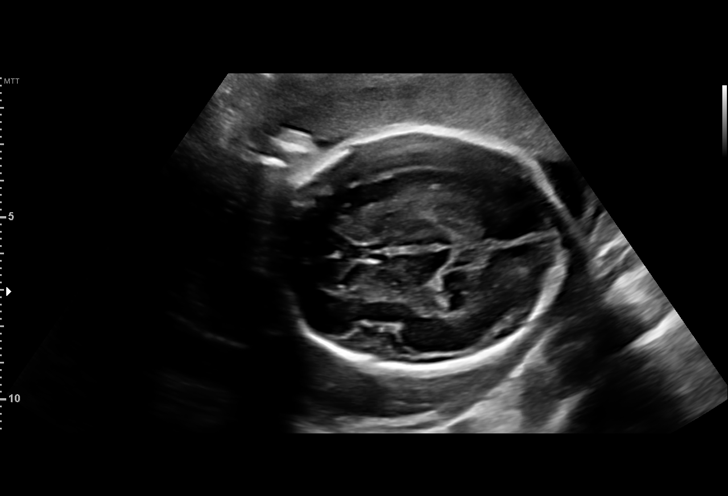
[im 21/81]
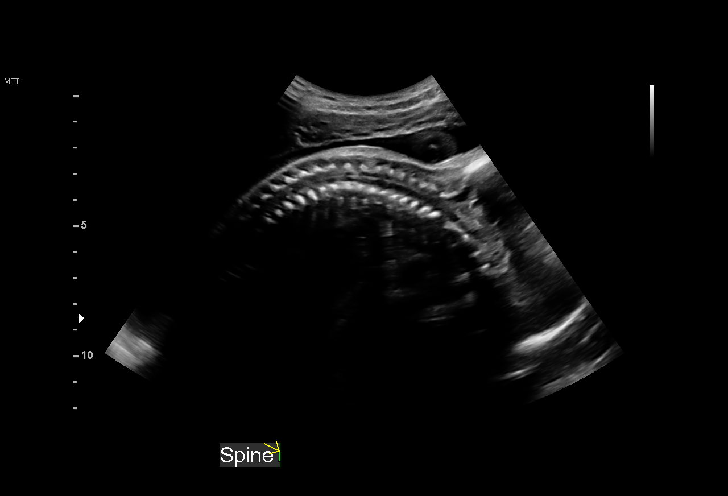
[im 27/81]
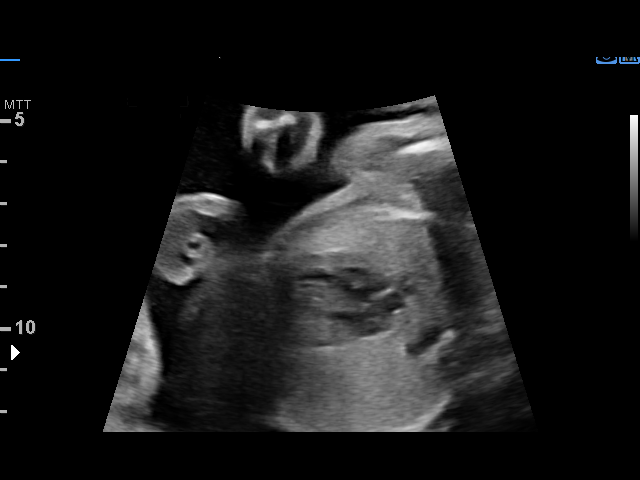
[im 33/81]
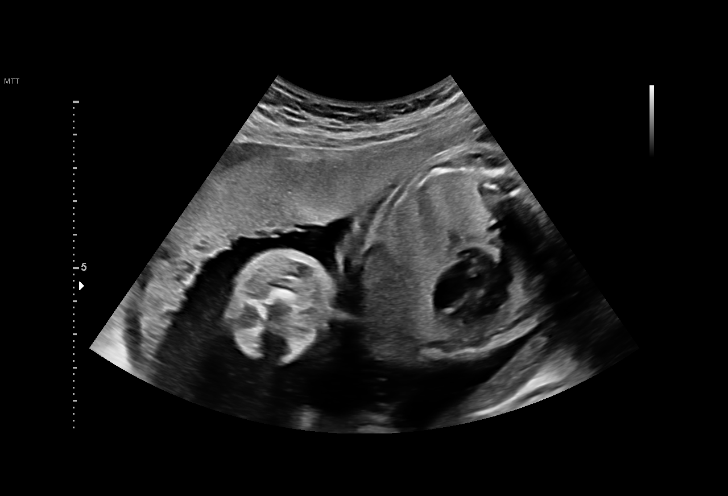
[im 42/81]
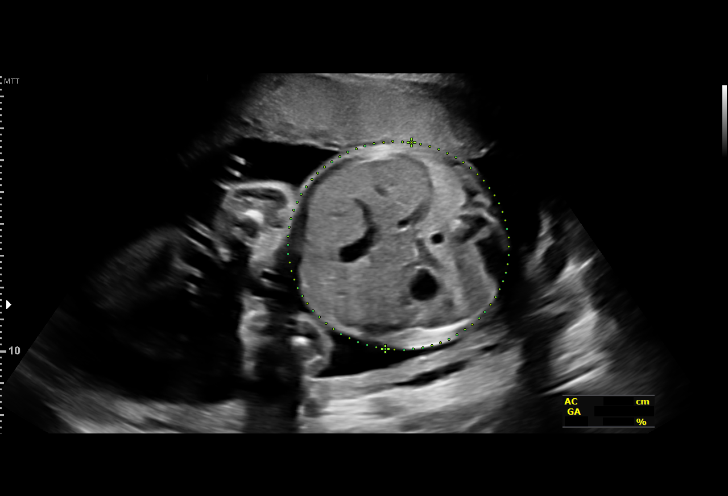
[im 48/81]
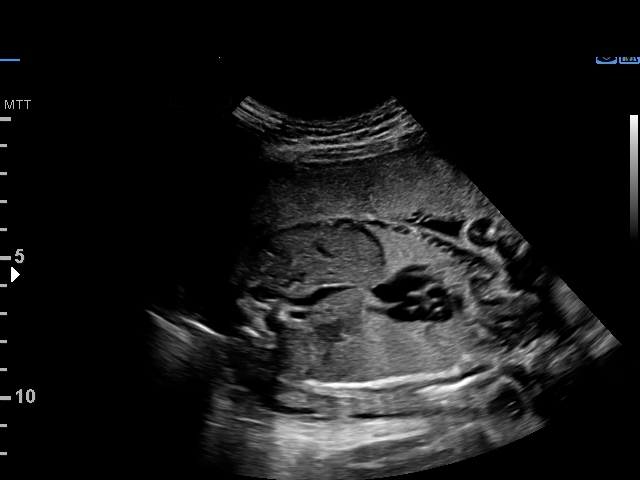
[im 54/81]
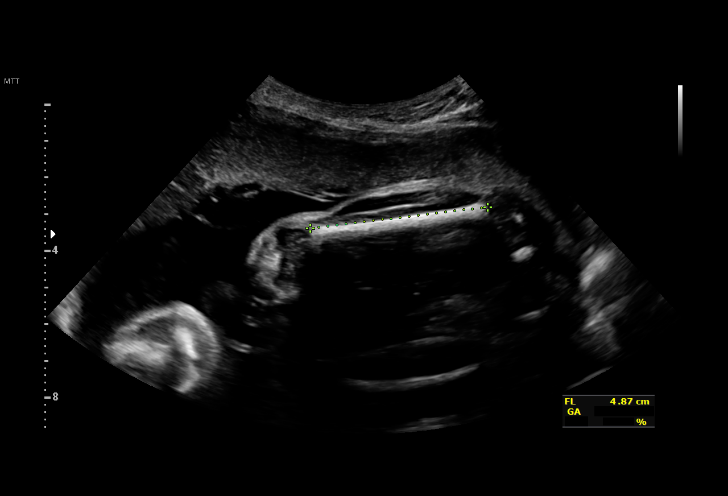
[im 60/81]
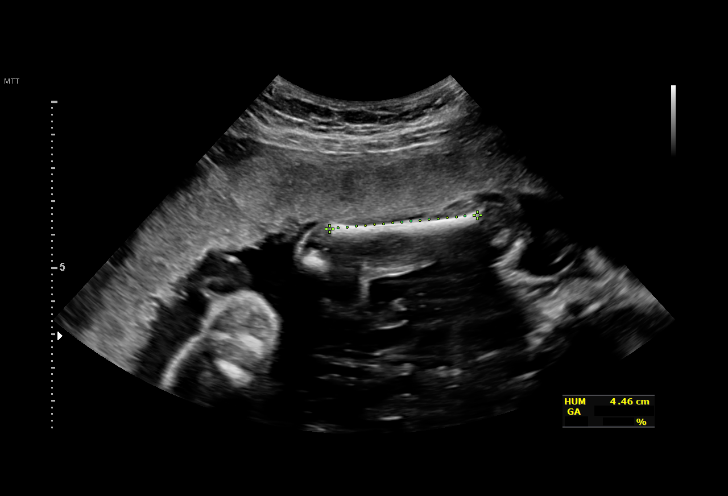
[im 66/81]
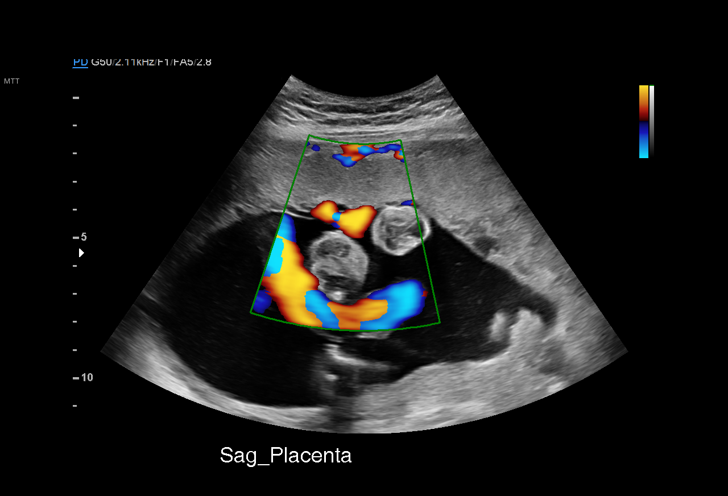
[im 72/81]
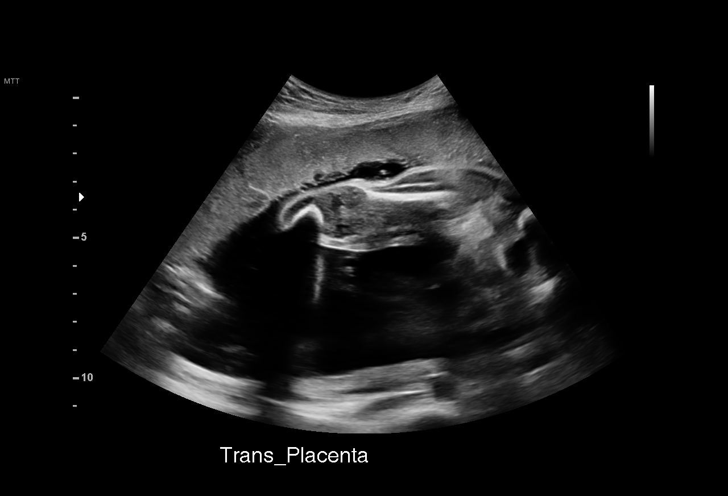
[im 78/81]
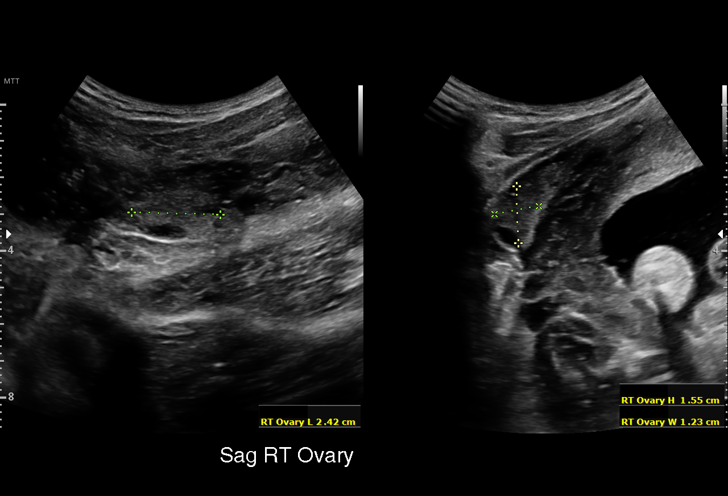

[13 of 28 positions shown; findings below may reference images not displayed]

----------------------------------------------------------------------

 ----------------------------------------------------------------------
Indications

  Poor obstetric history: Previous
  preeclampsia / FGR (0lb51oz at term)
  Uterine scar, other than C/S (cornual ectopic)
  Encounter for antenatal screening for
  malformations (Low Risk NIPS)
  25 weeks gestation of pregnancy
 ----------------------------------------------------------------------
Fetal Evaluation

 Num Of Fetuses:         1
 Fetal Heart Rate(bpm):  122
 Cardiac Activity:       Observed
 Presentation:           Cephalic
 Placenta:               Anterior
 P. Cord Insertion:      Visualized

 Amniotic Fluid
 AFI FV:      Within normal limits

                             Largest Pocket(cm)

Biometry

 BPD:      65.3  mm     G. Age:  26w 3d         69  %    CI:         80.3   %    70 - 86
                                                         FL/HC:      20.6   %    18.6 -
 HC:      230.2  mm     G. Age:  25w 0d         13  %    HC/AC:      1.11        1.04 -
 AC:      206.8  mm     G. Age:  25w 2d         31  %    FL/BPD:     72.6   %    71 - 87
 FL:       47.4  mm     G. Age:  25w 6d         44  %    FL/AC:      22.9   %    20 - 24
 HUM:      43.7  mm     G. Age:  26w 0d         56  %
 CER:      30.5  mm     G. Age:  26w 5d         74  %
 LV:          9  mm

 Est. FW:     818  gm    1 lb 13 oz      36  %
OB History

 Gravidity:    4         Term:   1        Prem:   0        SAB:   1
 TOP:          0       Ectopic:  1        Living: 1
Gestational Age

 LMP:           24w 5d        Date:  07/28/18                 EDD:   05/04/19
 U/S Today:     25w 5d                                        EDD:   04/27/19
 Best:          25w 4d     Det. By:  Early Ultrasound         EDD:   04/28/19
                                     (09/21/18)
Anatomy

 Cranium:               Appears normal         Aortic Arch:            Appears normal
 Cavum:                 Appears normal         Ductal Arch:            Previously seen
 Ventricles:            Appears normal         Diaphragm:              Appears normal
 Choroid Plexus:        Appears normal         Stomach:                Appears normal, left
                                                                       sided
 Cerebellum:            Appears normal         Abdomen:                Appears normal
 Posterior Fossa:       Appears normal         Abdominal Wall:         Appears nml (cord
                                                                       insert, abd wall)
 Nuchal Fold:           Previously seen        Cord Vessels:           Appears normal (3
                                                                       vessel cord)
 Face:                  Orbits previously      Kidneys:                Appear normal
                        seen
 Lips:                  Appears normal         Bladder:                Appears normal
 Thoracic:              Previously seen        Spine:                  Appears normal
 Heart:                 Appears normal         Upper Extremities:      Appears normal
                        (4CH, axis, and
                        situs)
 RVOT:                  Appears normal         Lower Extremities:      Appears normal
 LVOT:                  Appears normal

 Other:  Male gender previously seen. Technically difficult due to fetal position.
Cervix Uterus Adnexa

 Cervix
 Not visualized (advanced GA >85wks)

 Uterus
 No abnormality visualized.

 Left Ovary
 Within normal limits. No adnexal mass visualized.

 Right Ovary
 Within normal limits. No adnexal mass visualized.

 Cul De Sac
 No free fluid seen.

 Adnexa
 No abnormality visualized.
Comments

 This patient was seen for a follow up growth scan due to a
 prior pregnancy that was complicated by an IUGR fetus.  The
 patient reports that she was evaluated in the [facility] yesterday
 due to vaginal bleeding versus rectal bleeding.  She reports
 that the bleeding has now resolved.
 She was informed that the fetal growth and amniotic fluid
 level appears appropriate for her gestational age.
 A follow up exam was scheduled in 4 weeks.

## 2021-11-15 ENCOUNTER — Ambulatory Visit: Payer: Medicaid Other

## 2021-11-21 IMAGING — US US MFM OB FOLLOW-UP
1 series · 13 of 27 positions shown · non-contrast
Comparison: none

[Series 1: us mfm ob follow-up · 13 of 27 slices shown]
[im 2/27]
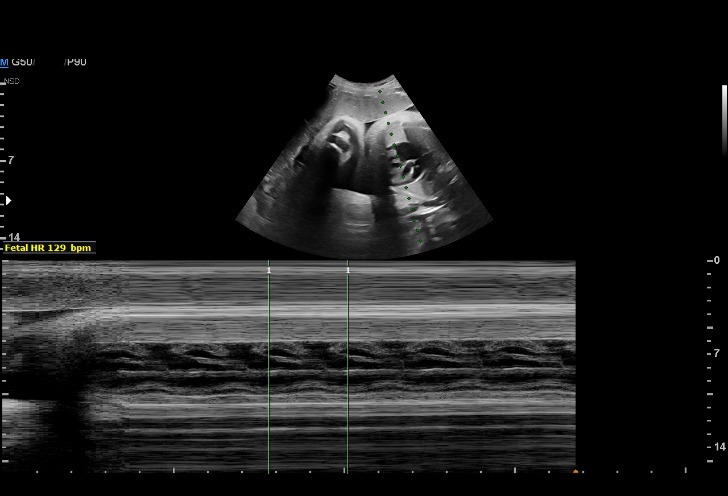
[im 4/27]
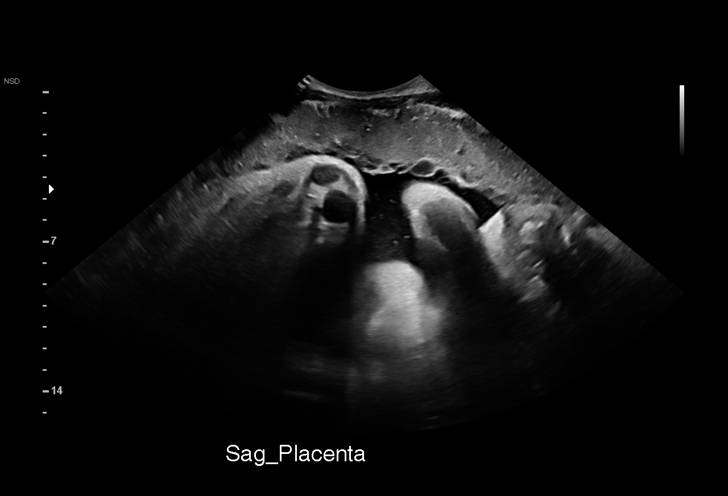
[im 6/27]
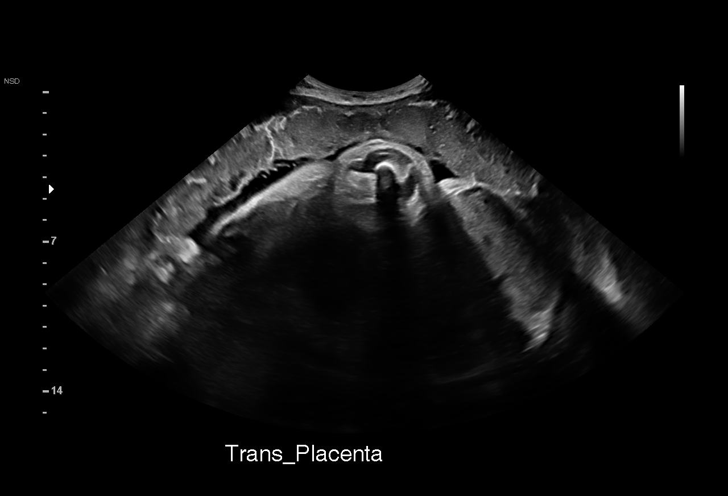
[im 8/27]
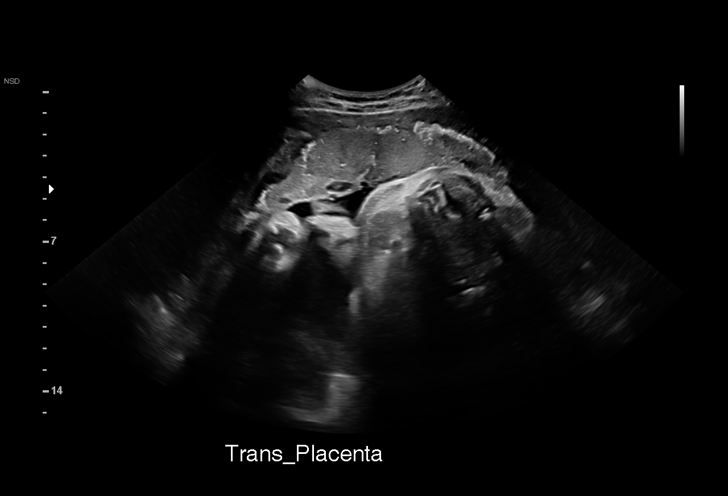
[im 10/27]
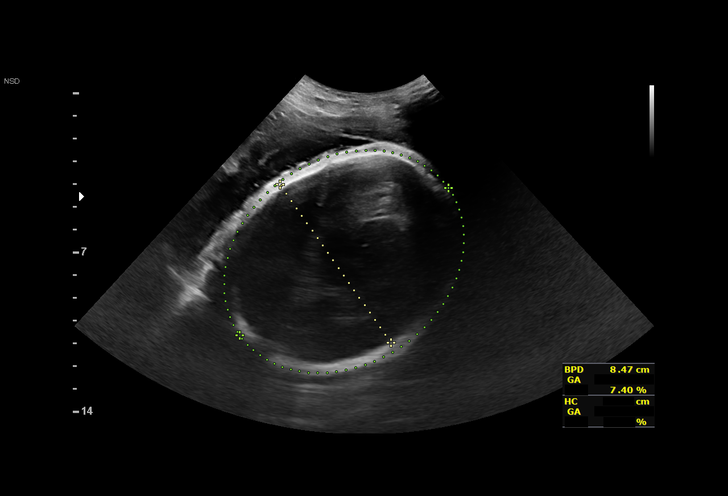
[im 12/27]
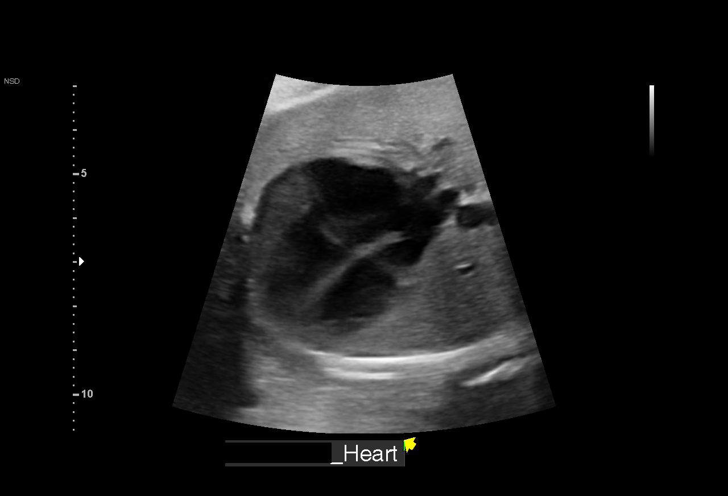
[im 14/27]
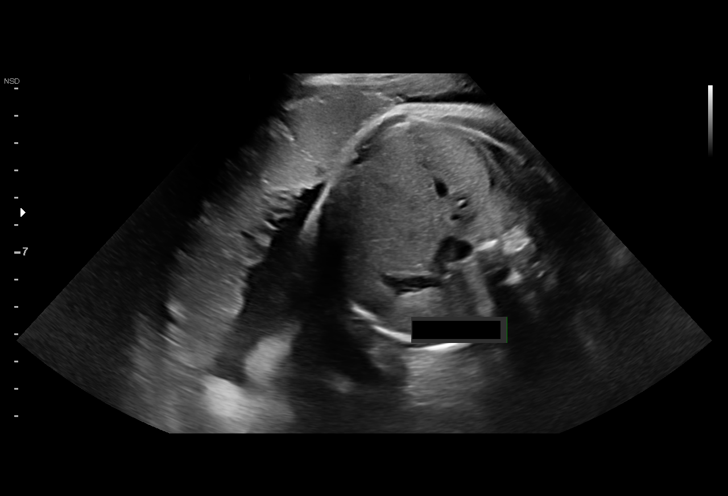
[im 16/27]
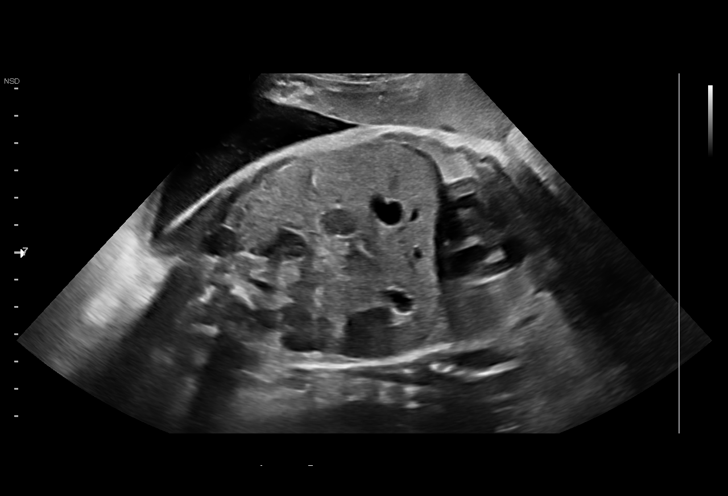
[im 18/27]
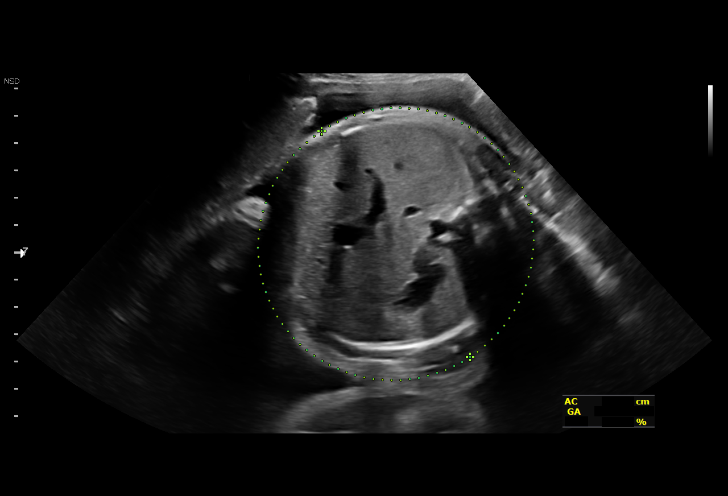
[im 20/27]
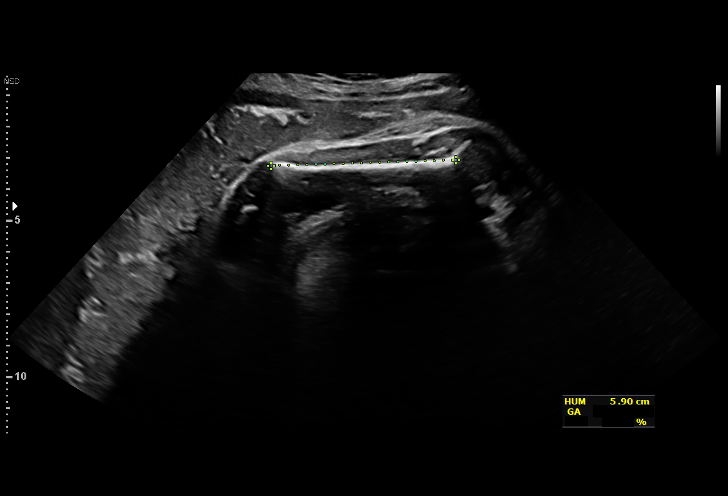
[im 22/27]
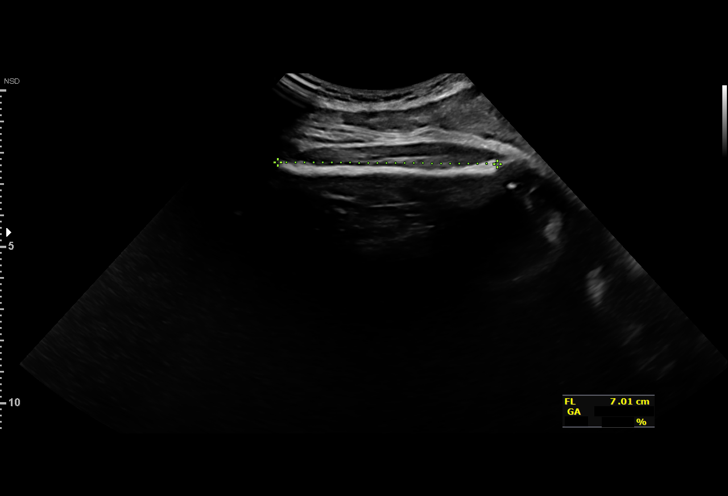
[im 24/27]
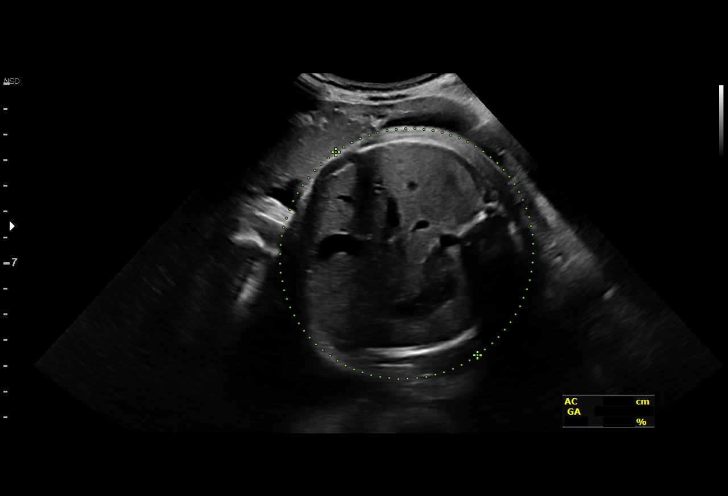
[im 26/27]
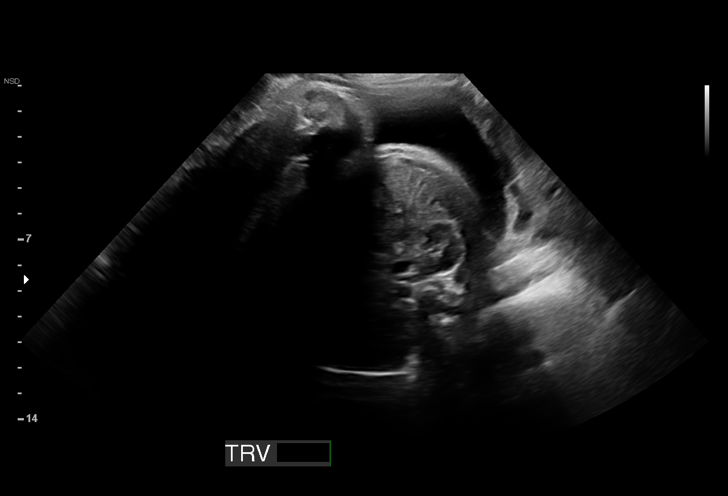

[13 of 27 positions shown; findings below may reference images not displayed]

[address]

 ----------------------------------------------------------------------

 ----------------------------------------------------------------------
Indications

  Poor obstetric history: Previous
  preeclampsia / FGR (7lb43oz at term),
  history of FGR
  36 weeks gestation of pregnancy
  Uterine scar, other than C/S (cornual ectopic)
  Encounter for antenatal screening for
  malformations (Low Risk NIPS)
  Anemia during pregnancy in third trimester
 ----------------------------------------------------------------------
Fetal Evaluation

 Num Of Fetuses:         1
 Fetal Heart Rate(bpm):  129
 Cardiac Activity:       Observed
 Presentation:           Cephalic
 Placenta:               Anterior
 P. Cord Insertion:      Previously Visualized

 Amniotic Fluid
 AFI FV:      Within normal limits

 AFI Sum(cm)     %Tile       Largest Pocket(cm)
 15.73           58

 RUQ(cm)       RLQ(cm)       LUQ(cm)        LLQ(cm)

Biometry

 BPD:      85.6  mm     G. Age:  34w 4d         12  %    CI:        74.04   %    70 - 86
                                                         FL/HC:      22.1   %    20.1 -
 HC:      315.9  mm     G. Age:  35w 3d          7  %    HC/AC:      1.02        0.93 -
 AC:      310.9  mm     G. Age:  35w 0d         22  %    FL/BPD:     81.7   %    71 - 87
 FL:       69.9  mm     G. Age:  35w 6d         32  %    FL/AC:      22.5   %    20 - 24
 HUM:        60  mm     G. Age:  34w 6d         36  %
 Est. FW:    5453  gm    5 lb 13 oz      23  %
OB History

 Gravidity:    4         Term:   1        Prem:   0        SAB:   1
 TOP:          0       Ectopic:  1        Living: 1
Gestational Age

 LMP:           35w 4d        Date:  07/28/18                 EDD:   05/04/19
 U/S Today:     35w 2d                                        EDD:   05/06/19
 Best:          36w 3d     Det. By:  Early Ultrasound         EDD:   04/28/19
                                     (09/21/18)
Anatomy

 Cranium:               Appears normal         Aortic Arch:            Previously seen
 Cavum:                 Previously seen        Ductal Arch:            Previously seen
 Ventricles:            Previously seen        Diaphragm:              Appears normal
 Choroid Plexus:        Previously seen        Stomach:                Appears normal, left
                                                                       sided
 Cerebellum:            Previously seen        Abdomen:                Appears normal
 Posterior Fossa:       Previously seen        Abdominal Wall:         Previously seen
 Nuchal Fold:           Previously seen        Cord Vessels:           Previously seen
 Face:                  Orbits and profile     Kidneys:                Appear normal
                        previously seen
 Lips:                  Previously seen        Bladder:                Appears normal
 Thoracic:              Previously seen        Spine:                  Previously seen
 Heart:                 Appears normal         Upper Extremities:      Previously seen
                        (4CH, axis, and
                        situs)
 RVOT:                  Previously seen        Lower Extremities:      Previously seen
 LVOT:                  Previously seen

 Other:  Male gender previously seen.
Cervix Uterus Adnexa

 Cervix
 Normal appearance by transabdominal scan.

 Left Ovary
 Previously seen.

 Right Ovary
 Previously seen
Comments

 This patient was seen for a follow up growth scan due to a
 history of a prior growth restricted fetus.
 The fetal growth and amniotic fluid level appears appropriate
 for her gestational age.
 Due to her prior surgery for treatment of a cornual ectopic,
 the patient is already scheduled for a cesarean delivery at 37
 weeks (next week).

## 2021-12-16 ENCOUNTER — Other Ambulatory Visit: Payer: Self-pay

## 2021-12-16 ENCOUNTER — Ambulatory Visit (INDEPENDENT_AMBULATORY_CARE_PROVIDER_SITE_OTHER): Payer: Medicaid Other | Admitting: Family Medicine

## 2021-12-16 ENCOUNTER — Other Ambulatory Visit (HOSPITAL_COMMUNITY)
Admission: RE | Admit: 2021-12-16 | Discharge: 2021-12-16 | Disposition: A | Discharge status: Home / Self Care | Payer: Medicaid Other | Source: Ambulatory Visit | Attending: Family Medicine | Admitting: Family Medicine

## 2021-12-16 VITALS — BP 135/80 | HR 111 | Wt 94.4 lb

## 2021-12-16 DIAGNOSIS — Z124 Encounter for screening for malignant neoplasm of cervix: Secondary | ICD-10-CM

## 2021-12-16 DIAGNOSIS — B9689 Other specified bacterial agents as the cause of diseases classified elsewhere: Secondary | ICD-10-CM

## 2021-12-16 DIAGNOSIS — B379 Candidiasis, unspecified: Secondary | ICD-10-CM

## 2021-12-16 DIAGNOSIS — Z1159 Encounter for screening for other viral diseases: Secondary | ICD-10-CM | POA: Diagnosis not present

## 2021-12-16 DIAGNOSIS — Z113 Encounter for screening for infections with a predominantly sexual mode of transmission: Secondary | ICD-10-CM | POA: Insufficient documentation

## 2021-12-16 DIAGNOSIS — N760 Acute vaginitis: Secondary | ICD-10-CM

## 2021-12-16 LAB — POCT WET PREP (WET MOUNT): Clue Cells Wet Prep Whiff POC: POSITIVE

## 2021-12-16 MED ORDER — METRONIDAZOLE 500 MG PO TABS: 500.0000 mg | ORAL_TABLET | Freq: Two times a day (BID) | ORAL | 0 refills | Status: DC

## 2021-12-16 MED ORDER — FLUCONAZOLE 150 MG PO TABS: 150.0000 mg | ORAL_TABLET | Freq: Once | ORAL | 0 refills | Status: AC

## 2021-12-16 NOTE — Patient Instructions (Signed)
It was wonderful to see you today.  Please bring ALL of your medications with you to every visit.   Today we talked about:  -We are checking for sexually transmitted infections including chlamydia, gonorrhea, trichomonas, HIV, syphilis and hepatitis B. I will let you know of the results via MyChart or telephone call. We are also checking for bacterial vaginosis and yeast, which are not sexually transmitted infections.  -If your test is positive for a sexually transmitted infection, it is important that both you and your partner are both treated.  -It is always important to use barrier protection, such as condoms, to help prevent sexually transmitted infections.     Thank you for choosing [facility].   Please call [phone] with any questions about today's appointment.  Please be sure to schedule follow up at the front  desk before you leave today.   Sharion Settler, DO PGY-3 Family Medicine

## 2021-12-16 NOTE — Progress Notes (Signed)
    SUBJECTIVE:   CHIEF COMPLAINT / HPI:   Vaginal Discharge: Patient is a 26 y.o. female presenting with vaginal discharge for several days.  She is sexually active with both female and female partners.  Has a history of BTL and intermittently uses condoms. she is interested in screening for sexually transmitted infections today.  PERTINENT  PMH / PSH: None relevant  OBJECTIVE:   BP 135/80   Pulse (!) 111   Wt 94 lb 6.4 oz (42.8 kg)   SpO2 100%   BMI 17.27 kg/m    General: NAD, pleasant, able to participate in exam Respiratory: Normal effort, no obvious respiratory distress Pelvic: VULVA: normal appearing vulva with no masses, tenderness or lesions, VAGINA: Normal appearing vagina with normal color, no lesions, without discharge present CERVIX: No lesions, no discharge present, friable  Chaperone Deseree Blount, CMA  present for pelvic exam  ASSESSMENT/PLAN:   1. Routine screening for STI (sexually transmitted infection) Swabs and blood tests as below. Patient was given condoms in office today. Oral, anal, and vaginal swabs were collected today. Wet prep did return positive for yeast, BV, and trichomonas.  Rx for metronidazole sent and MyChart message sent. - Cytology - PAP([place]) - POCT Wet Prep [facility]) - Hepatitis B surface antigen - HIV antibody (with reflex) - RPR - Cervicovaginal ancillary only - Cervicovaginal ancillary only  2. Papanicolaou smear - Cytology - PAP([place])  3. Encounter for hepatitis C screening test for low risk patient - Hepatitis C antibody (reflex, frozen specimen)   Sharion Settler, [place]

## 2021-12-17 ENCOUNTER — Other Ambulatory Visit: Payer: Self-pay | Admitting: Family Medicine

## 2021-12-17 ENCOUNTER — Ambulatory Visit: Payer: Self-pay | Admitting: *Deleted

## 2021-12-17 ENCOUNTER — Ambulatory Visit: Payer: Self-pay

## 2021-12-17 ENCOUNTER — Telehealth: Payer: Medicaid Other | Admitting: Family Medicine

## 2021-12-17 ENCOUNTER — Telehealth: Payer: Self-pay

## 2021-12-17 ENCOUNTER — Encounter: Payer: Self-pay | Admitting: Family Medicine

## 2021-12-17 DIAGNOSIS — N760 Acute vaginitis: Secondary | ICD-10-CM

## 2021-12-17 DIAGNOSIS — A599 Trichomoniasis, unspecified: Secondary | ICD-10-CM

## 2021-12-17 DIAGNOSIS — B3731 Acute candidiasis of vulva and vagina: Secondary | ICD-10-CM

## 2021-12-17 DIAGNOSIS — B9689 Other specified bacterial agents as the cause of diseases classified elsewhere: Secondary | ICD-10-CM

## 2021-12-17 MED ORDER — METRONIDAZOLE 0.75 % VA GEL: 1.0000 | Freq: Every day | VAGINAL | 0 refills | Status: AC

## 2021-12-17 NOTE — Progress Notes (Signed)
Pt reports her tests at her family practice were positive for BV, trich and yeast and she was seeking treatment for this. I have told her we do not treat STD's via telehealth and she will need to call the provider who tested her for treatment options. DWB

## 2021-12-17 NOTE — Telephone Encounter (Signed)
Called patient to discuss results. She noted difficulty with taking tablets. Discussed there are no other alternatives to PO medications for her trichomonas infection. Offered for her to take 2g one time of the metronidazole (4 tablets of the 500mg ) instead of the BID 7 day course if this would be easier for her. She seemed amenable to trying. She was also amenable to trying the diflucan pill since it was just one tablet. Discussed there are other alternatives and she could take monistat OTC if she has difficulty with the tablet.   For her BV I have sent in metronidazole gel.   She should be retested in 3 months.

## 2021-12-17 NOTE — Telephone Encounter (Signed)
Patient LVM on nurse line in regards to test results she saw on mychart.  Will forward to ordering provider.

## 2021-12-17 NOTE — Telephone Encounter (Signed)
Chief Complaint: Medication prescribed for trichomonas  Symptoms: N/A Frequency: N/A Pertinent Negatives: Patient denies N/A Disposition: [] ED /[] Urgent Care (no appt availability in office) / [] Appointment(In office/virtual)/ []  [facility]/ [] Home Care/ [] Refused Recommended Disposition /[] [facility]/ [x]  Follow-up with PCP Additional Notes: Patient says the nurse at the PCP office told her to take all 4 pills for trichomonas instead of taking them for 7 days, she asked is it safe. I advised if her provider gave her that information, then follow the advice of the PCP, advised to call the pharmacist for advice as well. Patient verbalized understanding.    Summary: discuss medication   Pt requesting a cb to discuss if it is safe to take a certain type of medication   Pt did not give much details   Please assist further      Reason for Disposition  Caller has medicine question only, adult not sick, AND triager answers question  Answer Assessment - Initial Assessment Questions 1. NAME of MEDICINE: "What medicine(s) are you calling about?"     Medicine prescribed for trichomonas 2. QUESTION: "What is your question?" (e.g., double dose of medicine, side effect)     Is it safe to take all 4 pills at one time instead of over 7 days  Protocols used: Medication Question Call-A-AH

## 2021-12-17 NOTE — Telephone Encounter (Signed)
  Chief Complaint: medication assistance  Symptoms: vaginal discharge with odor Frequency: 2 weeks  Pertinent Negatives: NA Disposition: [] ED /[] Urgent Care (no appt availability in office) / [] Appointment(In office/virtual)/ [x]  [facility]/ [] Home Care/ [] Refused Recommended Disposition /[] [place] [facility]/ []  Follow-up with PCP Additional Notes: pt got prescribed Flagyl for BV and yeast infection and unable to swallow tablet so asking if can be changed to cream or liquid. Pt called PCP office but was unable to send in until tomorrow so pt asking what else she can do. Scheduled virtual UC visit for 1300.   Reason for Disposition  [1] Caller has URGENT medicine question about med that PCP or specialist prescribed AND [2] triager unable to answer question  Answer Assessment - Initial Assessment Questions 1. NAME of MEDICINE: "What medicine(s) are you calling about?"     Flagyl tablets 2. QUESTION: "What is your question?" (e.g., double dose of medicine, side effect)     Needing alternative to pill d/t unable to take tablets  3. PRESCRIBER: "Who prescribed the medicine?" Reason: if prescribed by specialist, call should be referred to that group.     PCP 4. SYMPTOMS: "Do you have any symptoms?" If Yes, ask: "What symptoms are you having?"  "How bad are the symptoms (e.g., mild, moderate, severe)     BV and yeast infection, vaginal discharge with slight odor  Protocols used: Medication Question Call-A-AH

## 2021-12-17 NOTE — Telephone Encounter (Signed)
Pt called in but the line disconnected when she was transferred to me by the agent.  I attempted to call her back without an answer.

## 2021-12-18 ENCOUNTER — Encounter: Payer: Self-pay | Admitting: Student

## 2021-12-18 LAB — HCV INTERPRETATION

## 2021-12-18 LAB — RPR: RPR Ser Ql: REACTIVE — AB

## 2021-12-18 LAB — HCV AB W REFLEX TO QUANT PCR: HCV Ab: NONREACTIVE

## 2021-12-18 LAB — RPR, QUANT+TP ABS (REFLEX)
Rapid Plasma Reagin, Quant: 1:1 {titer} — ABNORMAL HIGH
T Pallidum Abs: NONREACTIVE

## 2021-12-18 LAB — HIV ANTIBODY (ROUTINE TESTING W REFLEX): HIV Screen 4th Generation wRfx: NONREACTIVE

## 2021-12-18 LAB — HEPATITIS B SURFACE ANTIGEN: Hepatitis B Surface Ag: NEGATIVE

## 2021-12-20 ENCOUNTER — Telehealth: Payer: Medicaid Other | Admitting: Family Medicine

## 2021-12-20 ENCOUNTER — Encounter: Payer: Self-pay | Admitting: Student

## 2021-12-20 ENCOUNTER — Other Ambulatory Visit: Payer: Self-pay | Admitting: Family Medicine

## 2021-12-20 DIAGNOSIS — A599 Trichomoniasis, unspecified: Secondary | ICD-10-CM

## 2021-12-20 DIAGNOSIS — A749 Chlamydial infection, unspecified: Secondary | ICD-10-CM

## 2021-12-20 LAB — CERVICOVAGINAL ANCILLARY ONLY
Chlamydia: NEGATIVE
Chlamydia: POSITIVE — AB
Comment: NEGATIVE
Comment: NEGATIVE
Comment: NORMAL
Comment: NORMAL
Neisseria Gonorrhea: NEGATIVE
Neisseria Gonorrhea: POSITIVE — AB

## 2021-12-20 MED ORDER — DOXYCYCLINE MONOHYDRATE 100 MG PO CAPS: 100.0000 mg | ORAL_CAPSULE | Freq: Two times a day (BID) | ORAL | 0 refills | Status: AC

## 2021-12-20 NOTE — Progress Notes (Signed)
Brittney Holmes   Going to call dr that provided medication to discuss concerns. Patient acknowledged agreement and understanding of the plan.

## 2021-12-20 NOTE — Telephone Encounter (Signed)
Called patient to discuss.

## 2021-12-21 ENCOUNTER — Ambulatory Visit: Payer: Medicaid Other

## 2021-12-21 ENCOUNTER — Encounter: Payer: Self-pay | Admitting: Family Medicine

## 2021-12-21 ENCOUNTER — Telehealth: Payer: Self-pay

## 2021-12-21 LAB — CYTOLOGY - PAP
Chlamydia: NEGATIVE
Comment: NEGATIVE
Comment: NORMAL
Diagnosis: NEGATIVE
Diagnosis: REACTIVE
Neisseria Gonorrhea: NEGATIVE

## 2021-12-21 NOTE — Telephone Encounter (Signed)
Patient calls nurse line in regards to Flagyl and Doxycycline.   Patient would like to know if she can take these together. Patient will be crushing up both pills in apple cause due to intolerance to swallow pills.   Will forward.

## 2021-12-22 ENCOUNTER — Encounter: Payer: Self-pay | Admitting: Family Medicine

## 2021-12-23 ENCOUNTER — Ambulatory Visit (INDEPENDENT_AMBULATORY_CARE_PROVIDER_SITE_OTHER): Payer: Medicaid Other

## 2021-12-23 DIAGNOSIS — A549 Gonococcal infection, unspecified: Secondary | ICD-10-CM | POA: Diagnosis present

## 2021-12-23 MED ORDER — ONDANSETRON 4 MG PO TBDP: 4.0000 mg | ORAL_TABLET | Freq: Two times a day (BID) | ORAL | 0 refills | Status: DC | PRN

## 2021-12-23 MED ORDER — CEFTRIAXONE SODIUM 250 MG IJ SOLR
250.0000 mg | Freq: Once | INTRAMUSCULAR | Status: AC
  Administered 2021-12-23: 250 mg via INTRAMUSCULAR

## 2021-12-23 MED ORDER — METRONIDAZOLE 500 MG PO TABS: 2000.0000 mg | ORAL_TABLET | Freq: Once | ORAL | 0 refills | Status: AC

## 2021-12-23 NOTE — Telephone Encounter (Signed)
Routing to prescribing provider.

## 2021-12-27 ENCOUNTER — Encounter: Payer: Self-pay | Admitting: Student

## 2021-12-30 NOTE — Progress Notes (Signed)
Patient in nurse clinic today for STD treatment of gonorrhea.    Patient advised to abstain from sex for 7-10 days after treatment of self and partner.    Administered 250 mg ceftriaxone in RUOQ and 250 mg ceftriaxone in LUOQ per Dr. Vernona Rieger orders. Patient observed for 15 minutes post injection. No signs of adverse reaction. Tolerated injections well.  Patient also verbalized issues with taking metronidazole tablets. Spoke with Dr. Owens Shark. Dr. Owens Shark prescribed Zofran and 4 tablets of metronidazole tablets to be taken at once for treatment of trichomonas.   STD report form fax completed and faxed to [facility] Department at [phone] (STD department).     Talbot Grumbling, RN

## 2022-01-14 DIAGNOSIS — K0889 Other specified disorders of teeth and supporting structures: Secondary | ICD-10-CM | POA: Diagnosis not present

## 2022-01-14 DIAGNOSIS — K047 Periapical abscess without sinus: Secondary | ICD-10-CM | POA: Diagnosis not present

## 2022-01-28 ENCOUNTER — Encounter: Payer: Self-pay | Admitting: Family Medicine

## 2022-01-28 ENCOUNTER — Ambulatory Visit: Payer: Medicaid Other

## 2022-03-15 ENCOUNTER — Ambulatory Visit: Payer: Medicaid Other

## 2022-03-15 ENCOUNTER — Encounter: Payer: Self-pay | Admitting: Student

## 2022-03-16 ENCOUNTER — Other Ambulatory Visit (HOSPITAL_COMMUNITY)
Admission: RE | Admit: 2022-03-16 | Discharge: 2022-03-16 | Disposition: A | Discharge status: Home / Self Care | Payer: Medicaid Other | Source: Ambulatory Visit | Attending: Family Medicine | Admitting: Family Medicine

## 2022-03-16 ENCOUNTER — Ambulatory Visit (INDEPENDENT_AMBULATORY_CARE_PROVIDER_SITE_OTHER): Payer: Medicaid Other | Admitting: Student

## 2022-03-16 VITALS — BP 122/80 | HR 90 | Wt 94.4 lb

## 2022-03-16 DIAGNOSIS — R10819 Abdominal tenderness, unspecified site: Secondary | ICD-10-CM | POA: Diagnosis not present

## 2022-03-16 DIAGNOSIS — Z113 Encounter for screening for infections with a predominantly sexual mode of transmission: Secondary | ICD-10-CM | POA: Insufficient documentation

## 2022-03-16 DIAGNOSIS — Z111 Encounter for screening for respiratory tuberculosis: Secondary | ICD-10-CM

## 2022-03-16 DIAGNOSIS — A64 Unspecified sexually transmitted disease: Secondary | ICD-10-CM

## 2022-03-16 LAB — POCT URINALYSIS DIP (MANUAL ENTRY)
Bilirubin, UA: NEGATIVE
Blood, UA: NEGATIVE
Glucose, UA: NEGATIVE mg/dL
Ketones, POC UA: NEGATIVE mg/dL
Leukocytes, UA: NEGATIVE
Nitrite, UA: NEGATIVE
Protein Ur, POC: 30 mg/dL — AB
Spec Grav, UA: 1.025 (ref 1.010–1.025)
Urobilinogen, UA: 1 E.U./dL
pH, UA: 6.5 (ref 5.0–8.0)

## 2022-03-16 LAB — POCT UA - MICROSCOPIC ONLY
Epithelial cells, urine per micros: >20
RBC, Urine, Miroscopic: NONE SEEN (ref 0–2)
WBC, Ur, HPF, POC: NONE SEEN (ref 0–5)

## 2022-03-16 NOTE — Addendum Note (Signed)
Addended by: [place] Pink on: 03/16/2022 11:35 AM   Modules accepted: Orders

## 2022-03-16 NOTE — Progress Notes (Signed)
Patient is here for a PPD placement.  PPD placed in left forearm @ 11:00 am.  Patient will return 03/18/2022 to have PPD read. Talbot Grumbling, RN

## 2022-03-16 NOTE — Progress Notes (Deleted)
    SUBJECTIVE:   CHIEF COMPLAINT / HPI:   Different smelling discharge.  Partner has herpes   PERTINENT  PMH / PSH: ***  OBJECTIVE:   BP 122/80   Pulse 90   Wt 94 lb 6.4 oz (42.8 kg)   SpO2 100%   BMI 17.27 kg/m  ***   ASSESSMENT/PLAN:   No problem-specific Assessment & Plan notes found for this encounter.     Orvis Brill, [place]    {    This will disappear when note is signed, click to select method of visit    :1}

## 2022-03-16 NOTE — Assessment & Plan Note (Addendum)
27 year old female here for STD screening.  Generally well-appearing, no abnormality seen on exam.  Normal vaginal discharge. Discussed herpes, and that the small bump on her mons pubis is likely secondary to shaving.  She does not have any vesicular bumps consistent with HSV.  I discussed that if she is sexually active with a partner who has HSV, that she should avoid intercourse during an outbreak. -Obtain urinalysis. -Obtain HIV and RPR -Obtained GC/chlamydia, trichomonas, bacterial vaginosis and yeast. Swabbed x 2, in rectum and vagina. -Encouraged safe sex practices

## 2022-03-16 NOTE — Addendum Note (Signed)
Addended by: Talbot Grumbling on: 03/16/2022 11:14 AM   Modules accepted: Orders

## 2022-03-16 NOTE — Addendum Note (Signed)
Addended by: Orvis Brill on: 03/16/2022 11:42 AM   Modules accepted: Orders

## 2022-03-16 NOTE — Patient Instructions (Signed)
It was great seeing you today.  We collected testing today- I will call you if it is abnormal. If your results are normal, I will send you a MyChart message.   If you have any questions or concerns, please feel free to call the clinic.   Have a wonderful day,  Dr. Osiah Haring [facility] [phone]   

## 2022-03-16 NOTE — Progress Notes (Signed)
    SUBJECTIVE:   CHIEF COMPLAINT / HPI:   Brittney Holmes is a 27 year-old female here for STI screening.  Was seen for TB test with RN clinic but then shared that her boyfriend tested positive for a Klebsiella UTI and his doctor told her to come get tested as well. Reports some suprapubic tenderness. Also concerned she has herpes because of a small bump on her labia. Has had some change in discharge odor. No fevers or chills.  Engages in vaginal and anal intercourse. Does not always use protection.  PERTINENT  PMH / PSH: Reviewed  OBJECTIVE:   BP 122/80   Pulse 90   Wt 94 lb 6.4 oz (42.8 kg)   SpO2 100%   BMI 17.27 kg/m   General: Alert and cooperative and appears to be in no acute distress Cardio: Normal S1 and S2, no S3 or S4. Rhythm is regular. No murmurs or rubs.   Pulm: Clear to auscultation bilaterally, no crackles, wheezing, or diminished breath sounds. Normal respiratory effort Abdomen: Bowel sounds normal. Abdomen soft and non-tender.  Extremities: No peripheral edema. Warm/ well perfused.  Strong radial pulses. Neuro: Cranial nerves grossly intact    ASSESSMENT/PLAN:   Screening examination for STD (sexually transmitted disease) 27 year old female here for STD screening.  Generally well-appearing, no abnormality seen on exam.  Normal vaginal discharge. Discussed herpes, and that the small bump on her mons pubis is likely secondary to shaving.  She does not have any vesicular bumps consistent with HSV.  I discussed that if she is sexually active with a partner who has HSV, that she should avoid intercourse during an outbreak. -Obtain urinalysis. -Obtain HIV and RPR -Obtained GC/chlamydia, trichomonas, bacterial vaginosis and yeast. Swabbed x 2, in rectum and vagina. -Encouraged safe sex practices     Orvis Brill, [place]

## 2022-03-17 LAB — CERVICOVAGINAL ANCILLARY ONLY
Bacterial Vaginitis (gardnerella): POSITIVE — AB
Candida Glabrata: NEGATIVE
Candida Vaginitis: NEGATIVE
Chlamydia: NEGATIVE
Chlamydia: NEGATIVE
Comment: NEGATIVE
Comment: NEGATIVE
Comment: NEGATIVE
Comment: NEGATIVE
Comment: NEGATIVE
Comment: NORMAL
Comment: NEGATIVE
Comment: NEGATIVE
Comment: NORMAL
Neisseria Gonorrhea: NEGATIVE
Neisseria Gonorrhea: NEGATIVE
Trichomonas: NEGATIVE
Trichomonas: NEGATIVE

## 2022-03-17 LAB — HIV ANTIBODY (ROUTINE TESTING W REFLEX): HIV Screen 4th Generation wRfx: NONREACTIVE

## 2022-03-17 LAB — RPR: RPR Ser Ql: NONREACTIVE

## 2022-03-18 ENCOUNTER — Ambulatory Visit: Payer: Medicaid Other

## 2022-03-18 ENCOUNTER — Encounter: Payer: Self-pay | Admitting: Student

## 2022-03-18 LAB — URINE CULTURE

## 2022-03-21 MED ORDER — METRONIDAZOLE 500 MG PO TABS: ORAL_TABLET | ORAL | 0 refills | Status: DC

## 2022-03-21 MED ORDER — ONDANSETRON HCL 4 MG PO TABS: 4.0000 mg | ORAL_TABLET | Freq: Three times a day (TID) | ORAL | 0 refills | Status: DC | PRN

## 2022-03-21 NOTE — Telephone Encounter (Signed)
Called pt with BV results. Plan to treat with metronidazole dose x1 plus Zofran (this was prior tx for her in the past due to inability to tolerate 1 week treatment).  Orvis Brill, DO

## 2022-04-11 ENCOUNTER — Encounter: Payer: Self-pay | Admitting: Student

## 2022-05-24 ENCOUNTER — Ambulatory Visit (INDEPENDENT_AMBULATORY_CARE_PROVIDER_SITE_OTHER): Payer: Medicaid Other

## 2022-05-24 ENCOUNTER — Ambulatory Visit: Payer: Medicaid Other

## 2022-05-24 DIAGNOSIS — Z111 Encounter for screening for respiratory tuberculosis: Secondary | ICD-10-CM

## 2022-05-24 NOTE — Progress Notes (Signed)
Patient is here for a PPD placement.  PPD placed in left forearm @ 3:20 pm.  Patient will return 05/26/2022 to have PPD read. Talbot Grumbling, RN

## 2022-05-26 ENCOUNTER — Ambulatory Visit: Payer: Medicaid Other

## 2022-05-26 DIAGNOSIS — Z111 Encounter for screening for respiratory tuberculosis: Secondary | ICD-10-CM

## 2022-05-26 LAB — TB SKIN TEST
Induration: 0 mm
TB Skin Test: NEGATIVE

## 2022-05-26 NOTE — Progress Notes (Signed)
PPD Reading Note PPD read and results entered in [facility]. Result: 0 mm induration. Interpretation: negative Allergic reaction: No

## 2022-07-21 ENCOUNTER — Ambulatory Visit: Payer: Medicaid Other

## 2022-11-11 ENCOUNTER — Encounter: Payer: Self-pay | Admitting: Family Medicine

## 2022-11-11 ENCOUNTER — Ambulatory Visit (INDEPENDENT_AMBULATORY_CARE_PROVIDER_SITE_OTHER): Payer: Medicaid Other | Admitting: Family Medicine

## 2022-11-11 ENCOUNTER — Other Ambulatory Visit (HOSPITAL_COMMUNITY)
Admission: RE | Admit: 2022-11-11 | Discharge: 2022-11-11 | Disposition: A | Discharge status: Home / Self Care | Payer: Medicaid Other | Source: Ambulatory Visit | Attending: Family Medicine | Admitting: Family Medicine

## 2022-11-11 VITALS — BP 122/91 | HR 80 | Temp 98.2°F | Wt 93.0 lb

## 2022-11-11 DIAGNOSIS — Z113 Encounter for screening for infections with a predominantly sexual mode of transmission: Secondary | ICD-10-CM | POA: Diagnosis not present

## 2022-11-11 DIAGNOSIS — N898 Other specified noninflammatory disorders of vagina: Secondary | ICD-10-CM

## 2022-11-11 LAB — POCT WET PREP (WET MOUNT)
Clue Cells Wet Prep Whiff POC: POSITIVE
Trichomonas Wet Prep HPF POC: ABSENT

## 2022-11-11 MED ORDER — METRONIDAZOLE 500 MG PO TABS: 500.0000 mg | ORAL_TABLET | Freq: Two times a day (BID) | ORAL | 0 refills | Status: AC

## 2022-11-11 NOTE — Progress Notes (Signed)
   SUBJECTIVE:   CHIEF COMPLAINT / HPI:   VAGINAL DISCHARGE  Having vaginal discharge for 3 days. Discharge consistency: thin Discharge color: white Medications tried: none With abnormal odor  Recent antibiotic use: no Sex in last month: yes, no condoms. Possible STD exposure: unlikely not, monogamous  Symptoms Fever: no Dysuria:no Urinary frequency: no Vaginal bleeding: no. LMP last week, regular periods Abdomen or Pelvic pain: some lower abd pain Genital sores or ulcers: no. Has noticed 2 bumps in her mons area. Rash: no Pain during sex: no Missed menstrual period: no. S/p BTL.   OBJECTIVE:   BP (!) 122/91   Pulse 80   Temp 98.2 F (36.8 C) (Oral)   Wt 93 lb (42.2 kg)   SpO2 94%   BMI 17.01 kg/m   Gen: well appearing, in NAD GYN:  External genitalia within normal limits, well healing folliculitis.  Vaginal mucosa pink, moist, normal rugae.  Nonfriable cervix without lesions, moderate amount of thick, clear discharge noted on speculum exam, no bleeding. No cervical motion tenderness.   ASSESSMENT/PLAN:   Vaginal discharge Wet prep c/w BV. Obtained STI testing, treat as indicated. Declined HIV, RPR, Hep C testing. Counseled on condom use.    Caro Laroche, DO

## 2022-11-11 NOTE — Progress Notes (Signed)
Pt states she thinks it's a UTI more than an STD  Pt states she keeps having headaches.   Patient states a bump on top and inside of her vagina.

## 2022-11-11 NOTE — Patient Instructions (Addendum)
It was great to see you!  Our plans for today:  - We are checking some labs today, we will release these results to your MyChart. - Make sure to wear condoms every time you have sex for the full time.   Take care and seek immediate care sooner if you develop any concerns.   Dr. Linwood Dibbles

## 2022-11-13 ENCOUNTER — Encounter: Payer: Self-pay | Admitting: Family Medicine

## 2022-11-14 LAB — CERVICOVAGINAL ANCILLARY ONLY
Bacterial Vaginitis (gardnerella): POSITIVE — AB
Candida Glabrata: NEGATIVE
Candida Vaginitis: POSITIVE — AB
Chlamydia: NEGATIVE
Comment: NEGATIVE
Comment: NEGATIVE
Comment: NEGATIVE
Comment: NEGATIVE
Comment: NEGATIVE
Comment: NORMAL
Neisseria Gonorrhea: NEGATIVE
Trichomonas: NEGATIVE

## 2022-11-15 ENCOUNTER — Encounter: Payer: Self-pay | Admitting: Family Medicine

## 2022-11-15 MED ORDER — METRONIDAZOLE 0.75 % EX GEL: CUTANEOUS | 0 refills | Status: DC

## 2022-11-15 MED ORDER — FLUCONAZOLE 150 MG PO TABS: 150.0000 mg | ORAL_TABLET | Freq: Once | ORAL | 0 refills | Status: AC

## 2022-11-15 MED ORDER — CLOTRIMAZOLE 1 % EX CREA: TOPICAL_CREAM | CUTANEOUS | 0 refills | Status: DC

## 2022-11-15 NOTE — Addendum Note (Signed)
Addended by: Caro Laroche on: 11/15/2022 09:12 AM   Modules accepted: Orders

## 2022-11-28 ENCOUNTER — Telehealth: Payer: Self-pay

## 2022-11-28 MED ORDER — CLOTRIMAZOLE 1 % EX CREA: TOPICAL_CREAM | CUTANEOUS | 0 refills | Status: AC

## 2022-11-28 MED ORDER — METRONIDAZOLE 0.75 % EX GEL: CUTANEOUS | 0 refills | Status: AC

## 2022-11-28 NOTE — Telephone Encounter (Signed)
Patient calls nurse line regarding prescriptions for clotrimazole and metrogel.   She reports that she no longer lives in [place] and needs prescriptions to be transferred to [facility] [address].   Called and canceled at [facility] in [place]. Resent to [facility] on [address].   Veronda Prude, RN

## 2022-12-12 ENCOUNTER — Encounter: Payer: Self-pay | Admitting: Family Medicine

## 2023-05-15 ENCOUNTER — Encounter: Payer: Self-pay | Admitting: Family Medicine

## 2023-05-15 ENCOUNTER — Ambulatory Visit (INDEPENDENT_AMBULATORY_CARE_PROVIDER_SITE_OTHER): Admitting: Family Medicine

## 2023-05-15 VITALS — BP 98/56 | HR 106 | Wt 100.0 lb

## 2023-05-15 DIAGNOSIS — R6884 Jaw pain: Secondary | ICD-10-CM

## 2023-05-15 DIAGNOSIS — D509 Iron deficiency anemia, unspecified: Secondary | ICD-10-CM

## 2023-05-15 MED ORDER — FERROUS SULFATE 300 MG/6.8ML PO SOLN: 7.0000 mL | ORAL | 2 refills | Status: AC

## 2023-05-15 NOTE — Progress Notes (Signed)
    SUBJECTIVE:   CHIEF COMPLAINT / HPI:   Started having body aches and neck pain about three days ago. Denies any fevers or other cold symptoms but has been around her family who is sick.   Has also had jaw tightness for the past two weeks, which is worse when she first wakes up. She thinks she grinds her teeth at night. Chews on a lot of ice. Is currently figuring out housing and this has been stressful.   PERTINENT  PMH / PSH:  Anemia  OBJECTIVE:   BP (!) 98/56   Pulse (!) 106   Wt 100 lb (45.4 kg)   LMP 04/25/2023   SpO2 98%   BMI 18.29 kg/m   General: A&O, NAD HEENT: No sign of trauma, EOM grossly intact Cardiac: tachycardic, no m/r/g Respiratory: CTAB, normal WOB, no w/c/r GI: Soft, NTTP, non-distended  Extremities: NTTP, no peripheral edema. Neuro: Normal gait, moves all four extremities appropriately. Psych: Appropriate mood and affect   ASSESSMENT/PLAN:   Assessment & Plan Iron deficiency anemia, unspecified iron deficiency anemia type Suspect patient has iron deficiency given history of same. Patient cannot take pills so ordered solution. Patient reports reaction to IV iron in the past but not oral. - CBC and iron panel today - ferrous sulfate oral solution every other day  - patient to follow up with PCP in 1-2 months  Jaw pain Suspect due to increased stress. Discussed using mouth guard if she is grinding her teeth at night and some exercises to prevent TMJ dysfunction.   Hal Morales, MD [facility]

## 2023-05-15 NOTE — Patient Instructions (Addendum)
 It was great to see you today! Thank you for choosing [facility] for your primary care. Brittney Holmes was seen for jaw pain.  Today we addressed: Your jaw pain. I gave you some exercises you can trial. You can also buy a mouth guard to wear at night at [facility] or [facility].  Your ice eating- I suspect you may have low iron. I ordered an iron study to see but I also want you to start taking the iron solution every other day.  Follow up with your PCP if you are not feeling better before 2 months. Otherwise, plan to see them in 1-2  months.   If you haven't already, sign up for My Chart to have easy access to your labs results, and communication with your primary care physician.  You should return to our clinic as needed.   I recommend that you always bring your medications to each appointment as this makes it easy to ensure you are on the correct medications and helps [place] not miss refills when you need them.  Please arrive 15 minutes before your appointment to ensure smooth check in process.  We appreciate your efforts in making this happen.  Please call the clinic at [phone] if your symptoms worsen or you have any concerns.  Thank you for allowing me to participate in your care, Hal Morales, MD 05/15/2023, 3:10 PM PGY-1, [facility]

## 2023-05-16 ENCOUNTER — Encounter: Payer: Self-pay | Admitting: Family Medicine

## 2023-05-16 LAB — IRON,TIBC AND FERRITIN PANEL
Ferritin: 7 ng/mL — ABNORMAL LOW (ref 15–150)
Iron Saturation: 3 % — CL (ref 15–55)
Iron: 13 ug/dL — ABNORMAL LOW (ref 27–159)
Total Iron Binding Capacity: 428 ug/dL (ref 250–450)
UIBC: 415 ug/dL (ref 131–425)

## 2023-05-16 LAB — CBC
Hematocrit: 30.4 % — ABNORMAL LOW (ref 34.0–46.6)
Hemoglobin: 8.2 g/dL — ABNORMAL LOW (ref 11.1–15.9)
MCH: 18.9 pg — ABNORMAL LOW (ref 26.6–33.0)
MCHC: 27 g/dL — ABNORMAL LOW (ref 31.5–35.7)
MCV: 70 fL — ABNORMAL LOW (ref 79–97)
Platelets: 632 10*3/uL — ABNORMAL HIGH (ref 150–450)
RBC: 4.33 x10E6/uL (ref 3.77–5.28)
RDW: 14.2 % (ref 11.7–15.4)
WBC: 9.8 10*3/uL (ref 3.4–10.8)

## 2023-05-18 ENCOUNTER — Other Ambulatory Visit (HOSPITAL_COMMUNITY): Payer: Self-pay

## 2023-05-18 NOTE — Telephone Encounter (Signed)
 Durward Mallard- I tried to contact the pharmacy regarding the liquid iron, however, I was unable to get through with anyone. Have you received a  PA request? She is unable to take pills.   Thank you for your assistance in this matter.   Veronda Prude, RN

## 2023-05-19 ENCOUNTER — Other Ambulatory Visit (HOSPITAL_COMMUNITY): Payer: Self-pay

## 2023-05-23 NOTE — Progress Notes (Deleted)
    SUBJECTIVE:   CHIEF COMPLAINT / HPI:   *** Brittney Holmes is a 28yo F w/ hx of salpingoophorectomy   STI testing  - contraception:    PERTINENT  PMH / PSH: ***  OBJECTIVE:   LMP 04/25/2023   ***  ASSESSMENT/PLAN:   No problem-specific Assessment & Plan notes found for this encounter.     Brittney Brigham, MD [facility]

## 2023-05-24 ENCOUNTER — Ambulatory Visit: Admitting: Family Medicine

## 2023-05-27 DIAGNOSIS — N898 Other specified noninflammatory disorders of vagina: Secondary | ICD-10-CM | POA: Diagnosis not present

## 2023-05-27 DIAGNOSIS — Z3202 Encounter for pregnancy test, result negative: Secondary | ICD-10-CM | POA: Diagnosis not present

## 2023-05-27 DIAGNOSIS — N760 Acute vaginitis: Secondary | ICD-10-CM | POA: Diagnosis not present

## 2023-05-27 DIAGNOSIS — B9689 Other specified bacterial agents as the cause of diseases classified elsewhere: Secondary | ICD-10-CM | POA: Diagnosis not present

## 2023-08-08 ENCOUNTER — Encounter: Payer: Self-pay | Admitting: *Deleted

## 2023-08-16 ENCOUNTER — Encounter: Payer: Self-pay | Admitting: Student

## 2023-08-16 ENCOUNTER — Ambulatory Visit: Admitting: Student

## 2023-08-16 VITALS — BP 100/62 | HR 79 | Ht 62.0 in | Wt 101.0 lb

## 2023-08-16 DIAGNOSIS — R21 Rash and other nonspecific skin eruption: Secondary | ICD-10-CM

## 2023-08-16 DIAGNOSIS — R350 Frequency of micturition: Secondary | ICD-10-CM | POA: Diagnosis not present

## 2023-08-16 LAB — POCT URINALYSIS DIP (MANUAL ENTRY)
Bilirubin, UA: NEGATIVE
Blood, UA: NEGATIVE
Glucose, UA: NEGATIVE mg/dL
Ketones, POC UA: NEGATIVE mg/dL
Nitrite, UA: POSITIVE — AB
Protein Ur, POC: 30 mg/dL — AB
Spec Grav, UA: >=1.03 — AB (ref 1.010–1.025)
Urobilinogen, UA: 1 U/dL
pH, UA: 5.5 (ref 5.0–8.0)

## 2023-08-16 LAB — POCT UA - MICROSCOPIC ONLY: RBC, Urine, Miroscopic: NONE SEEN (ref 0–2)

## 2023-08-16 MED ORDER — CEFADROXIL 250 MG/5ML PO SUSR
500.0000 mg | Freq: Two times a day (BID) | ORAL | 0 refills | Status: AC
Start: 2023-08-16 — End: 2023-08-21

## 2023-08-16 MED ORDER — CEFADROXIL 500 MG PO CAPS
500.0000 mg | ORAL_CAPSULE | Freq: Two times a day (BID) | ORAL | 0 refills | Status: DC
Start: 2023-08-16 — End: 2023-08-16

## 2023-08-16 NOTE — Progress Notes (Signed)
    SUBJECTIVE:   CHIEF COMPLAINT / HPI:   Rash Patient developed a nonitchy red rash on the upper aspect of her right thigh over the past week or 2.  No known exposure.  Not painful.  Does seem to be resolving on its own.  The only possible exposure that emerged over the course of her history is that she has been using scented toilet tissue.  She does have a tattoo in this area but tells me that the tattoo has been there for a while.  Urinary Urgency  Frequency  Discoloration Present for several days.  No dysuria.  No fevers, no abdominal pain, no flank pain.  OBJECTIVE:   BP 100/62   Pulse 79   Ht 5' 2 (1.575 m)   Wt 101 lb (45.8 kg)   SpO2 96%   BMI 18.47 kg/m   General: alert & oriented, no apparent distress, well groomed HEENT: normocephalic, atraumatic, EOM grossly intact, oral mucosa moist, neck supple Respiratory: normal respiratory effort GI: non-distended Psych: appropriate mood and affect Skin: Scattered round hyperpigmented macules across upper right thigh consistent with postinflammatory hyperpigmentation. Area of involvement correlates with rash that she shows a photo of. See below.       ASSESSMENT/PLAN:   Assessment & Plan Urinary frequency UA with nitrites and leuks, 10-20 epithelial cells on microscopy.  However, given description of symptoms and UA findings, will treat as a true infection.  Will treat with cefadroxil 500 mg twice daily for 5 days.  She is unable to swallow pills, therefore we will send in suspension.  Rash Appears to have resolved.  There is some residual postinflammatory hyperpigmentation.  I am not entirely sure what would have caused this rash but what ever it was, it seems to be resolved now.  Could consider discontinuation of scented toilet tissue in case this was a contact exposure dermatitis.     Alexa Andrews, MD [facility]

## 2023-08-16 NOTE — Patient Instructions (Addendum)
 Brittney Holmes,  I don't know what caused your rash, but it seems to be healed now. The dark skin discoloration will fade with time.   It looks like you do have a UTI, we will treat this with an antibiotic twice daily for the next five days.   Alexa Andrews, MD

## 2023-11-13 ENCOUNTER — Encounter: Payer: Self-pay | Admitting: Family Medicine

## 2023-11-13 ENCOUNTER — Other Ambulatory Visit (HOSPITAL_COMMUNITY)
Admission: RE | Admit: 2023-11-13 | Discharge: 2023-11-13 | Disposition: A | Discharge status: Home / Self Care | Source: Ambulatory Visit | Attending: Family Medicine | Admitting: Family Medicine

## 2023-11-13 ENCOUNTER — Ambulatory Visit (INDEPENDENT_AMBULATORY_CARE_PROVIDER_SITE_OTHER): Admitting: Family Medicine

## 2023-11-13 VITALS — BP 124/74 | HR 99 | Ht 62.0 in | Wt 99.0 lb

## 2023-11-13 DIAGNOSIS — K219 Gastro-esophageal reflux disease without esophagitis: Secondary | ICD-10-CM

## 2023-11-13 DIAGNOSIS — R079 Chest pain, unspecified: Secondary | ICD-10-CM | POA: Diagnosis not present

## 2023-11-13 DIAGNOSIS — R350 Frequency of micturition: Secondary | ICD-10-CM

## 2023-11-13 DIAGNOSIS — Z113 Encounter for screening for infections with a predominantly sexual mode of transmission: Secondary | ICD-10-CM | POA: Diagnosis not present

## 2023-11-13 LAB — POCT URINALYSIS DIP (MANUAL ENTRY)
Bilirubin, UA: NEGATIVE
Blood, UA: NEGATIVE
Glucose, UA: NEGATIVE mg/dL
Nitrite, UA: POSITIVE — AB
Protein Ur, POC: 30 mg/dL — AB
Spec Grav, UA: 1.025 (ref 1.010–1.025)
Urobilinogen, UA: 2 U/dL — AB
pH, UA: 6.5 (ref 5.0–8.0)

## 2023-11-13 LAB — POCT UA - MICROSCOPIC ONLY: RBC, Urine, Miroscopic: NONE SEEN (ref 0–2)

## 2023-11-13 LAB — POCT URINE PREGNANCY: Preg Test, Ur: NEGATIVE

## 2023-11-13 MED ORDER — FAMOTIDINE 40 MG/5ML PO SUSR: 20.0000 mg | Freq: Two times a day (BID) | ORAL | 0 refills | Status: AC

## 2023-11-13 NOTE — Patient Instructions (Addendum)
 Thank you for coming in today! Here is a summary of what we discussed:  -Try pepcid  twice daily for your reflux symptoms   We are checking some labs today. If they are abnormal, I will call you. If they are normal, I will send you a MyChart message (if it is active) or a letter in the mail. If you do not hear about your labs in the next 2 weeks, please call the office.   Please call the clinic at [phone] if your symptoms worsen or you have any concerns.  Best, Dr Adele

## 2023-11-13 NOTE — Progress Notes (Signed)
    SUBJECTIVE:   CHIEF COMPLAINT / HPI:   STI screening --new partner --urinary frequency --no discharge, itching, dysuria --LMP end of August --last sexual activity over the weekend -- Had tubal ligation 4 years ago -- Requests pregnancy test today  ?Breast concern --pain at upper L breast and chest area that lasted for 1 week, has resolved --Felt sharp and like a pressure --Also describes acid reflux symptoms --no nipple discharge --no Fhx breast cancer  PERTINENT  PMH / PSH: Reviewed  OBJECTIVE:   BP 124/74   Pulse 99   Ht 5' 2 (1.575 m)   Wt 99 lb (44.9 kg)   SpO2 99%   BMI 18.11 kg/m   General: Awake and conversant, no acute distress Respiratory: Normal work of breathing on room air GU (Desiree Blount, CMA present): Normal external female genitalia, thick white discharge at external cervical os, no lesions or other abnormalities Breast: No nipple discharge, no masses palpated bilaterally, no skin changes seen Neuro: No focal deficits Psych: Appropriate mood and affect  ASSESSMENT/PLAN:   Assessment & Plan Screening examination for STI New partner, requests screening. No phlebotomist in house today, patient instructed to go to [facility] for blood draws - HIV Antibody (routine testing w rflx) - HCV Ab w Reflex to Quant PCR - RPR - Cervicovaginal ancillary only - Hepatitis B surface antigen Urinary frequency Reports urinary frequency, no dysuria.  Status post tubal ligation, however requests pregnancy test today, this was negative. - POCT urinalysis dipstick - POCT UA - Microscopic Only  Gastroesophageal reflux disease, unspecified whether esophagitis present Chest pain, unspecified type Possible etiology of chest discomfort given symptoms of acid reflux and low concern for cardiovascular etiology given age and history.  Patient does not swallow pills, will trial liquid Pepcid  - famotidine  (PEPCID ) 40 MG/5ML suspension; Take 2.5 mLs (20 mg total) by mouth  2 (two) times daily.  Dispense: 50 mL; Refill: 0     Rea Raring, MD [facility]

## 2023-11-14 ENCOUNTER — Ambulatory Visit: Payer: Self-pay | Admitting: Family Medicine

## 2023-11-14 ENCOUNTER — Other Ambulatory Visit: Payer: Self-pay | Admitting: Family Medicine

## 2023-11-14 DIAGNOSIS — N3000 Acute cystitis without hematuria: Secondary | ICD-10-CM

## 2023-11-14 DIAGNOSIS — B9689 Other specified bacterial agents as the cause of diseases classified elsewhere: Secondary | ICD-10-CM

## 2023-11-14 LAB — CERVICOVAGINAL ANCILLARY ONLY
Bacterial Vaginitis (gardnerella): POSITIVE — AB
Candida Glabrata: NEGATIVE
Candida Vaginitis: NEGATIVE
Chlamydia: NEGATIVE
Comment: NEGATIVE
Comment: NEGATIVE
Comment: NEGATIVE
Comment: NEGATIVE
Comment: NEGATIVE
Comment: NORMAL
Neisseria Gonorrhea: NEGATIVE
Trichomonas: NEGATIVE

## 2023-11-14 MED ORDER — CEFADROXIL 500 MG/5ML PO SUSR
500.0000 mg | Freq: Two times a day (BID) | ORAL | 0 refills | Status: AC
Start: 2023-11-14 — End: 2023-11-19

## 2023-11-14 MED ORDER — METRONIDAZOLE 0.75 % VA GEL: 1.0000 | Freq: Every day | VAGINAL | 0 refills | Status: AC

## 2023-11-20 ENCOUNTER — Ambulatory Visit: Admitting: Family Medicine

## 2023-11-20 NOTE — Progress Notes (Deleted)
    SUBJECTIVE:   CHIEF COMPLAINT / HPI:   Follow up chest pain/GERD  PERTINENT  PMH / PSH: ***  OBJECTIVE:   There were no vitals taken for this visit.  ***  ASSESSMENT/PLAN:   Assessment & Plan      Lucie Pinal, DO [facility]

## 2023-12-04 ENCOUNTER — Ambulatory Visit: Admitting: Family Medicine

## 2023-12-04 NOTE — Progress Notes (Deleted)
    SUBJECTIVE:   CHIEF COMPLAINT / HPI:   Follow up chest pain/GERD Was instructed to try pepcid  given reflux type symptoms  PERTINENT  PMH / PSH: ***  OBJECTIVE:   There were no vitals taken for this visit.  ***  ASSESSMENT/PLAN:   Assessment & Plan      Lucie Pinal, DO [facility]

## 2024-02-08 ENCOUNTER — Encounter: Payer: Self-pay | Admitting: Family Medicine

## 2024-02-12 ENCOUNTER — Ambulatory Visit: Payer: Self-pay

## 2024-02-12 NOTE — Progress Notes (Deleted)
° ° °  SUBJECTIVE:   CHIEF COMPLAINT / HPI:   Brittney Holmes is a 28 y.o. female presenting for cramps in stomach, back. Also with headache.  ***  PERTINENT  PMH / PSH: ***  OBJECTIVE:   There were no vitals taken for this visit.  ***  ASSESSMENT/PLAN:   Assessment & Plan      Alan Flies, MD [facility]
